# Patient Record
Sex: Female | Born: 1945 | Race: White | Hispanic: No | Smoking: Former smoker
Health system: Southern US, Community
[De-identification: ages and names within clinical notes are randomized; demographics above are authoritative.]

## PROBLEM LIST (undated history)

## (undated) DIAGNOSIS — Z9109 Other allergy status, other than to drugs and biological substances: Secondary | ICD-10-CM

## (undated) DIAGNOSIS — E785 Hyperlipidemia, unspecified: Secondary | ICD-10-CM

## (undated) DIAGNOSIS — F419 Anxiety disorder, unspecified: Secondary | ICD-10-CM

## (undated) DIAGNOSIS — M1990 Unspecified osteoarthritis, unspecified site: Secondary | ICD-10-CM

## (undated) DIAGNOSIS — I499 Cardiac arrhythmia, unspecified: Secondary | ICD-10-CM

## (undated) DIAGNOSIS — D649 Anemia, unspecified: Secondary | ICD-10-CM

## (undated) DIAGNOSIS — C801 Malignant (primary) neoplasm, unspecified: Secondary | ICD-10-CM

## (undated) DIAGNOSIS — Z87440 Personal history of urinary (tract) infections: Secondary | ICD-10-CM

## (undated) DIAGNOSIS — R011 Cardiac murmur, unspecified: Secondary | ICD-10-CM

## (undated) DIAGNOSIS — K219 Gastro-esophageal reflux disease without esophagitis: Secondary | ICD-10-CM

## (undated) DIAGNOSIS — R3989 Other symptoms and signs involving the genitourinary system: Secondary | ICD-10-CM

## (undated) HISTORY — DX: Anemia, unspecified: D64.9

## (undated) HISTORY — DX: Cardiac murmur, unspecified: R01.1

## (undated) HISTORY — PX: BREAST REDUCTION SURGERY: SHX8

## (undated) HISTORY — DX: Other symptoms and signs involving the genitourinary system: R39.89

## (undated) HISTORY — DX: Gastro-esophageal reflux disease without esophagitis: K21.9

## (undated) HISTORY — PX: FACIAL COSMETIC SURGERY: SHX629

## (undated) HISTORY — PX: SKIN SURGERY: SHX2413

## (undated) HISTORY — DX: Other allergy status, other than to drugs and biological substances: Z91.09

## (undated) HISTORY — DX: Hyperlipidemia, unspecified: E78.5

## (undated) HISTORY — DX: Unspecified osteoarthritis, unspecified site: M19.90

## (undated) HISTORY — DX: Anxiety disorder, unspecified: F41.9

## (undated) HISTORY — DX: Personal history of urinary (tract) infections: Z87.440

## (undated) HISTORY — DX: Malignant (primary) neoplasm, unspecified: C80.1

---

## 2008-01-20 LAB — HM PAP SMEAR: HM Pap smear: NEGATIVE

## 2009-08-23 ENCOUNTER — Ambulatory Visit: Payer: Self-pay | Admitting: Cardiovascular Disease

## 2009-08-23 DIAGNOSIS — R002 Palpitations: Secondary | ICD-10-CM | POA: Insufficient documentation

## 2009-08-23 DIAGNOSIS — E785 Hyperlipidemia, unspecified: Secondary | ICD-10-CM | POA: Insufficient documentation

## 2009-08-24 ENCOUNTER — Ambulatory Visit: Payer: Self-pay | Admitting: Cardiovascular Disease

## 2009-09-02 ENCOUNTER — Telehealth: Payer: Self-pay | Admitting: Cardiovascular Disease

## 2009-09-03 LAB — CONVERTED CEMR LAB
ALT: 15 units/L (ref 0–35)
Alkaline Phosphatase: 79 units/L (ref 39–117)
LDL Cholesterol: 140 mg/dL — ABNORMAL HIGH (ref 0–99)
MCHC: 31.3 g/dL (ref 30.0–36.0)
MCV: 96.4 fL (ref 78.0–100.0)
Platelets: 299 10*3/uL (ref 150–400)
Sodium: 138 meq/L (ref 135–145)
Total Bilirubin: 0.8 mg/dL (ref 0.3–1.2)
Total Protein: 6.7 g/dL (ref 6.0–8.3)
Triglycerides: 117 mg/dL (ref <150)
VLDL: 23 mg/dL (ref 0–40)

## 2009-10-07 ENCOUNTER — Encounter: Payer: Self-pay | Admitting: Cardiovascular Disease

## 2010-07-12 NOTE — Progress Notes (Signed)
Summary: RELEASE  RELEASE   Imported By: Frazier Butt Chriscoe 10/07/2009 11:43:23  _____________________________________________________________________  External Attachment:    Type:   Image     Comment:   External Document

## 2010-07-12 NOTE — Assessment & Plan Note (Signed)
Summary: NP6/AMD   Visit Type:  New Patient Referring Provider:  Mariah Milling Primary Provider:  Blythe Stanford  CC:  no complaints.  History of Present Illness: 65 year old woman with past medical history of hyperlipidemia, remote smoking who stopped 25 years ago, tachypalpitations who presents to reestablish care.  Ms. Saldierna states that she has been doing well. She has lost significant weight over the past month following any diet by Dr.Phil. She decreases her carbohydrates and increases her exercise. She rarely has tachypalpitations and has not had to take diltiazem since last year.  She is exercising frequently with no symptoms of shortness of breath, chest pain or neck tightness. Overall she feels well with no complaints.  Preventive Screening-Counseling & Management  Alcohol-Tobacco     Alcohol drinks/day: <1     Smoking Status: quit     Year Quit: 1990  Caffeine-Diet-Exercise     Caffeine use/day: 2-3 (green tea; coffee)     Does Patient Exercise: yes      Drug Use:  no.    Current Medications (verified): 1)  Zyrtec Hives Relief 10 Mg Tabs (Cetirizine Hcl) .Marland Kitchen.. 1 By Mouth Once Daily 2)  Alprazolam 0.5 Mg Tabs (Alprazolam) .... 1/2 By Mouth Once Daily 3)  Cinnamon 500 Mg Caps (Cinnamon) .Marland Kitchen.. 1 By Mouth Once Daily 4)  Cranberry Concentrate 100-3 Mg-Unit Caps (Vitamins C E) .Marland Kitchen.. 1 By Mouth Once Daily 5)  Acidophilus  Caps (Lactobacillus) .Marland Kitchen.. 1 By Mouth Once Daily 6)  Aspirin 81 Mg Tbec (Aspirin) .... Take 2  Tablet By Mouth Daily  Allergies (verified): No Known Drug Allergies  Past History:  Past Medical History: Murmur Hyperlipidemia Allergies Anxiety Arthritis GERD Anemia Constipation Urinary problems  Past Surgical History: face life breast reductione  Family History: Mother: deceased 40; stroke Father: deceased 15: heart problems Brother: living  Social History: Retired  Widowed  Tobacco Use - Former.  Alcohol Use - yes Regular Exercise -  yes Drug Use - no Alcohol drinks/day:  <1 Smoking Status:  quit Caffeine use/day:  2-3 (green tea; coffee) Does Patient Exercise:  yes Drug Use:  no  Review of Systems  The patient denies fever, vision loss, decreased hearing, hoarseness, chest pain, syncope, dyspnea on exertion, peripheral edema, prolonged cough, abdominal pain, incontinence, muscle weakness, depression, and enlarged lymph nodes.    Vital Signs:  Patient profile:   65 year old female Height:      63 inches Weight:      127.25 pounds BMI:     22.62 Pulse rate:   68 / minute Pulse rhythm:   regular BP sitting:   127 / 70  (right arm) Cuff size:   regular  Vitals Entered By: Mercer Pod (August 23, 2009 11:30 AM)  Physical Exam  General:  well-appearing middle-aged woman in no apparent distress, HENT exam is benign, oropharynx is clear, neck is supple with no JVP or carotid bruits,and no murmurs appreciated, lungs are clear to auscultation with no wheezes Rales, abdominal exam is benign, no significant marked edema, pulses are equal and symmetrical in her upper and lower extremities, neurologic exam is nonfocal, skin is warm and dry    EKG  Procedure date:  08/23/2009  Findings:      normal sinus rhythm with rate of 68 beats per minute, sinus arrhythmia, no significant ST or T wave changes.  Impression & Recommendations:  Problem # 1:  HYPERLIPIDEMIA (ICD-272.4) she does have a history of coronary artery disease. Her father had his first MI  at age 62, and died at age 28 from cardiac related complications. Mother had a stroke at age 61.  She currently takes aspirin 162 mg daily. We have asked her to have some lab work done this week and we will forward this to Dr. Elease Hashimoto and it becomes available  Problem # 2:  PALPITATIONS (ICD-785.1) no significant palpitations. She does take diltiazem on an as-needed basis though it does not take it regularly. This does not seem to be significant concern and she  does not have significant symptoms at this time. We will continue her diltiazem as her last prescription is 65 years old.  Her updated medication list for this problem includes:    Aspirin 81 Mg Tbec (Aspirin) .Marland Kitchen... Take 2  tablet by mouth daily    Diltiazem Hcl 30 Mg Tabs (Diltiazem hcl) .Marland Kitchen... 1 tab by mouth as needed for palpitations  Patient Instructions: 1)  Your physician recommends that you schedule a follow-up appointment in: 1 year 2)  Your physician recommends that you return for a FASTING lipid profile: lipids, CMET, CBC, A1C at your earliest convenience Prescriptions: DILTIAZEM HCL 30 MG TABS (DILTIAZEM HCL) 1 tab by mouth as needed for palpitations  #30 x 1   Entered by:   Charlena Cross, RN, BSN   Authorized by:   Dossie Arbour MD   Signed by:   Charlena Cross, RN, BSN on 08/23/2009   Method used:   Electronically to        [facility]* (retail)       [address]       [place], [place]  [address]       Ph: [phone]       Fax: [phone]   RxID:   [id number]

## 2010-07-12 NOTE — Progress Notes (Signed)
Summary: PHI  PHI   Imported By: Harlon Flor 08/23/2009 15:55:07  _____________________________________________________________________  External Attachment:    Type:   Image     Comment:   External Document

## 2010-07-12 NOTE — Progress Notes (Signed)
Summary: LAB WORK  Phone Note Call from Patient Call back at [phone]   Caller: SELF Call For: [facility] Summary of Call: CALLING FOR BLOOD RESULTS FROM LAST WEEK Initial call taken by: Harlon Flor,  September 02, 2009 9:20 AM  Follow-up for Phone Call        pt given results. Charlena Cross RN BSN  Follow-up by: Charlena Cross, RN, BSN,  September 03, 2009 9:33 AM

## 2010-12-27 ENCOUNTER — Encounter: Payer: Self-pay | Admitting: Cardiovascular Disease

## 2012-01-23 ENCOUNTER — Encounter: Payer: Self-pay | Admitting: Cardiovascular Disease

## 2012-01-23 ENCOUNTER — Ambulatory Visit (INDEPENDENT_AMBULATORY_CARE_PROVIDER_SITE_OTHER): Payer: Medicare Other | Admitting: Cardiovascular Disease

## 2012-01-23 VITALS — BP 120/74 | HR 59 | Ht 63.0 in | Wt 130.5 lb

## 2012-01-23 DIAGNOSIS — Z79899 Other long term (current) drug therapy: Secondary | ICD-10-CM

## 2012-01-23 DIAGNOSIS — R5381 Other malaise: Secondary | ICD-10-CM

## 2012-01-23 DIAGNOSIS — R002 Palpitations: Secondary | ICD-10-CM

## 2012-01-23 DIAGNOSIS — R5383 Other fatigue: Secondary | ICD-10-CM

## 2012-01-23 DIAGNOSIS — R079 Chest pain, unspecified: Secondary | ICD-10-CM | POA: Insufficient documentation

## 2012-01-23 DIAGNOSIS — E785 Hyperlipidemia, unspecified: Secondary | ICD-10-CM

## 2012-01-23 NOTE — Assessment & Plan Note (Signed)
We will recheck her cholesterol today. We have suggested red yeast rice. She does not want a statin.

## 2012-01-23 NOTE — Progress Notes (Signed)
Patient ID: Isabel Gonzalez, female    DOB: [birth year], 66 y.o.   MRN: [id number]  HPI Comments: 66 year old woman with past medical history of hyperlipidemia, remote smoking who stopped 25 years ago, tachypalpitations who presents for routine followup.   Isabel Gonzalez states that she has been doing well.. She rarely has tachypalpitations and takes diltiazem rarely. She has been having some chest pain episodes on the left that start when she begins exercise. They go away after several minutes and she is able to exercise for prolonged period of time with no symptoms. She has started a proton pump inhibitor in the past several days. She has been walking for the past several weeks and prior to that was sedentary with mild weight gain. She does have significant arthritis  EKG shows normal sinus rhythm with rate 59 beats per minute with no significant ST or T wave changes    Outpatient Encounter Prescriptions as of 01/23/2012  Medication Sig Dispense Refill  . ALPRAZolam (XANAX) 0.5 MG tablet Take 0.25 mg by mouth daily as needed.       Marland Kitchen aspirin 81 MG EC tablet Take 162 mg by mouth daily.        . cetirizine (ZYRTEC) 10 MG tablet Take 10 mg by mouth daily.        . Cinnamon 500 MG capsule Take 500 mg by mouth daily.        Marland Kitchen CRANBERRY PO Take by mouth daily.      Marland Kitchen diltiazem (CARDIZEM) 30 MG tablet Take 30 mg by mouth as needed.       . fluticasone (FLONASE) 50 MCG/ACT nasal spray Place 2 sprays into the nose as needed.      . Lactobacillus (ACIDOPHILUS) CAPS Take 1 capsule by mouth daily.        Marland Kitchen MAGNESIUM CARBONATE PO Take by mouth as needed.      . Melatonin 5 MG TABS Take 5 mg by mouth daily.      . Psyllium (METAMUCIL PO) Take by mouth daily.      . Vitamins C E (CRANBERRY CONCENTRATE) 100-3 MG-UNIT CAPS Take 1 capsule by mouth daily.          Review of Systems  Constitutional: Negative.   HENT: Negative.   Eyes: Negative.   Respiratory: Negative.   Cardiovascular: Positive for  chest pain and palpitations.  Gastrointestinal: Negative.   Musculoskeletal: Negative.   Skin: Negative.   Neurological: Negative.   Hematological: Negative.   Psychiatric/Behavioral: Negative.   All other systems reviewed and are negative.    BP 120/74  Pulse 59  Ht 5\' 3"  (1.6 m)  Wt 130 lb 8 oz (59.194 kg)  BMI 23.12 kg/m2  Physical Exam  Nursing note and vitals reviewed. Constitutional: She is oriented to person, place, and time. She appears well-developed and well-nourished.  HENT:  Head: Normocephalic.  Nose: Nose normal.  Mouth/Throat: Oropharynx is clear and moist.  Eyes: Conjunctivae are normal. Pupils are equal, round, and reactive to light.  Neck: Normal range of motion. Neck supple. No JVD present.  Cardiovascular: Normal rate, regular rhythm, S1 normal, S2 normal, normal heart sounds and intact distal pulses.  Exam reveals no gallop and no friction rub.   No murmur heard. Pulmonary/Chest: Effort normal and breath sounds normal. No respiratory distress. She has no wheezes. She has no rales. She exhibits no tenderness.  Abdominal: Soft. Bowel sounds are normal. She exhibits no distension. There is no tenderness.  Musculoskeletal: Normal  range of motion. She exhibits no edema and no tenderness.  Lymphadenopathy:    She has no cervical adenopathy.  Neurological: She is alert and oriented to person, place, and time. Coordination normal.  Skin: Skin is warm and dry. No rash noted. No erythema.  Psychiatric: She has a normal mood and affect. Her behavior is normal. Judgment and thought content normal.         Assessment and Plan

## 2012-01-23 NOTE — Assessment & Plan Note (Signed)
Rare palpitations.

## 2012-01-23 NOTE — Patient Instructions (Addendum)
You are doing well. No medication changes were made.  Try Red Yeast Rice, plant sterols Please call the office if you have more chest pain  Please call us if you have new issues that need to be addressed before your next appt.  Your physician wants you to follow-up in: 12 months.  You will receive a reminder letter in the mail two months in advance. If you don't receive a letter, please call our office to schedule the follow-up appointment.

## 2012-01-23 NOTE — Assessment & Plan Note (Addendum)
We have suggested that she call our office if she has worsening left-sided chest pain. We will do a routine treadmill study. Symptoms are atypical.

## 2012-01-24 LAB — BASIC METABOLIC PANEL
BUN/Creatinine Ratio: 17 (ref 11–26)
BUN: 11 mg/dL (ref 8–27)
CO2: 25 mmol/L (ref 19–28)
Calcium: 9.8 mg/dL (ref 8.6–10.2)
Chloride: 106 mmol/L (ref 97–108)
Creatinine, Ser: 0.66 mg/dL (ref 0.57–1.00)
Glucose: 101 mg/dL — ABNORMAL HIGH (ref 65–99)

## 2012-01-24 LAB — CBC
MCH: 31.8 pg (ref 26.6–33.0)
Platelets: 298 10*3/uL (ref 140–415)
RBC: 4.53 x10E6/uL (ref 3.77–5.28)

## 2012-01-24 LAB — HEPATIC FUNCTION PANEL
ALT: 25 IU/L (ref 0–32)
AST: 24 IU/L (ref 0–40)
Albumin: 4.6 g/dL (ref 3.6–4.8)
Alkaline Phosphatase: 70 IU/L (ref 47–112)
Total Bilirubin: 0.9 mg/dL (ref 0.0–1.2)

## 2012-01-24 LAB — LIPID PANEL
Cholesterol, Total: 257 mg/dL — ABNORMAL HIGH (ref 100–199)
Triglycerides: 159 mg/dL — ABNORMAL HIGH (ref 0–149)
VLDL Cholesterol Cal: 32 mg/dL (ref 5–40)

## 2012-01-26 ENCOUNTER — Telehealth: Payer: Self-pay | Admitting: *Deleted

## 2012-01-26 ENCOUNTER — Ambulatory Visit: Payer: Self-pay | Admitting: Family Medicine

## 2012-01-26 MED ORDER — ATORVASTATIN CALCIUM 10 MG PO TABS: 10.0000 mg | ORAL_TABLET | Freq: Every day | ORAL | Status: DC

## 2012-01-26 NOTE — Telephone Encounter (Signed)
Pt aware of lab results.  She does not want to take the red yeast rice " because my friends say it has given them hot flashes and I am over that!" She states she would be willing to take a statin. Dr. Mariah Milling reviewed and recommended Atorvastatin 10mg   With fasting labs in 3 months. Pt agrees with this and will take medication at bedtime. She will call back if any symptoms occur. Mylo Red RN

## 2012-01-26 NOTE — Telephone Encounter (Signed)
Message copied by Barrie Folk on Fri Jan 26, 2012  4:49 PM ------      Message from: Antonieta Iba      Created: Fri Jan 26, 2012  1:28 PM       All lab work looks good except for very high cholesterol      Needs a statin but has refused in the past      At least high dose red yeast rice,      May have to add other non-statin medications (zetia, fenofibrate, welchol, etc)

## 2012-04-29 ENCOUNTER — Ambulatory Visit: Payer: Self-pay | Admitting: Gastroenterology

## 2012-04-29 LAB — HM COLONOSCOPY

## 2013-01-06 ENCOUNTER — Ambulatory Visit: Payer: Self-pay | Admitting: Gastroenterology

## 2013-01-17 ENCOUNTER — Ambulatory Visit: Payer: Self-pay | Admitting: Gastroenterology

## 2013-01-17 ENCOUNTER — Encounter: Payer: Self-pay | Admitting: Cardiovascular Disease

## 2013-01-17 ENCOUNTER — Ambulatory Visit (INDEPENDENT_AMBULATORY_CARE_PROVIDER_SITE_OTHER): Payer: Medicare Other | Admitting: Cardiovascular Disease

## 2013-01-17 VITALS — BP 142/84 | HR 55 | Ht 63.0 in | Wt 129.2 lb

## 2013-01-17 DIAGNOSIS — R109 Unspecified abdominal pain: Secondary | ICD-10-CM | POA: Insufficient documentation

## 2013-01-17 DIAGNOSIS — E785 Hyperlipidemia, unspecified: Secondary | ICD-10-CM

## 2013-01-17 DIAGNOSIS — R079 Chest pain, unspecified: Secondary | ICD-10-CM

## 2013-01-17 DIAGNOSIS — R002 Palpitations: Secondary | ICD-10-CM

## 2013-01-17 MED ORDER — DILTIAZEM HCL 30 MG PO TABS: 30.0000 mg | ORAL_TABLET | Freq: Three times a day (TID) | ORAL | Status: DC | PRN

## 2013-01-17 NOTE — Assessment & Plan Note (Signed)
She takes diltiazem very sparingly for episodes of palpitations

## 2013-01-17 NOTE — Assessment & Plan Note (Signed)
She reports chronic abdominal discomfort. 2 CTs of her abdomen recently both showing her mesenteric vessels are open. Mild to moderate aortic plaquing. Atherosclerosis not likely contributing to her abdominal discomfort. This is a incidental finding that will help guide her cholesterol management.

## 2013-01-17 NOTE — Assessment & Plan Note (Signed)
CT scan from [facility] was evaluated. Images were visualized and shown to the patient. She was able to see her aortic plaque. She has not been able to tolerate Lipitor or Crestor. We will try vytorin 10/20 mg one half pill for several weeks before titrating up to a full pill.

## 2013-01-17 NOTE — Patient Instructions (Addendum)
You are doing well.  Please start vytorin 10/20 daily (take 1/2 pill daily for a few weeks) If no side effects, increase to a full pill  We will recheck cholesterol in three months  Please call us if you have new issues that need to be addressed before your next appt.  Your physician wants you to follow-up in: 12 months.  You will receive a reminder letter in the mail two months in advance. If you don't receive a letter, please call our office to schedule the follow-up appointment.

## 2013-01-17 NOTE — Progress Notes (Signed)
Patient ID: Isabel Gonzalez, female    DOB: 1945-12-18, 67 y.o.   MRN: [id number]  HPI Comments: 67 year old woman with past medical history of hyperlipidemia, remote smoking who stopped 25 years ago, tachypalpitations who presents for routine followup.   Isabel Gonzalez states that she has been doing well. She rarely has tachypalpitations and takes diltiazem rarely. She does report having 3 episodes of severe nausea vomiting and diarrhea, each episode occurred after eating out   at a restaurant, and eating fast . Symptoms resolved after one to 3 hours . She reports having EGD and colonoscopy November 2013 which by her report was benign. Since that time, she has had an aching in her abdomen that seems to wax and wane .  She was sent for CT scan of her chest abdomen pelvis.   CT scan 01/17/2013 showed mild to moderate descending aortic atherosclerotic plaque otherwise mesenteric vessels for open  CT scan of abdomen pelvis with contrast at [facility] also documented the same thing. This was done in on 01/13/2013 (this was done for right upper part her pain) She states that she has had ultrasound of her gallbladder. Initially symptoms were felt to be from gallstones. She has seen Dr. Mechele Collin and reports that they do not think she has gallstones  EKG shows normal sinus rhythm with rate 55 beats per minute with no significant ST or T wave changes, PVC noted     Outpatient Encounter Prescriptions as of 01/17/2013  Medication Sig Dispense Refill  . ALPRAZolam (XANAX) 0.5 MG tablet Take 0.25 mg by mouth daily as needed.       Marland Kitchen aspirin 81 MG EC tablet Take 162 mg by mouth daily.        . cetirizine (ZYRTEC) 10 MG tablet Take 10 mg by mouth daily.        . Cinnamon 500 MG capsule Take 1,000 mg by mouth daily.       Marland Kitchen CRANBERRY PO Take by mouth daily.      Marland Kitchen diltiazem (CARDIZEM) 30 MG tablet Take 30 mg by mouth as needed.       . fluticasone (FLONASE) 50 MCG/ACT nasal spray Place 2 sprays into the nose as  needed.      . Melatonin 5 MG TABS Take 5 mg by mouth daily.      . Vitamins C E (CRANBERRY CONCENTRATE) 100-3 MG-UNIT CAPS Take 1 capsule by mouth daily.        . [DISCONTINUED] atorvastatin (LIPITOR) 10 MG tablet Take 1 tablet (10 mg total) by mouth daily.  30 tablet  6  . [DISCONTINUED] Lactobacillus (ACIDOPHILUS) CAPS Take 1 capsule by mouth daily.        . [DISCONTINUED] MAGNESIUM CARBONATE PO Take by mouth as needed.      . [DISCONTINUED] Psyllium (METAMUCIL PO) Take by mouth daily.       No facility-administered encounter medications on file as of 01/17/2013.    Review of Systems  Constitutional: Negative.   HENT: Negative.   Eyes: Negative.   Respiratory: Negative.   Cardiovascular: Negative.   Gastrointestinal: Positive for abdominal pain.  Musculoskeletal: Negative.   Skin: Negative.   Neurological: Negative.   Psychiatric/Behavioral: Negative.   All other systems reviewed and are negative.    BP 142/84  Pulse 55  Ht 5\' 3"  (1.6 m)  Wt 129 lb 4 oz (58.627 kg)  BMI 22.9 kg/m2  Physical Exam  Nursing note and vitals reviewed. Constitutional: She is oriented to  person, place, and time. She appears well-developed and well-nourished.  HENT:  Head: Normocephalic.  Nose: Nose normal.  Mouth/Throat: Oropharynx is clear and moist.  Eyes: Conjunctivae are normal. Pupils are equal, round, and reactive to light.  Neck: Normal range of motion. Neck supple. No JVD present.  Cardiovascular: Normal rate, regular rhythm, S1 normal, S2 normal, normal heart sounds and intact distal pulses.  Exam reveals no gallop and no friction rub.   No murmur heard. Pulmonary/Chest: Effort normal and breath sounds normal. No respiratory distress. She has no wheezes. She has no rales. She exhibits no tenderness.  Abdominal: Soft. Bowel sounds are normal. She exhibits no distension. There is no tenderness.  Musculoskeletal: Normal range of motion. She exhibits no edema and no tenderness.   Lymphadenopathy:    She has no cervical adenopathy.  Neurological: She is alert and oriented to person, place, and time. Coordination normal.  Skin: Skin is warm and dry. No rash noted. No erythema.  Psychiatric: She has a normal mood and affect. Her behavior is normal. Judgment and thought content normal.    Assessment and Plan

## 2013-04-14 ENCOUNTER — Telehealth: Payer: Self-pay | Admitting: *Deleted

## 2013-04-14 DIAGNOSIS — E785 Hyperlipidemia, unspecified: Secondary | ICD-10-CM

## 2013-04-14 NOTE — Telephone Encounter (Signed)
She has tried Crestor, Lipitor, Vytorin with side effects  Could try pravastatin 20 mg daily Cut the pill in half to start

## 2013-04-14 NOTE — Telephone Encounter (Signed)
Tried the statins that Dr. Mariah Milling gave her and they made her feel bad, achy and tired. Please call and advise.

## 2013-04-15 MED ORDER — PRAVASTATIN SODIUM 20 MG PO TABS: 20.0000 mg | ORAL_TABLET | Freq: Every evening | ORAL | Status: DC

## 2013-04-15 NOTE — Telephone Encounter (Signed)
Spoke w/ pt.  She is very interested in trying pravastatin, as a friend of her tried it with success. Informed her that i was sending rx for pravastatin 20 mg to [facility] and for her to cut in 1/2, see how she does and move up to a whole pill. Pt to call back and let me know how this is working for her.

## 2013-04-17 ENCOUNTER — Other Ambulatory Visit: Payer: Self-pay

## 2013-04-24 ENCOUNTER — Ambulatory Visit: Payer: Self-pay | Admitting: Gastroenterology

## 2013-12-10 LAB — BASIC METABOLIC PANEL
BUN: 14 mg/dL (ref 4–21)
Creatinine: 0.6 mg/dL (ref 0.5–1.1)
GLUCOSE: 95 mg/dL
POTASSIUM: 4.8 mmol/L (ref 3.4–5.3)
SODIUM: 139 mmol/L (ref 137–147)

## 2013-12-10 LAB — HEPATIC FUNCTION PANEL
ALT: 21 U/L (ref 7–35)
AST: 21 U/L (ref 13–35)

## 2013-12-10 LAB — HEMOGLOBIN A1C: Hgb A1c MFr Bld: 5.8 % (ref 4.0–6.0)

## 2014-11-12 ENCOUNTER — Telehealth: Payer: Self-pay | Admitting: Family Medicine

## 2014-11-12 NOTE — Telephone Encounter (Signed)
[place] 11/02/2014 and broke your humerus/right arm.  Pt is a doctors name to see a orthopedic doctor here.  Pt states she just needs a doctor name and # that she can give [place] doctor [place] the beach if possible.  CB#[phone]

## 2014-11-12 NOTE — Telephone Encounter (Signed)
Left message to call back. Do not understand message.

## 2014-12-07 ENCOUNTER — Ambulatory Visit: Payer: Medicare PPO

## 2014-12-08 ENCOUNTER — Ambulatory Visit: Payer: Medicare PPO | Attending: Orthopedic Surgery | Admitting: Occupational Therapy

## 2014-12-08 ENCOUNTER — Encounter: Payer: Self-pay | Admitting: Occupational Therapy

## 2014-12-08 DIAGNOSIS — M2560 Stiffness of unspecified joint, not elsewhere classified: Secondary | ICD-10-CM | POA: Diagnosis present

## 2014-12-08 DIAGNOSIS — IMO0002 Reserved for concepts with insufficient information to code with codable children: Secondary | ICD-10-CM

## 2014-12-08 DIAGNOSIS — R609 Edema, unspecified: Secondary | ICD-10-CM | POA: Insufficient documentation

## 2014-12-08 NOTE — Therapy (Signed)
[facility] [address], [place], [address] Phone: [phone]   Fax:  [phone]  Occupational Therapy Treatment  Patient Details  Name: Isabel Gonzalez MRN: [id number] Date of Birth: [birth year] Referring Provider:  Justice Britain, MD  Encounter Date: 12/08/2014      OT End of Session - 12/08/14 2046    Visit Number 1   Number of Visits 1   Date for OT Re-Evaluation 12/08/14   OT Start Time 1300   OT Stop Time 1358   OT Time Calculation (min) 58 min   Activity Tolerance Patient tolerated treatment well   Behavior During Therapy [facility] for tasks assessed/performed      Past Medical History  Diagnosis Date  . Murmur   . Hyperlipidemia   . Environmental allergies   . Anxiety   . Arthritis   . GERD (gastroesophageal reflux disease)   . Anemia   . Constipation   . Urinary problem   . Cancer     squamous cell  . History of bladder infections     Past Surgical History  Procedure Laterality Date  . Facial cosmetic surgery    . Breast reduction surgery    . Skin surgery      squamous cell    There were no vitals filed for this visit.  Visit Diagnosis:  Edema - Plan: Ot plan of care cert/re-cert  Stiffness of extremity - Plan: Ot plan of care cert/re-cert      Subjective Assessment - 12/08/14 2033    Subjective  I fractured my humerus 11/02/14 and in sling - hard time getting my bra on , slling myself- open jar, writing, cannot drive- swelling in hand to elbow was worse when I seen him    Patient Stated Goals Want to do things for myself- use my arm - get the swelling in my hand to elbow better that I can use it more             [facility] OT Assessment - 12/08/14 0001    Assessment   Diagnosis Closed nondisplaced spriral humerus sharft fx   Onset Date 11/02/14   Assessment Pt refer to ADL's retrng and edema management for R dominant hand to elbow - pt report needing assistance for  putting on bra, shirt, sling,, open jars, writing , shower and putting on toothbrush  - she do have min to mod edema in R hand and wrist - moderate at elbow with some fibrosis present because of long standing edema -   Precautions   Precaution Comments sling can dangle arm out of brace to use in ADL's    Required Braces or Orthoses Sling;Other Brace/Splint   Restrictions   Other Position/Activity Restrictions NWB on R UE    Home  Environment   Lives With Alone   Prior Function   Vocation Retired   Leisure Very active - walked daily 3 miles, play bridge, golf, reading , sudoku , - had normal AROM prior to fall    ADL   Upper Body Dressing Patient Percentage 30%   Lower Body Dressing Independent   ADL comments Shower standing in walk in shower - recommend shower chair - she has hand held shower    Edema   Edema moderate at elbow , and min to mod at hand and wrist      Pt was educated in donning sling, brace and shirt one hand technique - moved velcro to  anterior for her to reach better  And HEP for AROM to wrist, tendon gliding and opposition ed on   Edema control - she can do manual lymph drainage tech from wrist to elbow, hand to elbow and then finger to elbow  Isotoner glove fitted and bandage one 6 cm short stretch bandage to do from hand to elbow about 2-3 x day for 2 hrs                      OT Education - 12/08/14 2045    Education provided Yes   Education Details HEP and ADL's    Person(s) Educated Patient   Methods Explanation;Demonstration;Verbal cues;Handout   Comprehension Verbalized understanding;Returned demonstration;Verbal cues required          OT Short Term Goals - 12/08/14 2054    OT SHORT TERM GOAL #1   Title Pt to demo understanding of HEP for decreasing edema and increase ROM at R wrist and digits    Status Achieved           OT Long Term Goals - 12/08/14 2056    OT LONG TERM GOAL #1   Title Pt to be modified ind in donning  shirt  and brace/sling    Status Achieved               Plan - 12/08/14 2047    Clinical Impression Statement Pt was  evaluated and educated on edema control techniques for R  elbow to hand edema , educated on AROM to hand and wrist  as well as ADL's retrng and pt was modified ind in UB dressing at end of session and  edema control and AROM to wrist/fingers    Pt will benefit from skilled therapeutic intervention in order to improve on the following deficits (Retired) Impaired flexibility;Decreased mobility;Impaired UE functional use   Rehab Potential Good   OT Frequency One time visit   OT Treatment/Interventions Self-care/ADL training;Manual Therapy;Compression bandaging;Patient/family education;Therapeutic exercise   OT Home Exercise Plan see pt instruction   Consulted and Agree with Plan of Care Patient        Problem List Patient Active Problem List   Diagnosis Date Noted  . Abdominal discomfort 01/17/2013  . Chest pain 01/23/2012  . HYPERLIPIDEMIA 08/23/2009  . PALPITATIONS 08/23/2009    Rosalyn Gess OTR/L, CLT 12/08/2014, 9:01 PM  [facility] [address] [place] [address] [place], [address] Phone: [phone]   Fax:  [phone]

## 2014-12-08 NOTE — Patient Instructions (Addendum)
Pt educated on modify independent donning sling and brace - moved velcro Also donning and doffing shirt   AROM wrist in all planes with forearm supported  Tendon gliding and opposition   Elbow  extention AROM against wall with shoulder in brace and against wall to prevent shoulder ROM  -after warm shower - gentle   Ed on edema massage of forearm distal to proximal  Then hand to elbow , then fingers to elbow   Fitted with isotoner glove to wear at night time and some during day   And gentle compression wrap with  6 cm short stretch bandage from hand to elbow - 2-3 x day for about 2hrs

## 2014-12-17 ENCOUNTER — Ambulatory Visit: Payer: Medicare PPO | Admitting: Occupational Therapy

## 2015-02-16 ENCOUNTER — Other Ambulatory Visit: Payer: Self-pay | Admitting: Family Medicine

## 2015-02-16 MED ORDER — CIPROFLOXACIN HCL 500 MG PO TABS: 500.0000 mg | ORAL_TABLET | Freq: Two times a day (BID) | ORAL | Status: DC

## 2015-03-18 DIAGNOSIS — F411 Generalized anxiety disorder: Secondary | ICD-10-CM | POA: Insufficient documentation

## 2015-03-18 DIAGNOSIS — R109 Unspecified abdominal pain: Secondary | ICD-10-CM | POA: Insufficient documentation

## 2015-03-18 DIAGNOSIS — Z6823 Body mass index (BMI) 23.0-23.9, adult: Secondary | ICD-10-CM | POA: Insufficient documentation

## 2015-03-18 DIAGNOSIS — R739 Hyperglycemia, unspecified: Secondary | ICD-10-CM | POA: Insufficient documentation

## 2015-03-18 DIAGNOSIS — R002 Palpitations: Secondary | ICD-10-CM | POA: Insufficient documentation

## 2015-03-18 DIAGNOSIS — H409 Unspecified glaucoma: Secondary | ICD-10-CM | POA: Insufficient documentation

## 2015-03-18 DIAGNOSIS — K219 Gastro-esophageal reflux disease without esophagitis: Secondary | ICD-10-CM | POA: Insufficient documentation

## 2015-03-18 DIAGNOSIS — M719 Bursopathy, unspecified: Secondary | ICD-10-CM | POA: Insufficient documentation

## 2015-03-18 DIAGNOSIS — E7800 Pure hypercholesterolemia, unspecified: Secondary | ICD-10-CM | POA: Insufficient documentation

## 2015-03-18 DIAGNOSIS — M1990 Unspecified osteoarthritis, unspecified site: Secondary | ICD-10-CM | POA: Insufficient documentation

## 2015-03-18 DIAGNOSIS — Z833 Family history of diabetes mellitus: Secondary | ICD-10-CM | POA: Insufficient documentation

## 2015-03-18 DIAGNOSIS — J309 Allergic rhinitis, unspecified: Secondary | ICD-10-CM | POA: Insufficient documentation

## 2015-03-18 DIAGNOSIS — F419 Anxiety disorder, unspecified: Secondary | ICD-10-CM | POA: Insufficient documentation

## 2015-03-18 DIAGNOSIS — G43909 Migraine, unspecified, not intractable, without status migrainosus: Secondary | ICD-10-CM | POA: Insufficient documentation

## 2015-03-19 ENCOUNTER — Encounter: Payer: Self-pay | Admitting: Family Medicine

## 2015-03-19 ENCOUNTER — Ambulatory Visit (INDEPENDENT_AMBULATORY_CARE_PROVIDER_SITE_OTHER): Payer: Medicare PPO | Admitting: Family Medicine

## 2015-03-19 VITALS — BP 100/60 | HR 74 | Temp 97.5°F | Resp 16 | Ht 62.0 in | Wt 128.0 lb

## 2015-03-19 DIAGNOSIS — S42301G Unspecified fracture of shaft of humerus, right arm, subsequent encounter for fracture with delayed healing: Secondary | ICD-10-CM | POA: Diagnosis not present

## 2015-03-19 DIAGNOSIS — R202 Paresthesia of skin: Secondary | ICD-10-CM | POA: Diagnosis not present

## 2015-03-19 DIAGNOSIS — R739 Hyperglycemia, unspecified: Secondary | ICD-10-CM

## 2015-03-19 DIAGNOSIS — Z13820 Encounter for screening for osteoporosis: Secondary | ICD-10-CM

## 2015-03-19 DIAGNOSIS — S42309A Unspecified fracture of shaft of humerus, unspecified arm, initial encounter for closed fracture: Secondary | ICD-10-CM | POA: Insufficient documentation

## 2015-03-19 DIAGNOSIS — Z0000 Encounter for general adult medical examination without abnormal findings: Secondary | ICD-10-CM | POA: Diagnosis not present

## 2015-03-19 DIAGNOSIS — E7800 Pure hypercholesterolemia, unspecified: Secondary | ICD-10-CM

## 2015-03-19 NOTE — Progress Notes (Signed)
Patient ID: Isabel Gonzalez, female   DOB: [birth year], 69 y.o.   MRN: [id number]        Patient: Isabel Gonzalez, Female    DOB: 08-31-1945, 69 y.o.   MRN: [id number] Visit Date: 03/19/2015  Today's Provider: Margarita Rana, MD   Chief Complaint  Patient presents with  . Medicare Wellness   Subjective:    Annual wellness visit Isabel Gonzalez is a 69 y.o. female. She feels well. She reports exercising none. She reports she is sleeping fairly well.  Patient reports right arm pain due to fracture.  12/10/13 CPE 01/20/08 Pap-neg 04/29/12 Colonoscopy-diverticulosis 12/24/03 EKG  Lab Results  Component Value Date   WBC 3.8* 01/23/2012   HGB 14.4 01/23/2012   HCT 43.3 01/23/2012   PLT 298 01/23/2012   GLUCOSE 101* 01/23/2012   CHOL 257* 01/23/2012   TRIG 159* 01/23/2012   HDL 61 01/23/2012   LDLCALC 164* 01/23/2012   ALT 21 12/10/2013   AST 21 12/10/2013   NA 139 12/10/2013   K 4.8 12/10/2013   CL 106 01/23/2012   CREATININE 0.6 12/10/2013   BUN 14 12/10/2013   CO2 25 01/23/2012   HGBA1C 5.8 12/10/2013    -----------------------------------------------------------   Review of Systems  Constitutional: Negative.   HENT: Negative.   Eyes: Negative.   Respiratory: Negative.   Cardiovascular: Negative.   Gastrointestinal: Negative.   Endocrine: Negative.   Genitourinary: Negative.   Musculoskeletal: Positive for joint swelling.  Allergic/Immunologic: Positive for environmental allergies.  Neurological: Positive for numbness.  Hematological: Negative.   Psychiatric/Behavioral: Negative.     Social History   Social History  . Marital Status: Widowed    Spouse Name: N/A  . Number of Children: N/A  . Years of Education: N/A   Occupational History  . retired    Social History Main Topics  . Smoking status: Former Smoker -- 0.25 packs/day for 20 years    Quit date: 01/17/2013  . Smokeless tobacco: Never Used  . Alcohol Use: 1.2 oz/week      2 Glasses of wine per week     Comment: ocassionally  . Drug Use: No  . Sexual Activity: Not on file   Other Topics Concern  . Not on file   Social History Narrative   Regular exercise          Patient Active Problem List   Diagnosis Date Noted  . Abdominal pain 03/18/2015  . Allergic rhinitis 03/18/2015  . Anxiety 03/18/2015  . Arthritis 03/18/2015  . Body mass index (BMI) of 23.0-23.9 in adult 03/18/2015  . Bursitis 03/18/2015  . Family history of diabetes mellitus 03/18/2015  . Acid reflux 03/18/2015  . Glaucoma 03/18/2015  . Hypercholesteremia 03/18/2015  . Blood glucose elevated 03/18/2015  . Headache, migraine 03/18/2015  . Awareness of heartbeats 03/18/2015  . Abdominal discomfort 01/17/2013  . Chest pain 01/23/2012  . HYPERLIPIDEMIA 08/23/2009  . PALPITATIONS 08/23/2009    Past Surgical History  Procedure Laterality Date  . Facial cosmetic surgery    . Breast reduction surgery    . Skin surgery      squamous cell    Her family history includes Diabetes in her brother; Stroke (age of onset: 44) in her mother.    Previous Medications   ALPRAZOLAM (XANAX) 0.5 MG TABLET    Take 0.25 mg by mouth daily as needed.    ASPIRIN 81 MG EC TABLET    Take 162 mg by mouth daily.  CALCIUM-VITAMIN D (CALTRATE 600 PLUS-VIT D PO)    Take 1 tablet by mouth 2 (two) times daily.   CETIRIZINE (ZYRTEC) 10 MG TABLET    Take 10 mg by mouth daily.     DILTIAZEM (CARDIZEM) 30 MG TABLET    Take 1 tablet (30 mg total) by mouth 3 (three) times daily as needed.   FLUTICASONE (FLONASE) 50 MCG/ACT NASAL SPRAY    Place 2 sprays into the nose as needed.   IBUPROFEN (ADVIL,MOTRIN) 200 MG TABLET    Take 200 mg by mouth every 8 (eight) hours as needed.   VITAMIN D, CHOLECALCIFEROL, 1000 UNITS TABS    Take 1 tablet by mouth 2 (two) times daily.   VITAMIN D, ERGOCALCIFEROL, (DRISDOL) 50000 UNITS CAPS CAPSULE    Take 50,000 Units by mouth every 7 (seven) days.   VITAMINS C E (CRANBERRY  CONCENTRATE) 100-3 MG-UNIT CAPS    Take 1 capsule by mouth daily.      Patient Care Team: Margarita Rana, MD as PCP - General (Family Medicine)     Objective:   Vitals: BP 100/60 mmHg  Pulse 74  Temp(Src) 97.5 F (36.4 C) (Oral)  Resp 16  Ht 5\' 2"  (1.575 m)  Wt 128 lb (58.06 kg)  BMI 23.41 kg/m2  Physical Exam  Constitutional: She is oriented to person, place, and time. She appears well-developed and well-nourished.  HENT:  Head: Normocephalic and atraumatic.  Right Ear: Tympanic membrane, external ear and ear canal normal.  Left Ear: Tympanic membrane, external ear and ear canal normal.  Nose: Nose normal.  Mouth/Throat: Uvula is midline, oropharynx is clear and moist and mucous membranes are normal.  Eyes: Conjunctivae, EOM and lids are normal. Pupils are equal, round, and reactive to light.  Neck: Trachea normal and normal range of motion. Neck supple. Carotid bruit is not present. No thyroid mass and no thyromegaly present.  Cardiovascular: Normal rate, regular rhythm and normal heart sounds.   Pulmonary/Chest: Effort normal and breath sounds normal.  Abdominal: Soft. Normal appearance and bowel sounds are normal. There is no hepatosplenomegaly. There is no tenderness.  Genitourinary: No breast swelling, tenderness or discharge.  Musculoskeletal: Normal range of motion.  Lymphadenopathy:    She has no cervical adenopathy.    She has no axillary adenopathy.  Neurological: She is alert and oriented to person, place, and time. She has normal strength. No cranial nerve deficit.  Skin: Skin is warm, dry and intact.  Psychiatric: She has a normal mood and affect. Her speech is normal and behavior is normal. Judgment and thought content normal. Cognition and memory are normal.    Activities of Daily Living In your present state of health, do you have any difficulty performing the following activities: 03/19/2015  Hearing? N  Vision? N  Difficulty concentrating or making  decisions? N  Walking or climbing stairs? N  Dressing or bathing? N  Doing errands, shopping? N    Fall Risk Assessment Fall Risk  03/19/2015  Falls in the past year? Yes  Number falls in past yr: 1  Injury with Fall? Yes     Depression Screen PHQ 2/9 Scores 03/19/2015  PHQ - 2 Score 0    Cognitive Testing - 6-CIT  Correct? Score   What year is it? yes 0 0 or 4  What month is it? yes 0 0 or 3  Memorize:    Pia Mau,  [address],  [address]  [place],      What time  is it? (within 1 hour) yes 0 0 or 3  Count backwards from 20 yes 0 0, 2, or 4  Name the months of the year yes 0 0, 2, or 4  Repeat name & address above yes 0 0, 2, 4, 6, 8, or 10       TOTAL SCORE  0/28   Interpretation:  Normal  Normal (0-7) Abnormal (8-28)       Assessment & Plan:     Annual Wellness Visit  Reviewed patient's Family Medical History Reviewed and updated list of patient's medical providers Assessment of cognitive impairment was done Assessed patient's functional ability Established a written schedule for health screening [place] Completed and Reviewed  Exercise Activities and Dietary recommendations Goals    . Exercise 150 minutes per week (moderate activity)       Immunization History  Administered Date(s) Administered  . Pneumococcal Conjugate-13 12/10/2013  . Pneumococcal Polysaccharide-23 06/02/2010  . Td 06/29/2003  . Zoster 04/18/2006    Health Maintenance  Topic Date Due  . Hepatitis C Screening  06[birth year]02/45  . TETANUS/TDAP  08/04/1963  . MAMMOGRAM  08/03/1994  . ZOSTAVAX  08/03/2004  . DEXA SCAN  08/03/2009  . PNA vac Low Risk Adult (1 of 2 - PCV13) 08/03/2009  . INFLUENZA VACCINE  01/11/2015  . COLONOSCOPY  04/29/2022         1. Medicare annual wellness visit, subsequent Stable. Patient advised to continue eating healthy and exercise daily.  2. Osteoporosis screening - DG Bone Density; Future  3. Blood glucose elevated -  Comprehensive metabolic panel - Hemoglobin A1c  4. Hypercholesteremia - Lipid Panel With LDL/HDL Ratio - TSH  5. Paresthesia of right foot - Vitamin B12  6. Humerus fracture, right, with delayed healing, subsequent encounter S/P fall , May 23,2016. Patient advised to F/U with ortho in [place].     Patient seen and examined by Dr. Jerrell Belfast, and note scribed by Philbert Riser. Dimas, CMA.  I have reviewed the document for accuracy and completeness and I agree with above. Jerrell Belfast, MD   Margarita Rana, MD      ------------------------------------------------------------------------------------------------------------

## 2015-03-27 LAB — HEMOGLOBIN A1C
Est. average glucose Bld gHb Est-mCnc: 120 mg/dL
Hgb A1c MFr Bld: 5.8 % — ABNORMAL HIGH (ref 4.8–5.6)

## 2015-03-27 LAB — COMPREHENSIVE METABOLIC PANEL
A/G RATIO: 1.8 (ref 1.1–2.5)
ALK PHOS: 68 IU/L (ref 39–117)
ALT: 18 IU/L (ref 0–32)
AST: 18 IU/L (ref 0–40)
Albumin: 4.4 g/dL (ref 3.5–4.8)
BUN/Creatinine Ratio: 21 (ref 11–26)
BUN: 15 mg/dL (ref 8–27)
Bilirubin Total: 0.5 mg/dL (ref 0.0–1.2)
CO2: 26 mmol/L (ref 18–29)
Calcium: 10 mg/dL (ref 8.7–10.3)
Chloride: 101 mmol/L (ref 97–108)
Creatinine, Ser: 0.72 mg/dL (ref 0.57–1.00)
GFR calc Af Amer: 98 mL/min/{1.73_m2} (ref >59)
GFR calc non Af Amer: 85 mL/min/{1.73_m2} (ref >59)
GLOBULIN, TOTAL: 2.4 g/dL (ref 1.5–4.5)
Glucose: 94 mg/dL (ref 65–99)
POTASSIUM: 4.8 mmol/L (ref 3.5–5.2)
SODIUM: 142 mmol/L (ref 134–144)
Total Protein: 6.8 g/dL (ref 6.0–8.5)

## 2015-03-27 LAB — TSH: TSH: 2.89 u[IU]/mL (ref 0.450–4.500)

## 2015-03-27 LAB — LIPID PANEL WITH LDL/HDL RATIO
CHOLESTEROL TOTAL: 256 mg/dL — AB (ref 100–199)
HDL: 59 mg/dL (ref >39)
LDL CALC: 166 mg/dL — AB (ref 0–99)
LDl/HDL Ratio: 2.8 ratio units (ref 0.0–3.2)
TRIGLYCERIDES: 154 mg/dL — AB (ref 0–149)
VLDL CHOLESTEROL CAL: 31 mg/dL (ref 5–40)

## 2015-03-27 LAB — VITAMIN B12: Vitamin B-12: 367 pg/mL (ref 211–946)

## 2015-03-29 ENCOUNTER — Telehealth: Payer: Self-pay

## 2015-03-29 NOTE — Telephone Encounter (Signed)
Pt advised; she says she did not tolerate statins in the past.  She would like to continue with lifestyle changes.   Thanks,   -Mickel Baas

## 2015-03-29 NOTE — Telephone Encounter (Signed)
-----   Message from Margarita Rana, MD sent at 03/27/2015 11:48 AM EDT ----- Blood sugar stable. B12 ok. Cholesterol is elevated at 256, but does have high good cholesterol. Without other risk factors, 10 year risk of heart disease is 7 percent and medication is controversial. Please see if patent would like to start a statin or continue lifestyle and recheck annually. Thanks.

## 2015-03-31 ENCOUNTER — Ambulatory Visit: Payer: Medicare PPO

## 2015-04-01 ENCOUNTER — Ambulatory Visit
Admission: RE | Admit: 2015-04-01 | Discharge: 2015-04-01 | Disposition: A | Discharge status: Home / Self Care | Payer: Medicare PPO | Source: Ambulatory Visit | Attending: Family Medicine | Admitting: Family Medicine

## 2015-04-01 DIAGNOSIS — Z13820 Encounter for screening for osteoporosis: Secondary | ICD-10-CM | POA: Insufficient documentation

## 2015-04-01 DIAGNOSIS — Z780 Asymptomatic menopausal state: Secondary | ICD-10-CM | POA: Insufficient documentation

## 2015-04-02 ENCOUNTER — Telehealth: Payer: Self-pay

## 2015-04-02 NOTE — Telephone Encounter (Signed)
Pt advised-aa 

## 2015-04-02 NOTE — Telephone Encounter (Signed)
LMTCB 04/02/2015  Thanks,   -Laura  

## 2015-04-02 NOTE — Telephone Encounter (Signed)
-----   Message from Margarita Rana, MD sent at 04/01/2015  4:57 PM EDT ----- Does have osteoporosis on Bone Density.  Please have patient confirm with her orthopedic doctor that he wants her starting treatment at this time. Thanks.  If he does, then have her come in to discuss. Thanks.

## 2015-04-06 NOTE — Telephone Encounter (Signed)
error 

## 2015-04-14 ENCOUNTER — Other Ambulatory Visit: Payer: Self-pay | Admitting: Family Medicine

## 2015-04-14 DIAGNOSIS — F419 Anxiety disorder, unspecified: Secondary | ICD-10-CM

## 2015-04-14 MED ORDER — ALPRAZOLAM 0.5 MG PO TABS: 0.2500 mg | ORAL_TABLET | Freq: Every day | ORAL | Status: DC | PRN

## 2015-04-14 NOTE — Telephone Encounter (Signed)
Pt contacted office for refill request on the following medications: ALPRAZolam (XANAX) 0.5 MG tablet to [facility]. Thanks TNP

## 2015-04-14 NOTE — Telephone Encounter (Signed)
Prescription called to [facility]. sd

## 2015-04-14 NOTE — Telephone Encounter (Signed)
Printed, please fax or call in to pharmacy. Thank you.   

## 2015-04-14 NOTE — Telephone Encounter (Signed)
Last filled on 12/27/14 with 4 refills. Last OV was 03/19/15.

## 2015-05-07 ENCOUNTER — Telehealth: Payer: Self-pay

## 2015-05-07 NOTE — Telephone Encounter (Signed)
Pt called concerned because she accidentally swallowed an earring about 15-20 minutes ago. She is experiencing no pain, but is concerned due to swallowing the back of the earring. Please advise. CB# [phone]. Renaldo Fiddler, CMA

## 2015-05-07 NOTE — Telephone Encounter (Signed)
Talked with patient. Will monitor symptoms and check stools. No current symptoms. Thanks.

## 2015-06-16 DIAGNOSIS — S42341K Displaced spiral fracture of shaft of humerus, right arm, subsequent encounter for fracture with nonunion: Secondary | ICD-10-CM | POA: Diagnosis not present

## 2015-06-29 ENCOUNTER — Telehealth: Payer: Self-pay

## 2015-06-29 MED ORDER — OSELTAMIVIR PHOSPHATE 75 MG PO CAPS: 75.0000 mg | ORAL_CAPSULE | Freq: Two times a day (BID) | ORAL | Status: DC

## 2015-06-29 NOTE — Telephone Encounter (Signed)
Sent rx. Please notify patient. Thanks.   

## 2015-06-29 NOTE — Telephone Encounter (Signed)
Please clarify what flu like symptoms means?  Thanks.

## 2015-06-29 NOTE — Telephone Encounter (Signed)
Advised patient as below.  

## 2015-06-29 NOTE — Telephone Encounter (Signed)
Patient states she is having flu like symptoms. Patient is scheduled to have surgery next week for a fractured arm. Patient states she was told she could not take any medication prior to surgery. Patient is asking for recommendations. Offered patient a appointment to be seen and she said she doesn't think she can drive due to broken arm and flu like symptoms. CB#[phone].

## 2015-06-29 NOTE — Telephone Encounter (Signed)
Patient reports that she is having body aches, chills, fever up to 102 last night. She reports that she was around her granddaughter this past weekend that ended up testing positive for flu and she has the same symptoms. She reports that yesterday morning she was fine, but symptoms came all of a sudden last night. She reports that her fever is down to 99.8, but she is still feeling bad. She denies cough, but does have a little nasal congestion. She was told that she can not take any NSAIDS due to an upcoming shoulder surgery next week. Patient is requesting [place]. She uses [facility]. Thanks!

## 2015-06-30 ENCOUNTER — Inpatient Hospital Stay (HOSPITAL_COMMUNITY): Admission: RE | Admit: 2015-06-30 | Payer: Self-pay | Source: Ambulatory Visit

## 2015-07-01 NOTE — Pre-Procedure Instructions (Signed)
Sandar Rodenbeck  07/01/2015     Your procedure is scheduled on : Thursday July 08, 2015 at 1:15 PM.  Report to [facility] Admitting at 11:10 AM.  Call this number if you have problems the morning of surgery: [phone]    Remember:  Do not eat food or drink liquids after midnight.  Take these medicines the morning of surgery with A SIP OF WATER : Cetirizine (Zyrtec), Diltiazem (Cardizem) if needed, Flonase nasal spray   Stop taking any vitamins, herbal mediations, aspirin, NSAIDs, Ibuprofen, Advil, Motrin, Aleve, etc    Do not wear jewelry, make-up or nail polish.  Do not wear lotions, powders, or perfumes.   Do not shave 48 hours prior to surgery.    Do not bring valuables to the hospital.  [facility] is not responsible for any belongings or valuables.  Contacts, dentures or bridgework may not be worn into surgery.  Leave your suitcase in the car.  After surgery it may be brought to your room.  For patients admitted to the hospital, discharge time will be determined by your treatment team.  Patients discharged the day of surgery will not be allowed to drive home.   Name and phone number of your driver:    Special instructions:  Shower using CHG soap the night before and the morning of your surgery  Please read over the following fact sheets that you were given. Pain Booklet, Coughing and Deep Breathing and Surgical Site Infection Prevention

## 2015-07-02 ENCOUNTER — Encounter (HOSPITAL_COMMUNITY): Payer: Self-pay

## 2015-07-02 ENCOUNTER — Encounter (HOSPITAL_COMMUNITY)
Admission: RE | Admit: 2015-07-02 | Discharge: 2015-07-02 | Disposition: A | Discharge status: Home / Self Care | Payer: PPO | Source: Ambulatory Visit | Attending: Orthopedic Surgery | Admitting: Orthopedic Surgery

## 2015-07-02 DIAGNOSIS — X58XXXD Exposure to other specified factors, subsequent encounter: Secondary | ICD-10-CM | POA: Insufficient documentation

## 2015-07-02 DIAGNOSIS — K219 Gastro-esophageal reflux disease without esophagitis: Secondary | ICD-10-CM | POA: Insufficient documentation

## 2015-07-02 DIAGNOSIS — Z7982 Long term (current) use of aspirin: Secondary | ICD-10-CM | POA: Diagnosis not present

## 2015-07-02 DIAGNOSIS — Z01818 Encounter for other preprocedural examination: Secondary | ICD-10-CM | POA: Diagnosis not present

## 2015-07-02 DIAGNOSIS — E785 Hyperlipidemia, unspecified: Secondary | ICD-10-CM | POA: Diagnosis not present

## 2015-07-02 DIAGNOSIS — Z79899 Other long term (current) drug therapy: Secondary | ICD-10-CM | POA: Insufficient documentation

## 2015-07-02 DIAGNOSIS — Z01812 Encounter for preprocedural laboratory examination: Secondary | ICD-10-CM | POA: Insufficient documentation

## 2015-07-02 DIAGNOSIS — S42301K Unspecified fracture of shaft of humerus, right arm, subsequent encounter for fracture with nonunion: Secondary | ICD-10-CM | POA: Insufficient documentation

## 2015-07-02 DIAGNOSIS — R001 Bradycardia, unspecified: Secondary | ICD-10-CM | POA: Insufficient documentation

## 2015-07-02 DIAGNOSIS — Z87891 Personal history of nicotine dependence: Secondary | ICD-10-CM | POA: Insufficient documentation

## 2015-07-02 HISTORY — DX: Cardiac arrhythmia, unspecified: I49.9

## 2015-07-02 LAB — CBC WITH DIFFERENTIAL/PLATELET
BASOS ABS: 0 10*3/uL (ref 0.0–0.1)
BASOS PCT: 0 %
EOS ABS: 0.2 10*3/uL (ref 0.0–0.7)
EOS PCT: 3 %
HCT: 42 % (ref 36.0–46.0)
Hemoglobin: 13.8 g/dL (ref 12.0–15.0)
Lymphocytes Relative: 32 %
Lymphs Abs: 1.9 10*3/uL (ref 0.7–4.0)
MCH: 31.1 pg (ref 26.0–34.0)
MCHC: 32.9 g/dL (ref 30.0–36.0)
MCV: 94.6 fL (ref 78.0–100.0)
MONO ABS: 0.4 10*3/uL (ref 0.1–1.0)
Monocytes Relative: 8 %
Neutro Abs: 3.3 10*3/uL (ref 1.7–7.7)
Neutrophils Relative %: 57 %
PLATELETS: 282 10*3/uL (ref 150–400)
RBC: 4.44 MIL/uL (ref 3.87–5.11)
RDW: 12.5 % (ref 11.5–15.5)
WBC: 5.8 10*3/uL (ref 4.0–10.5)

## 2015-07-02 LAB — APTT: APTT: 32 s (ref 24–37)

## 2015-07-02 LAB — COMPREHENSIVE METABOLIC PANEL
ALBUMIN: 4.2 g/dL (ref 3.5–5.0)
ALT: 76 U/L — AB (ref 14–54)
AST: 34 U/L (ref 15–41)
Alkaline Phosphatase: 71 U/L (ref 38–126)
Anion gap: 12 (ref 5–15)
BUN: 12 mg/dL (ref 6–20)
CHLORIDE: 104 mmol/L (ref 101–111)
CO2: 25 mmol/L (ref 22–32)
Calcium: 10.4 mg/dL — ABNORMAL HIGH (ref 8.9–10.3)
Creatinine, Ser: 0.64 mg/dL (ref 0.44–1.00)
GFR calc Af Amer: >60 mL/min (ref >60)
Glucose, Bld: 98 mg/dL (ref 65–99)
POTASSIUM: 3.7 mmol/L (ref 3.5–5.1)
SODIUM: 141 mmol/L (ref 135–145)
Total Bilirubin: 0.7 mg/dL (ref 0.3–1.2)
Total Protein: 7.4 g/dL (ref 6.5–8.1)

## 2015-07-02 LAB — PROTIME-INR
INR: 1.05 (ref 0.00–1.49)
PROTHROMBIN TIME: 13.9 s (ref 11.6–15.2)

## 2015-07-05 NOTE — Progress Notes (Signed)
Anesthesia Chart Review:  Pt is 70 year old female scheduled for ORIF R humeral shaft fracture on 07/08/2015 with Dr. Onnie Graham.   PCP is Dr. Margarita Rana.   PMH includes:  Palpitations, hyperlipidemia, anemia, GERD. Former smoker. BMI 23  Medications include: ASA, diltiazem (TID prn palpitations).   VS were not obtained at PAT.   Preoperative labs reviewed.    EKG 07/02/15: Sinus bradycardia (54 bpm). Possible Left atrial enlargement. Septal infarct, age undetermined  Spoke with pt by telephone. She reports she was sick a week ago with fever, aches, chills, but feels very well at this time. Denies any sick symptoms. Did not take tamiflu as prescribed by Dr. Venia Minks because she felt better.   If no changes, I anticipate pt can proceed with surgery as scheduled.   Willeen Cass, FNP-BC [facility]/Anesthesiology Phone: [phone] 07/05/2015 2:33 PM

## 2015-07-07 MED ORDER — LACTATED RINGERS IV SOLN
INTRAVENOUS | Status: DC
  Administered 2015-07-08 (×2): via INTRAVENOUS

## 2015-07-07 MED ORDER — CEFAZOLIN SODIUM-DEXTROSE 2-3 GM-% IV SOLR
2.0000 g | INTRAVENOUS | Status: AC
  Administered 2015-07-08: 2 g via INTRAVENOUS
  Filled 2015-07-07: qty 50

## 2015-07-07 MED ORDER — CHLORHEXIDINE GLUCONATE 4 % EX LIQD: 60.0000 mL | Freq: Once | CUTANEOUS | Status: DC

## 2015-07-08 ENCOUNTER — Ambulatory Visit (HOSPITAL_COMMUNITY): Payer: PPO | Admitting: Emergency Medicine

## 2015-07-08 ENCOUNTER — Observation Stay (HOSPITAL_COMMUNITY)
Admission: RE | Admit: 2015-07-08 | Discharge: 2015-07-09 | Disposition: A | Discharge status: Home / Self Care | Payer: PPO | Source: Ambulatory Visit | Attending: Orthopedic Surgery | Admitting: Orthopedic Surgery

## 2015-07-08 ENCOUNTER — Encounter (HOSPITAL_COMMUNITY)
Admission: RE | Disposition: A | Discharge status: Home / Self Care | Payer: Self-pay | Source: Ambulatory Visit | Attending: Orthopedic Surgery

## 2015-07-08 ENCOUNTER — Ambulatory Visit (HOSPITAL_COMMUNITY): Payer: PPO | Admitting: Anesthesiology

## 2015-07-08 ENCOUNTER — Ambulatory Visit (HOSPITAL_COMMUNITY): Payer: PPO

## 2015-07-08 ENCOUNTER — Encounter (HOSPITAL_COMMUNITY): Payer: Self-pay | Admitting: Anesthesiology

## 2015-07-08 DIAGNOSIS — S42344K Nondisplaced spiral fracture of shaft of humerus, right arm, subsequent encounter for fracture with nonunion: Secondary | ICD-10-CM | POA: Diagnosis not present

## 2015-07-08 DIAGNOSIS — Z87891 Personal history of nicotine dependence: Secondary | ICD-10-CM | POA: Diagnosis not present

## 2015-07-08 DIAGNOSIS — K219 Gastro-esophageal reflux disease without esophagitis: Secondary | ICD-10-CM | POA: Insufficient documentation

## 2015-07-08 DIAGNOSIS — S42391A Other fracture of shaft of right humerus, initial encounter for closed fracture: Secondary | ICD-10-CM | POA: Diagnosis not present

## 2015-07-08 DIAGNOSIS — E785 Hyperlipidemia, unspecified: Secondary | ICD-10-CM | POA: Diagnosis not present

## 2015-07-08 DIAGNOSIS — Z85828 Personal history of other malignant neoplasm of skin: Secondary | ICD-10-CM | POA: Diagnosis not present

## 2015-07-08 DIAGNOSIS — S42309A Unspecified fracture of shaft of humerus, unspecified arm, initial encounter for closed fracture: Secondary | ICD-10-CM | POA: Diagnosis present

## 2015-07-08 DIAGNOSIS — S42391K Other fracture of shaft of right humerus, subsequent encounter for fracture with nonunion: Principal | ICD-10-CM | POA: Insufficient documentation

## 2015-07-08 DIAGNOSIS — F419 Anxiety disorder, unspecified: Secondary | ICD-10-CM | POA: Diagnosis not present

## 2015-07-08 DIAGNOSIS — D649 Anemia, unspecified: Secondary | ICD-10-CM | POA: Insufficient documentation

## 2015-07-08 DIAGNOSIS — Z885 Allergy status to narcotic agent status: Secondary | ICD-10-CM | POA: Insufficient documentation

## 2015-07-08 DIAGNOSIS — X58XXXD Exposure to other specified factors, subsequent encounter: Secondary | ICD-10-CM | POA: Diagnosis not present

## 2015-07-08 DIAGNOSIS — M1990 Unspecified osteoarthritis, unspecified site: Secondary | ICD-10-CM | POA: Insufficient documentation

## 2015-07-08 DIAGNOSIS — R011 Cardiac murmur, unspecified: Secondary | ICD-10-CM | POA: Insufficient documentation

## 2015-07-08 DIAGNOSIS — G8918 Other acute postprocedural pain: Secondary | ICD-10-CM | POA: Diagnosis not present

## 2015-07-08 DIAGNOSIS — Z7982 Long term (current) use of aspirin: Secondary | ICD-10-CM | POA: Insufficient documentation

## 2015-07-08 DIAGNOSIS — Z419 Encounter for procedure for purposes other than remedying health state, unspecified: Secondary | ICD-10-CM

## 2015-07-08 DIAGNOSIS — S42301K Unspecified fracture of shaft of humerus, right arm, subsequent encounter for fracture with nonunion: Secondary | ICD-10-CM | POA: Diagnosis not present

## 2015-07-08 HISTORY — PX: ORIF HUMERUS FRACTURE: SHX2126

## 2015-07-08 SURGERY — OPEN REDUCTION INTERNAL FIXATION (ORIF) HUMERAL SHAFT FRACTURE
Anesthesia: Regional | Laterality: Right

## 2015-07-08 MED ORDER — PHENYLEPHRINE HCL 10 MG/ML IJ SOLN
INTRAMUSCULAR | Status: DC | PRN
  Administered 2015-07-08 (×2): 80 ug via INTRAVENOUS

## 2015-07-08 MED ORDER — TRAMADOL HCL 50 MG PO TABS
50.0000 mg | ORAL_TABLET | Freq: Four times a day (QID) | ORAL | Status: DC
  Administered 2015-07-08 (×2): 50 mg via ORAL
  Administered 2015-07-09: 100 mg via ORAL
  Filled 2015-07-08: qty 2
  Filled 2015-07-08: qty 1
  Filled 2015-07-08: qty 2

## 2015-07-08 MED ORDER — LIDOCAINE HCL (CARDIAC) 20 MG/ML IV SOLN
INTRAVENOUS | Status: AC
  Filled 2015-07-08: qty 5

## 2015-07-08 MED ORDER — ONDANSETRON HCL 4 MG/2ML IJ SOLN
INTRAMUSCULAR | Status: AC
  Filled 2015-07-08: qty 2

## 2015-07-08 MED ORDER — PROMETHAZINE HCL 25 MG/ML IJ SOLN: 6.2500 mg | INTRAMUSCULAR | Status: DC | PRN

## 2015-07-08 MED ORDER — PROPOFOL 10 MG/ML IV BOLUS
INTRAVENOUS | Status: AC
Start: 2015-07-08 — End: 2015-07-08
  Filled 2015-07-08: qty 20

## 2015-07-08 MED ORDER — BISACODYL 5 MG PO TBEC: 5.0000 mg | DELAYED_RELEASE_TABLET | Freq: Every day | ORAL | Status: DC | PRN

## 2015-07-08 MED ORDER — PROPOFOL 10 MG/ML IV BOLUS
INTRAVENOUS | Status: DC | PRN
  Administered 2015-07-08 (×2): 50 mg via INTRAVENOUS
  Administered 2015-07-08: 150 mg via INTRAVENOUS

## 2015-07-08 MED ORDER — FENTANYL CITRATE (PF) 100 MCG/2ML IJ SOLN
INTRAMUSCULAR | Status: DC | PRN
  Administered 2015-07-08: 50 ug via INTRAVENOUS

## 2015-07-08 MED ORDER — MIDAZOLAM HCL 2 MG/2ML IJ SOLN
INTRAMUSCULAR | Status: AC
  Administered 2015-07-08: 2 mg
  Filled 2015-07-08: qty 2

## 2015-07-08 MED ORDER — LACTATED RINGERS IV SOLN
INTRAVENOUS | Status: DC
  Administered 2015-07-08: 18:00:00 via INTRAVENOUS
  Administered 2015-07-09: 75 mL/h via INTRAVENOUS

## 2015-07-08 MED ORDER — ONDANSETRON HCL 4 MG/2ML IJ SOLN: 4.0000 mg | Freq: Four times a day (QID) | INTRAMUSCULAR | Status: DC | PRN

## 2015-07-08 MED ORDER — MAGNESIUM CITRATE PO SOLN: 1.0000 | Freq: Once | ORAL | Status: DC | PRN

## 2015-07-08 MED ORDER — LACTATED RINGERS IV SOLN
INTRAVENOUS | Status: DC
  Administered 2015-07-08: 12:00:00 via INTRAVENOUS

## 2015-07-08 MED ORDER — ONDANSETRON HCL 4 MG PO TABS: 4.0000 mg | ORAL_TABLET | Freq: Four times a day (QID) | ORAL | Status: DC | PRN

## 2015-07-08 MED ORDER — MENTHOL 3 MG MT LOZG: 1.0000 | LOZENGE | OROMUCOSAL | Status: DC | PRN

## 2015-07-08 MED ORDER — BUPIVACAINE-EPINEPHRINE (PF) 0.5% -1:200000 IJ SOLN
INTRAMUSCULAR | Status: DC | PRN
  Administered 2015-07-08: 30 mL via PERINEURAL

## 2015-07-08 MED ORDER — LIDOCAINE HCL (CARDIAC) 20 MG/ML IV SOLN
INTRAVENOUS | Status: DC | PRN
  Administered 2015-07-08: 100 mg via INTRAVENOUS

## 2015-07-08 MED ORDER — DIPHENHYDRAMINE HCL 12.5 MG/5ML PO ELIX
12.5000 mg | ORAL_SOLUTION | ORAL | Status: DC | PRN
  Filled 2015-07-08: qty 10

## 2015-07-08 MED ORDER — SODIUM CHLORIDE 0.9 % IV SOLN
10.0000 mg | INTRAVENOUS | Status: DC | PRN
  Administered 2015-07-08: 25 ug/min via INTRAVENOUS

## 2015-07-08 MED ORDER — ASPIRIN 81 MG PO TBEC: 81.0000 mg | DELAYED_RELEASE_TABLET | Freq: Every day | ORAL | Status: DC

## 2015-07-08 MED ORDER — METOCLOPRAMIDE HCL 5 MG PO TABS: 5.0000 mg | ORAL_TABLET | Freq: Three times a day (TID) | ORAL | Status: DC | PRN

## 2015-07-08 MED ORDER — POLYETHYLENE GLYCOL 3350 17 G PO PACK: 17.0000 g | PACK | Freq: Every day | ORAL | Status: DC | PRN

## 2015-07-08 MED ORDER — HYDROMORPHONE HCL 2 MG PO TABS
2.0000 mg | ORAL_TABLET | ORAL | Status: DC | PRN
  Administered 2015-07-08: 2 mg via ORAL
  Administered 2015-07-09 (×3): 4 mg via ORAL
  Filled 2015-07-08: qty 2
  Filled 2015-07-08: qty 1
  Filled 2015-07-08 (×2): qty 2

## 2015-07-08 MED ORDER — PROPOFOL 10 MG/ML IV BOLUS
INTRAVENOUS | Status: AC
  Filled 2015-07-08: qty 20

## 2015-07-08 MED ORDER — PHENYLEPHRINE HCL 10 MG/ML IJ SOLN
INTRAMUSCULAR | Status: AC
Start: 2015-07-08 — End: 2015-07-08
  Filled 2015-07-08: qty 1

## 2015-07-08 MED ORDER — ASPIRIN EC 81 MG PO TBEC
81.0000 mg | DELAYED_RELEASE_TABLET | Freq: Every day | ORAL | Status: DC
  Administered 2015-07-09: 81 mg via ORAL
  Filled 2015-07-08: qty 1

## 2015-07-08 MED ORDER — FENTANYL CITRATE (PF) 100 MCG/2ML IJ SOLN
INTRAMUSCULAR | Status: AC
  Administered 2015-07-08: 50 ug
  Filled 2015-07-08: qty 2

## 2015-07-08 MED ORDER — ONDANSETRON HCL 4 MG/2ML IJ SOLN
INTRAMUSCULAR | Status: DC | PRN
  Administered 2015-07-08: 4 mg via INTRAVENOUS

## 2015-07-08 MED ORDER — BUPIVACAINE HCL (PF) 0.25 % IJ SOLN
INTRAMUSCULAR | Status: AC
Start: 2015-07-08 — End: 2015-07-08
  Filled 2015-07-08: qty 30

## 2015-07-08 MED ORDER — CEFAZOLIN SODIUM-DEXTROSE 2-3 GM-% IV SOLR
2.0000 g | Freq: Four times a day (QID) | INTRAVENOUS | Status: AC
  Administered 2015-07-08 – 2015-07-09 (×3): 2 g via INTRAVENOUS
  Filled 2015-07-08 (×3): qty 50

## 2015-07-08 MED ORDER — HYDROMORPHONE HCL 1 MG/ML IJ SOLN
0.2500 mg | INTRAMUSCULAR | Status: DC | PRN
Start: 2015-07-08 — End: 2015-07-08

## 2015-07-08 MED ORDER — DILTIAZEM HCL 30 MG PO TABS
30.0000 mg | ORAL_TABLET | Freq: Three times a day (TID) | ORAL | Status: DC | PRN
  Filled 2015-07-08: qty 1

## 2015-07-08 MED ORDER — HYDROMORPHONE HCL 1 MG/ML IJ SOLN
1.0000 mg | INTRAMUSCULAR | Status: DC | PRN
  Administered 2015-07-08: 1 mg via INTRAVENOUS
  Filled 2015-07-08: qty 1

## 2015-07-08 MED ORDER — DOCUSATE SODIUM 100 MG PO CAPS
100.0000 mg | ORAL_CAPSULE | Freq: Two times a day (BID) | ORAL | Status: DC
  Administered 2015-07-08 – 2015-07-09 (×2): 100 mg via ORAL
  Filled 2015-07-08 (×2): qty 1

## 2015-07-08 MED ORDER — ROCURONIUM BROMIDE 50 MG/5ML IV SOLN
INTRAVENOUS | Status: AC
  Filled 2015-07-08: qty 1

## 2015-07-08 MED ORDER — TRAMADOL HCL 50 MG PO TABS
ORAL_TABLET | ORAL | Status: AC
  Administered 2015-07-08: 50 mg via ORAL
  Filled 2015-07-08: qty 1

## 2015-07-08 MED ORDER — MEPERIDINE HCL 25 MG/ML IJ SOLN: 6.2500 mg | INTRAMUSCULAR | Status: DC | PRN

## 2015-07-08 MED ORDER — FENTANYL CITRATE (PF) 250 MCG/5ML IJ SOLN
INTRAMUSCULAR | Status: AC
  Filled 2015-07-08: qty 5

## 2015-07-08 MED ORDER — 0.9 % SODIUM CHLORIDE (POUR BTL) OPTIME
TOPICAL | Status: DC | PRN
  Administered 2015-07-08: 1000 mL

## 2015-07-08 MED ORDER — PHENOL 1.4 % MT LIQD: 1.0000 | OROMUCOSAL | Status: DC | PRN

## 2015-07-08 MED ORDER — FLUTICASONE PROPIONATE 50 MCG/ACT NA SUSP
2.0000 | NASAL | Status: DC | PRN
  Filled 2015-07-08: qty 16

## 2015-07-08 MED ORDER — METOCLOPRAMIDE HCL 5 MG/ML IJ SOLN: 5.0000 mg | Freq: Three times a day (TID) | INTRAMUSCULAR | Status: DC | PRN

## 2015-07-08 SURGICAL SUPPLY — 65 items
BANDAGE ELASTIC 4 VELCRO ST LF (GAUZE/BANDAGES/DRESSINGS) IMPLANT
BIT DRILL 110X2.5XQCK CNCT (BIT) ×1 IMPLANT
BIT DRILL 2.5 (BIT) ×1
BIT DRILL 2.7 (BIT) ×1
BIT DRILL 2.7MM (BIT) ×1 IMPLANT
BIT DRILL QC 110 3.5 (BIT) ×1
BIT DRILL QC 110 3.5MM (BIT) ×1 IMPLANT
BIT DRL 110X2.5XQCK CNCT (BIT) ×1
BNDG COHESIVE 4X5 TAN STRL (GAUZE/BANDAGES/DRESSINGS) ×2 IMPLANT
BNDG GAUZE ELAST 4 BULKY (GAUZE/BANDAGES/DRESSINGS) IMPLANT
COVER MAYO STAND STRL (DRAPES) IMPLANT
COVER SURGICAL LIGHT HANDLE (MISCELLANEOUS) ×2 IMPLANT
DRAPE C-ARM 42X72 X-RAY (DRAPES) IMPLANT
DRAPE IMP U-DRAPE 54X76 (DRAPES) ×2 IMPLANT
DRAPE INCISE IOBAN 66X45 STRL (DRAPES) ×2 IMPLANT
DRAPE SURG 17X23 STRL (DRAPES) ×2 IMPLANT
DRAPE U-SHAPE 47X51 STRL (DRAPES) ×2 IMPLANT
DRILL BIT 2.7MM (BIT) ×1
DRILL BIT QC 110 3.5MM (BIT) ×1
DRSG AQUACEL AG ADV 3.5X10 (GAUZE/BANDAGES/DRESSINGS) ×2 IMPLANT
DURAPREP 26ML APPLICATOR (WOUND CARE) ×2 IMPLANT
ELECT CAUTERY BLADE 6.4 (BLADE) ×2 IMPLANT
ELECT REM PT RETURN 9FT ADLT (ELECTROSURGICAL) ×2
ELECTRODE REM PT RTRN 9FT ADLT (ELECTROSURGICAL) ×1 IMPLANT
GAUZE SPONGE 4X4 12PLY STRL (GAUZE/BANDAGES/DRESSINGS) IMPLANT
GAUZE XEROFORM 1X8 LF (GAUZE/BANDAGES/DRESSINGS) IMPLANT
GLOVE BIO SURGEON STRL SZ7.5 (GLOVE) ×2 IMPLANT
GLOVE BIO SURGEON STRL SZ8 (GLOVE) ×2 IMPLANT
GLOVE BIOGEL PI IND STRL 6 (GLOVE) ×1 IMPLANT
GLOVE BIOGEL PI INDICATOR 6 (GLOVE) ×1
GLOVE EUDERMIC 7 POWDERFREE (GLOVE) ×4 IMPLANT
GLOVE SS BIOGEL STRL SZ 7.5 (GLOVE) ×1 IMPLANT
GLOVE SUPERSENSE BIOGEL SZ 7.5 (GLOVE) ×1
GLOVE SURG SS PI 6.0 STRL IVOR (GLOVE) ×2 IMPLANT
GOWN STRL REUS W/ TWL LRG LVL3 (GOWN DISPOSABLE) ×1 IMPLANT
GOWN STRL REUS W/ TWL XL LVL3 (GOWN DISPOSABLE) ×2 IMPLANT
GOWN STRL REUS W/TWL LRG LVL3 (GOWN DISPOSABLE) ×1
GOWN STRL REUS W/TWL XL LVL3 (GOWN DISPOSABLE) ×2
KIT BASIN OR (CUSTOM PROCEDURE TRAY) ×2 IMPLANT
KIT ROOM TURNOVER OR (KITS) ×2 IMPLANT
MANIFOLD NEPTUNE II (INSTRUMENTS) ×2 IMPLANT
MIX DBX 10CC 35% BONE (Bone Implant) ×2 IMPLANT
NEEDLE HYPO 25GX1X1/2 BEV (NEEDLE) IMPLANT
NS IRRIG 1000ML POUR BTL (IV SOLUTION) ×2 IMPLANT
PACK SHOULDER (CUSTOM PROCEDURE TRAY) ×2 IMPLANT
PACK UNIVERSAL I (CUSTOM PROCEDURE TRAY) ×2 IMPLANT
PAD ARMBOARD 7.5X6 YLW CONV (MISCELLANEOUS) ×4 IMPLANT
PAD CAST 4YDX4 CTTN HI CHSV (CAST SUPPLIES) IMPLANT
PADDING CAST COTTON 4X4 STRL (CAST SUPPLIES)
PLATE COMPRESSION 12HOLE (Plate) ×2 IMPLANT
SCREW CORT 2.5X24X3.5XST SM (Screw) ×4 IMPLANT
SCREW CORT S/T 3.5X22 (Screw) ×10 IMPLANT
SCREW CORTICAL 3.5X24MM (Screw) ×4 IMPLANT
SCREW LOCKING 3.5 24MM (Screw) ×2 IMPLANT
SPONGE LAP 18X18 X RAY DECT (DISPOSABLE) ×2 IMPLANT
SUCTION FRAZIER HANDLE 10FR (MISCELLANEOUS)
SUCTION TUBE FRAZIER 10FR DISP (MISCELLANEOUS) IMPLANT
SUT MNCRL AB 3-0 PS2 18 (SUTURE) ×2 IMPLANT
SUT VIC AB 2-0 CT1 27 (SUTURE)
SUT VIC AB 2-0 CT1 TAPERPNT 27 (SUTURE) IMPLANT
SYR CONTROL 10ML LL (SYRINGE) IMPLANT
TOWEL OR 17X24 6PK STRL BLUE (TOWEL DISPOSABLE) ×2 IMPLANT
TOWEL OR 17X26 10 PK STRL BLUE (TOWEL DISPOSABLE) ×4 IMPLANT
TUBE CONNECTING 12X1/4 (SUCTIONS) IMPLANT
WATER STERILE IRR 1000ML POUR (IV SOLUTION) ×2 IMPLANT

## 2015-07-08 NOTE — H&P (Signed)
Isabel Gonzalez    Chief Complaint: right humeral shaft fx non-union HPI: The patient is a 70 y.o. female with painful right humeral shaft non-union  Past Medical History  Diagnosis Date  . Murmur   . Hyperlipidemia   . Environmental allergies   . Anxiety   . Arthritis   . GERD (gastroesophageal reflux disease)   . Anemia   . Constipation   . Urinary problem   . Cancer (HCC)     squamous cell  . History of bladder infections   . Dysrhythmia     palpitations     Past Surgical History  Procedure Laterality Date  . Facial cosmetic surgery    . Breast reduction surgery    . Skin surgery      squamous cell    Family History  Problem Relation Age of Onset  . Stroke Mother 48  . Diabetes Brother     Social History:  reports that she quit smoking about 2 years ago. She has never used smokeless tobacco. She reports that she drinks about 1.2 oz of alcohol per week. She reports that she does not use illicit drugs.  Allergies:  Allergies  Allergen Reactions  . Percocet [Oxycodone-Acetaminophen] Nausea And Vomiting  . Vicodin [Hydrocodone-Acetaminophen] Nausea And Vomiting    Medications Prior to Admission  Medication Sig Dispense Refill  . ALPRAZolam (XANAX) 0.5 MG tablet Take 0.5 tablets (0.25 mg total) by mouth daily as needed. (Patient taking differently: Take 0.25 mg by mouth daily as needed for sleep. ) 30 tablet 5  . aspirin 81 MG EC tablet Take 81 mg by mouth daily.     . Calcium-Vitamin D (CALTRATE 600 PLUS-VIT D PO) Take 1 tablet by mouth daily.     . cetirizine (ZYRTEC) 10 MG tablet Take 10 mg by mouth daily.      . fluticasone (FLONASE) 50 MCG/ACT nasal spray Place 2 sprays into the nose as needed for allergies.     Marland Kitchen ibuprofen (ADVIL,MOTRIN) 200 MG tablet Take 200 mg by mouth every 8 (eight) hours as needed for moderate pain.     . Lactobacillus-Inulin (CULTURELLE DIGESTIVE HEALTH PO) Take 1 tablet by mouth daily.    . Vitamin D, Cholecalciferol, 1000  UNITS TABS Take 1 tablet by mouth 2 (two) times daily.    Marland Kitchen diltiazem (CARDIZEM) 30 MG tablet Take 1 tablet (30 mg total) by mouth 3 (three) times daily as needed. (Patient taking differently: Take 30 mg by mouth 3 (three) times daily as needed (for heart palpitations). ) 60 tablet 3  . oseltamivir (TAMIFLU) 75 MG capsule Take 1 capsule (75 mg total) by mouth 2 (two) times daily. 10 capsule 0     Physical Exam: right upper arm with gross instability of humeral shaft fracture, grossly n/v intact  Vitals  Temp:  [97.8 F (36.6 C)] 97.8 F (36.6 C) (01/26 1140) Pulse Rate:  [51] 51 (01/26 1140) Resp:  [20] 20 (01/26 1140) BP: (148)/(65) 148/65 mmHg (01/26 1140) SpO2:  [100 %] 100 % (01/26 1140) Weight:  [56.7 kg (125 lb)] 56.7 kg (125 lb) (01/26 1140)  Assessment/Plan  Impression: right humeral shaft fx non-union  Plan of Action: Procedure(s): OPEN REDUCTION INTERNAL FIXATION (ORIF) HUMERAL SHAFT FRACTURE with bone graft and allograft  Liyah Higham M Elyon Zoll 07/08/2015, 12:23 PM Contact # [phone]

## 2015-07-08 NOTE — Transfer of Care (Signed)
Immediate Anesthesia Transfer of Care Note  Patient: Isabel Gonzalez  Procedure(s) Performed: Procedure(s): OPEN REDUCTION INTERNAL FIXATION (ORIF) HUMERAL SHAFT FRACTURE with bone graft and allograft (Right)  Patient Location: PACU  Anesthesia Type:General  Level of Consciousness: awake, alert  and oriented  Airway & Oxygen Therapy: Patient Spontanous Breathing and Patient connected to nasal cannula oxygen  Post-op Assessment: Report given to RN and Post -op Vital signs reviewed and stable  Post vital signs: Reviewed and stable  Last Vitals:  Filed Vitals:   07/08/15 1239 07/08/15 1244  BP: 120/39 129/52  Pulse: 72 75  Temp:    Resp: 17 11    Complications: No apparent anesthesia complications

## 2015-07-08 NOTE — Anesthesia Procedure Notes (Addendum)
Anesthesia Regional Block:  Supraclavicular block  Pre-Anesthetic Checklist: ,, timeout performed, Correct Patient, Correct Site, Correct Laterality, Correct Procedure, Correct Position, site marked, Risks and benefits discussed, Surgical consent,  Pre-op evaluation,  Post-op pain management  Laterality: Right  Prep: chloraprep       Needles:  Injection technique: Single-shot  Needle Type: Stimiplex          Additional Needles:  Procedures: ultrasound guided (picture in chart) Supraclavicular block Narrative:  Injection made incrementally with aspirations every 5 mL.  Performed by: Personally  Anesthesiologist: Nolon Nations  Additional Notes: Risks, benefits and alternative to block explained extensively. Good perineural spread. Patient tolerated procedure well, without complications.   Procedure Name: LMA Insertion Date/Time: 07/08/2015 1:13 PM Performed by: Rush Farmer E Pre-anesthesia Checklist: Patient identified, Emergency Drugs available, Suction available, Patient being monitored and Timeout performed Patient Re-evaluated:Patient Re-evaluated prior to inductionOxygen Delivery Method: Circle system utilized Preoxygenation: Pre-oxygenation with 100% oxygen Intubation Type: IV induction Ventilation: Mask ventilation without difficulty LMA: LMA inserted LMA Size: 4.0 Number of attempts: 1 Placement Confirmation: positive ETCO2 and breath sounds checked- equal and bilateral Tube secured with: Tape Dental Injury: Teeth and Oropharynx as per pre-operative assessment

## 2015-07-08 NOTE — Op Note (Signed)
NAME:  Isabel Gonzalez, Isabel Gonzalez         ACCOUNT NO.:  [id number]  MEDICAL RECORD NO.:  [id number]  LOCATION:  5N26C                        FACILITY:  [facility]  PHYSICIAN:  Metta Clines. Jacier Gladu, M.D.  DATE OF BIRTH:  November 15, 1945  DATE OF PROCEDURE:  07/08/2015 DATE OF DISCHARGE:                              OPERATIVE REPORT   POSTOPERATIVE DIAGNOSIS:  Right humeral shaft fracture, nonunion.  POSTOPERATIVE DIAGNOSIS:  Right humeral shaft fracture, nonunion.  PROCEDURE:  Takedown of nonunion with open reduction and internal fixation of the right humeral shaft fracture as well as application of allograft bone graft material.  SURGEON:  Metta Clines. Manual Navarra, M.D.  Terrence DupontOlivia Mackie A. Shuford, P.A.-C.  ANESTHESIA:  LMA general.  ESTIMATED BLOOD LOSS:  Less than 50 mL.  DRAINS:  None.  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  This is very painful for her with significant ongoing functional limitations.  Due to her ongoing pain and functional limitations, failure to respond to prolonged attempts at conservative management.  She was brought to the operating room at this time for planned open reduction and internal fixation.  Preoperatively, I had counseled Isabel Gonzalez regarding treatment options and potential risks versus benefits thereof.  Possible complications were reviewed, including bleeding, infection, neurovascular injury, persistent pain, loss of motion, failure of fixation, anesthetic complication, and possible need for additional surgery.  She understands and accepts and agrees with our planned procedure.  PROCEDURE IN DETAIL:  After undergoing routine preop evaluation, the patient received prophylactic antibiotics.  An interscalene block was established in the right upper extremity in the holding area by the Anesthesia Department.  Placed supine on the  operating table.  Underwent smooth induction of LMA general anesthesia.  After achievement of adequate anesthesia, the right upper extremity was then sterilely prepped and draped in standard fashion.  Time-out was called.  We made a 15 cm anterior incision along the anterior aspect of the right upper arm centered at the fracture site.  Skin flaps were elevated. Electrocautery was used for hemostasis.  The dissection was carried deeply and proximally identified the deltopectoral interval with the cephalic vein taken laterally with the deltoid.  Dissected down towards the distal insertion of the deltoid and then created our anterior exposure of the biceps, which were carefully retracted medially and this dissection was then carried distally.  We exposed the anterior margin of the humeral shaft, which was closed by the brachialis.  The brachialis was then split in the midline and then we used periosteal subperiosteal dissection to expose the middle and distal thirds of the humeral shaft. This is a very long fracture pattern.  We ultimately gain visualization of the distal extent of the fracture in several centimeters beyond this. We then very carefully, in a subperiosteal dissection technique, exposed the humeral shaft about the fracture site and then carefully divided the interposed soft tissue at the fracture site and then ultimately gained circumferential exposure of the mid humerus.  Care taken to maintain a subperiosteal dissection plane and then mobilized the fracture site and then ultimately excised abundant amount of soft tissue,  which developed along this very long oblique fracture and then ultimately gained mobilization of the fracture, thought it could be brought back out to length with good bony reapposition and removal of all interposed soft tissue.  All the fracture surfaces were curetted to a bleeding surface. We then confirmed appropriate reduction could be achieved and  then selected a 14 hole DC plate from the Zimmer set.  This was then positioned such that we had proper coverage across the fracture site and appropriately span the fracture.  Once we were satisfied with the reduction and position of the plate, we went ahead and performed some initial transfixion proximally and we were able to place 2 lag screws through the plate into the posterior cortex, obtaining excellent compression across the fracture site through the plate and then went ahead and placed an additional series of 35 cortical screws proximally and distally.  All obtaining excellent bony purchase.  The most proximal screw was a locking screw up into the humeral metaphyseal region.  I also placed a lag compression screw from anterior to posterior and over the midportion of the fracture site to allow to have additional compression and again overall construct and repair as much to our satisfaction.  Once we were pleased with the final fluoroscopic images, we performed copious irrigation.  Hemostasis was obtained.  I utilized 10 mL of DBX allograft bone putty and placed this liberally along the fracture line both medially and laterally in the gutters and then carefully closed the brachialis about the humeral shaft and plate and closure of deep soft tissue envelope.  The subcu layer was then closed with 2-0 Vicryl, and intracuticular 3-0 Monocryl used for the skin, followed by Steri-Strips and an Aquacel dressing.  At this point, the patient's right arm placed in a sling.  The patient awakened, extubated, and taken recovery room in stable condition.  Reather Laurence Shuford, [name], was used as an Environmental consultant throughout this case, essential for help with positioning of the patient, positioning of the extremity, management of the retractors, maintenance of the reduction, wound closer, and intraoperative decision making.     Metta Clines. Stclair Szymborski, M.D.     KMS/MEDQ  D:  07/08/2015  T:  07/08/2015   Job:  [id number]

## 2015-07-08 NOTE — Op Note (Signed)
07/08/2015  3:30 PM  PATIENT:   Isabel Gonzalez  70 y.o. female  PRE-OPERATIVE DIAGNOSIS:  right humeral shaft fx non-union  POST-OPERATIVE DIAGNOSIS:  same  PROCEDURE:  ORIF with allograft bone grafting  SURGEON:  Maryl Blalock, Metta Clines. M.D.  ASSISTANTS: Shuford pac   ANESTHESIA:   LMA + ISB  EBL: 50cc  SPECIMEN:  none  Drains: none   PATIENT DISPOSITION:  PACU - hemodynamically stable.    PLAN OF CARE: Admit for overnight observation  Dictation# [id number]   Contact # [phone]

## 2015-07-08 NOTE — Anesthesia Preprocedure Evaluation (Addendum)
Anesthesia Evaluation  Patient identified by MRN, date of birth, ID band Patient awake    Reviewed: Allergy & Precautions, NPO status , Patient's Chart, lab work & pertinent test results  Airway Mallampati: I  TM Distance: >3 FB Neck ROM: Full    Dental no notable dental hx. (+) Teeth Intact   Pulmonary neg pulmonary ROS, former smoker,    Pulmonary exam normal breath sounds clear to auscultation       Cardiovascular negative cardio ROS Normal cardiovascular exam+ dysrhythmias + Valvular Problems/Murmurs  Rhythm:Regular Rate:Normal     Neuro/Psych  Headaches, Anxiety negative neurological ROS     GI/Hepatic negative GI ROS, Neg liver ROS, GERD  ,  Endo/Other  negative endocrine ROS  Renal/GU negative Renal ROS     Musculoskeletal negative musculoskeletal ROS (+) Arthritis ,   Abdominal   Peds  Hematology negative hematology ROS (+) anemia ,   Anesthesia Other Findings   Reproductive/Obstetrics negative OB ROS                           Anesthesia Physical Anesthesia Plan  ASA: III  Anesthesia Plan: Regional and General   Post-op Pain Management: GA combined w/ Regional for post-op pain   Induction: Intravenous  Airway Management Planned: LMA  Additional Equipment:   Intra-op Plan:   Post-operative Plan:   Informed Consent: I have reviewed the patients History and Physical, chart, labs and discussed the procedure including the risks, benefits and alternatives for the proposed anesthesia with the patient or authorized representative who has indicated his/her understanding and acceptance.   Dental advisory given  Plan Discussed with: CRNA  Anesthesia Plan Comments:        Anesthesia Quick Evaluation

## 2015-07-09 ENCOUNTER — Encounter (HOSPITAL_COMMUNITY): Payer: Self-pay | Admitting: Orthopedic Surgery

## 2015-07-09 DIAGNOSIS — S42391K Other fracture of shaft of right humerus, subsequent encounter for fracture with nonunion: Secondary | ICD-10-CM | POA: Diagnosis not present

## 2015-07-09 MED ORDER — TRAMADOL HCL 50 MG PO TABS: 50.0000 mg | ORAL_TABLET | Freq: Four times a day (QID) | ORAL | Status: DC | PRN

## 2015-07-09 MED ORDER — HYDROMORPHONE HCL 2 MG PO TABS: 2.0000 mg | ORAL_TABLET | ORAL | Status: DC | PRN

## 2015-07-09 NOTE — Care Management Note (Signed)
Case Management Note  Patient Details  Name: Isabel Gonzalez MRN: [id number] Date of Birth: [birth year]  Subjective/Objective:     S/p ORIF right humerus               Action/Plan: Spoke with patient and her daughter about shower stool. Checked with DME company, not covered by insurance. Patient's daughter will get one elsewhere. No other discharge needs identified.   Expected Discharge Date:  07/09/15               Expected Discharge Plan:  Home/Self Care  In-House Referral:  NA  Discharge planning Services  CM Consult  Post Acute Care Choice:  NA Choice offered to:  NA  DME Arranged:  N/A DME Agency:  NA  HH Arranged:  NA HH Agency:  NA  Status of Service:  Completed, signed off  Medicare Important Message Given:    Date Medicare IM Given:    Medicare IM give by:    Date Additional Medicare IM Given:    Additional Medicare Important Message give by:     If discussed at [place] of Stay Meetings, dates discussed:    Additional Comments:  Nila Nephew, RN 07/09/2015, 11:19 AM

## 2015-07-09 NOTE — Discharge Summary (Signed)
PATIENT ID:      Isabel Gonzalez  MRN:     [id number] DOB/AGE:    71-Nov-1947 / 71 y.o.     DISCHARGE SUMMARY  ADMISSION DATE:    07/08/2015 DISCHARGE DATE:    ADMISSION DIAGNOSIS: right humeral shaft fx non-union Past Medical History  Diagnosis Date  . Murmur   . Hyperlipidemia   . Environmental allergies   . Anxiety   . Arthritis   . GERD (gastroesophageal reflux disease)   . Anemia   . Constipation   . Urinary problem   . Cancer (HCC)     squamous cell  . History of bladder infections   . Dysrhythmia     palpitations     DISCHARGE DIAGNOSIS:   Active Problems:   Fracture, humerus with nonunion  PROCEDURE: Procedure(s): OPEN REDUCTION INTERNAL FIXATION (ORIF) HUMERAL SHAFT FRACTURE with bone graft and allograft on 07/08/2015  CONSULTS:     HISTORY:  See H&P in chart.  HOSPITAL COURSE:  Ivane Heary is a 70 y.o. admitted on 07/08/2015 with a chief complaint of No chief complaint on file. , and found to have a diagnosis of right humeral shaft fx non-union.  They were brought to the operating room on 07/08/2015 and underwent Procedure(s): OPEN REDUCTION INTERNAL FIXATION (ORIF) HUMERAL SHAFT FRACTURE with bone graft and allograft.    They were given perioperative antibiotics: Anti-infectives    Start     Dose/Rate Route Frequency Ordered Stop   07/08/15 1900  ceFAZolin (ANCEF) IVPB 2 g/50 mL premix     2 g 100 mL/hr over 30 Minutes Intravenous Every 6 hours 07/08/15 1810 07/09/15 0613   07/08/15 1330  ceFAZolin (ANCEF) IVPB 2 g/50 mL premix     2 g 100 mL/hr over 30 Minutes Intravenous To [facility] 07/07/15 1356 07/08/15 1325    .  Patient underwent the above named procedure and tolerated it well. The following day they were hemodynamically stable and pain was controlled on oral analgesics. They were neurovascularly intact to the operative extremity. OT was ordered and worked with patient per protocol. They were medically and orthopaedically  stable for discharge on day1.    DIAGNOSTIC STUDIES:  RECENT RADIOGRAPHIC STUDIES :  Dg Humerus Right  07/08/2015  CLINICAL DATA:  ORIF RT Humerus LMP verified prior to exam, TMJ Radiation Safety Timeout performed by TMJ EXAM: DG C-ARM 61-120 MIN; RIGHT HUMERUS - 2+ VIEW COMPARISON:  None. FINDINGS: Patient has undergone ORIF of the mid right humerus. A sideplate and screws traverse but appears to be an oblique fracture. IMPRESSION: ORIF of the right humerus. Electronically Signed   By: Nolon Nations M.D.   On: 07/08/2015 15:26   Dg C-arm 1-60 Min  07/08/2015  CLINICAL DATA:  ORIF RT Humerus LMP verified prior to exam, TMJ Radiation Safety Timeout performed by TMJ EXAM: DG C-ARM 61-120 MIN; RIGHT HUMERUS - 2+ VIEW COMPARISON:  None. FINDINGS: Patient has undergone ORIF of the mid right humerus. A sideplate and screws traverse but appears to be an oblique fracture. IMPRESSION: ORIF of the right humerus. Electronically Signed   By: Nolon Nations M.D.   On: 07/08/2015 15:26    RECENT VITAL SIGNS:  Patient Vitals for the past 24 hrs:  BP Temp Temp src Pulse Resp SpO2 Height Weight  07/09/15 0414 (!) 114/44 mmHg 98.5 F (36.9 C) Oral 71 16 97 % - -  07/09/15 0008 (!) 106/42 mmHg 98.3 F (36.8 C) Axillary 67 16  96 % - -  07/08/15 2104 (!) 115/47 mmHg 98.4 F (36.9 C) Oral 78 16 98 % - -  07/08/15 2033 (!) 113/46 mmHg 98.1 F (36.7 C) Oral 77 - 100 % - -  07/08/15 1900 (!) 153/66 mmHg - - - - 100 % - -  07/08/15 1759 (!) 158/66 mmHg 97.7 F (36.5 C) - 71 14 100 % - -  07/08/15 1730 (!) 171/77 mmHg 97 F (36.1 C) - 69 14 100 % - -  07/08/15 1715 (!) 155/77 mmHg - - 64 13 99 % - -  07/08/15 1700 (!) 153/66 mmHg - - (!) 55 11 100 % - -  07/08/15 1645 (!) 162/68 mmHg - - (!) 59 (!) 4 100 % - -  07/08/15 1630 (!) 159/63 mmHg - - (!) 58 (!) 8 100 % - -  07/08/15 1615 (!) 160/77 mmHg - - 63 10 100 % - -  07/08/15 1600 (!) 145/58 mmHg - - 77 18 99 % - -  07/08/15 1545 132/76 mmHg 97.6 F  (36.4 C) - 79 10 96 % - -  07/08/15 1244 (!) 129/52 mmHg - - 75 11 100 % - -  07/08/15 1239 (!) 120/39 mmHg - - 72 17 100 % - -  07/08/15 1234 (!) 102/37 mmHg - - 70 14 100 % - -  07/08/15 1229 (!) 137/92 mmHg - - (!) 55 13 100 % - -  07/08/15 1224 (!) 151/75 mmHg - - 60 - 100 % - -  07/08/15 1140 (!) 148/65 mmHg 97.8 F (36.6 C) Oral (!) 51 20 100 % 5\' 3"  (1.6 m) 56.7 kg (125 lb)  .  RECENT EKG RESULTS:    Orders placed or performed during the hospital encounter of 07/02/15  . EKG 12-Lead  . EKG 12-Lead    DISCHARGE INSTRUCTIONS:  Discharge Instructions    Discontinue IV    Complete by:  As directed            DISCHARGE MEDICATIONS:     Medication List    STOP taking these medications        ibuprofen 200 MG tablet  Commonly known as:  ADVIL,MOTRIN      TAKE these medications        ALPRAZolam 0.5 MG tablet  Commonly known as:  XANAX  Take 0.5 tablets (0.25 mg total) by mouth daily as needed.     aspirin 81 MG EC tablet  Take 81 mg by mouth daily.     CALTRATE 600 PLUS-VIT D PO  Take 1 tablet by mouth daily.     cetirizine 10 MG tablet  Commonly known as:  ZYRTEC  Take 10 mg by mouth daily.     CULTURELLE DIGESTIVE HEALTH PO  Take 1 tablet by mouth daily.     diltiazem 30 MG tablet  Commonly known as:  CARDIZEM  Take 1 tablet (30 mg total) by mouth 3 (three) times daily as needed.     fluticasone 50 MCG/ACT nasal spray  Commonly known as:  FLONASE  Place 2 sprays into the nose as needed for allergies.     HYDROmorphone 2 MG tablet  Commonly known as:  DILAUDID  Take 1-2 tablets (2-4 mg total) by mouth every 4 (four) hours as needed for severe pain.     oseltamivir 75 MG capsule  Commonly known as:  TAMIFLU  Take 1 capsule (75 mg total) by mouth 2 (two) times daily.  traMADol 50 MG tablet  Commonly known as:  ULTRAM  Take 1-2 tablets (50-100 mg total) by mouth every 6 (six) hours as needed for moderate pain.     Vitamin D (Cholecalciferol)  1000 units Tabs  Take 1 tablet by mouth 2 (two) times daily.        FOLLOW UP VISIT:       Follow-up Information    Follow up with Metta Clines SUPPLE, MD.   Specialty:  Orthopedic Surgery   Why:  call to be seen in 10-14 days   Contact information:   [address] [place] [address] [phone]       DISCHARGE TO: home  DISPOSITION: good  DISCHARGE CONDITION:  Festus Barren for Dr. Justice Britain 07/09/2015, 8:12 AM

## 2015-07-09 NOTE — Anesthesia Postprocedure Evaluation (Signed)
Anesthesia Post Note  Patient: Isabel Gonzalez  Procedure(s) Performed: Procedure(s) (LRB): OPEN REDUCTION INTERNAL FIXATION (ORIF) HUMERAL SHAFT FRACTURE with bone graft and allograft (Right)  Patient location during evaluation: PACU Anesthesia Type: General and Regional Level of consciousness: sedated and patient cooperative Pain management: pain level controlled Vital Signs Assessment: post-procedure vital signs reviewed and stable Respiratory status: spontaneous breathing Cardiovascular status: stable Anesthetic complications: no    Last Vitals:  Filed Vitals:   07/09/15 0414 07/09/15 1201  BP: 114/44 114/50  Pulse: 71 64  Temp: 36.9 C   Resp: 16 20    Last Pain:  Filed Vitals:   07/09/15 1247  PainSc: [name]

## 2015-07-09 NOTE — Care Management Obs Status (Signed)
[facility] NOTIFICATION   Patient Details  Name: Madge Souffront MRN: [id number] Date of Birth: [birth year]   Medicare Observation Status Notification Given:  Yes    Nila Nephew, RN 07/09/2015, 11:31 AM

## 2015-07-09 NOTE — Progress Notes (Signed)
07/09/15  1230  Reviewed discharge instructions with patient. Patient verbalized understanding of discharge instructions.  Copy of discharge instructions and prescriptions given to patient.

## 2015-07-09 NOTE — Progress Notes (Signed)
Occupational Therapy Evaluation/Discharge Patient Details Name: Isabel Gonzalez MRN: [id number] DOB: May 23, 1946 Today's Date: 07/09/2015    History of Present Illness 70 y.o. female s/p ORIF with allograft bone grafting of R humeral shaft non-union fx. PMH significant for HLD, dysrhythmia, heart murmur, anxiety, arthritis, GERD, anemia, constipation, and squamous cell cancer.   Clinical Impression   PTA, pt required some assistance with ADLs and IADLs since humeral fx last May, but had learned to do most things 1-handed. Pt currently requires min assist for ADLs and is mod I for mobility. Reviewed sling wear protocol, positioning for sleep and sitting up, compensatory strategies for dressing and bathing, fall prevention, and pain/edema management. Educated and practiced HEP for AROM at elbow, wrist, hand with handout provided. Pt plans to d/c home with 24/7 assistance from her daughter until Sunday and then intermittent assistance whenever needed from friends and daughter. All education has been completed and pt has no further questions. Pt with no further acute OT needs. OT signing off. Thank you for this referral.    Follow Up Recommendations  Other (comment) (Outpatient OT at MD discretion)    Equipment Recommendations  Tub/shower seat    Recommendations for Other Services       Precautions / Restrictions Precautions Precautions: Shoulder Type of Shoulder Precautions: Conservative Protocol Shoulder Interventions: Shoulder sling/immobilizer;Off for dressing/bathing/exercises Precaution Booklet Issued: Yes (comment) Precaution Comments: No A/PROM at shoulder; AROM at elbow, wrist, hand Required Braces or Orthoses: Sling Restrictions Weight Bearing Restrictions: Yes RUE Weight Bearing: Non weight bearing      Mobility Bed Mobility Overal bed mobility: Modified Independent Bed Mobility: Supine to Sit     Supine to sit: Modified independent (Device/Increase time)      General bed mobility comments: HOB elevated 30 degress to simulate pt's use of several pillows at home, no use of bedrails. Increased time and effort, no physical assist required  Transfers Overall transfer level: Modified independent Equipment used: None                  Balance Overall balance assessment: No apparent balance deficits (not formally assessed)                                          ADL Overall ADL's : Needs assistance/impaired     Grooming: Wash/dry hands;Supervision/safety;Standing   Upper Body Bathing: Minimal assitance;With caregiver independent assisting;Standing;Cueing for UE precautions Upper Body Bathing Details (indicate cue type and reason): Cues to use thin wash cloth and avoid any movement at shoulder Lower Body Bathing: Minimal assistance;With caregiver independent assisting;Cueing for compensatory techniques;Sit to/from stand Lower Body Bathing Details (indicate cue type and reason): Cues to keep RUE supported  Upper Body Dressing : Minimal assistance;With caregiver independent assisting;Cueing for compensatory techniques;Standing Upper Body Dressing Details (indicate cue type and reason): Cues to dress RUE first to don clothing and to undress it last Lower Body Dressing: Minimal assistance;With caregiver independent assisting;Sit to/from stand   Toilet Transfer: Supervision/safety;Ambulation;Comfort height toilet   Toileting- Clothing Manipulation and Hygiene: Supervision/safety;Sit to/from stand       Functional mobility during ADLs: Supervision/safety General ADL Comments: Reviewed sling wear protocol, positioning for sleep and sitting, compensatory strategies for dressing and bathing, and fall prevention strategies. Practiced HEP for AROM at elbow, wrist, and hand with handout provided. Pt's daughter present for OT eval.     Vision Vision Assessment?:  No apparent visual deficits   Perception     Praxis       Pertinent Vitals/Pain Pain Assessment: 0-10 Pain Score: 4  Pain Location: R arm Pain Descriptors / Indicators: Aching;Sore Pain Intervention(s): Limited activity within patient's tolerance;Monitored during session;Premedicated before session;Repositioned;Ice applied     Hand Dominance Right   Extremity/Trunk Assessment Upper Extremity Assessment Upper Extremity Assessment: RUE deficits/detail RUE Deficits / Details: decreased ROM and strength as expected post op RUE: Unable to fully assess due to pain;Unable to fully assess due to immobilization   Lower Extremity Assessment Lower Extremity Assessment: Overall WFL for tasks assessed   Cervical / Trunk Assessment Cervical / Trunk Assessment: Normal   Communication Communication Communication: No difficulties   Cognition Arousal/Alertness: Awake/alert Behavior During Therapy: WFL for tasks assessed/performed Overall Cognitive Status: Within Functional Limits for tasks assessed                     General Comments       Exercises       Shoulder Instructions      Home Living Family/patient expects to be discharged to:: Private residence Living Arrangements: Alone Available Help at Discharge: Family;Available 24 hours/day;Friend(s) Type of Home: Apartment (condo) Home Access: Stairs to enter [employer] of Steps: 3 Entrance Stairs-Rails: Right;Left;Can reach both Home Layout: One level     Bathroom Shower/Tub: Walk-in shower;Door   [employer] Toilet: Handicapped height Bathroom Accessibility: Yes How Accessible: Accessible via walker Home Equipment: Hand held shower head;Adaptive equipment;Cane - single point Adaptive Equipment: Reacher Additional Comments: Pt's daughter will be staying with her thru Sunday and will be available to stay nights as needed. Pt has several friends that will assist as needed during the day.      Prior Functioning/Environment Level of Independence: Needs assistance   Gait / Transfers Assistance Needed: None ADL's / Homemaking Assistance Needed: Assist with bathing/dressing and IADLs since humeral fx last May        OT Diagnosis: Acute pain   OT Problem List: Decreased strength;Decreased range of motion;Decreased activity tolerance;Impaired balance (sitting and/or standing);Decreased coordination;Decreased safety awareness;Decreased knowledge of use of DME or AE;Decreased knowledge of precautions;Pain;Impaired UE functional use   OT Treatment/Interventions:      OT Goals(Current goals can be found in the care plan section) Acute Rehab OT Goals Patient Stated Goal: to go home OT Goal Formulation: With patient Time For Goal Achievement: 07/23/15 Potential to Achieve Goals: Good  OT Frequency:     Barriers to D/C:            Co-evaluation              End of Session Equipment Utilized During Treatment: Gait belt;Other (comment) (sling) Nurse Communication: Mobility status;Weight bearing status;Precautions  Activity Tolerance: Patient tolerated treatment well Patient left: in chair;with call bell/phone within reach;with family/visitor present   Time: [id number] OT Time Calculation (min): 37 min Charges:  OT General Charges $OT Visit: 1 Procedure OT Evaluation $OT Eval Moderate Complexity: 1 Procedure OT Treatments $Self Care/Home Management : 8-22 mins G-Codes: OT G-codes **NOT FOR INPATIENT CLASS** Functional Assessment Tool Used: clinical judgement Functional Limitation: Self care Self Care Current Status [id number]): At least 1 percent but less than 20 percent impaired, limited or restricted Self Care Goal Status [id number]): At least 1 percent but less than 20 percent impaired, limited or restricted Self Care Discharge Status [phone]): At least 1 percent but less than 20 percent impaired, limited or restricted  Redmond Baseman, OTR/L  Pager: [phone] 07/09/2015, 10:55 AM

## 2015-07-09 NOTE — Discharge Instructions (Signed)
° °  Metta Clines. Supple, M.D., F.A.A.O.S. Orthopaedic Surgery Specializing in Arthroscopic and Reconstructive Surgery of the Shoulder and Knee [phone] [address] [place], [place] [phone] - Fax [phone]   POST-OP  INSTRUCTIONS  1. Call the office at [phone] to schedule your first post-op appointment 10-14 days from the date of your surgery.  2. The bandage over your incision is waterproof. You may begin showering with this dressing on. You may leave this dressing on until first follow up appointment within 2 weeks. We prefer you leave this dressing in place until follow up however after 5-7 days if you are having itching or skin irritation and would like to remove it you may do so. Go slow and tug at the borders gently to break the bond the dressing has with the skin. At this point if there is no drainage it is okay to go without a bandage or you may cover it with a light guaze and tape. You can also expect significant bruising around your shoulder that will drift down your arm and into your chest wall. This is very normal and should resolve over several days.   3. Wear your sling/immobilizer at all times except to perform the exercises below or to occasionally let your arm dangle by your side to stretch your elbow. You also need to sleep in your sling immobilizer until instructed otherwise.  4. Range of motion to your elbow, wrist, and hand are encouraged 3-5 times daily. Exercise to your hand and fingers helps to reduce swelling you may experience.  5. Utilize ice to the shoulder 3-5 times minimum a day and additionally if you are experiencing pain.  6. Prescriptions for a pain medication and a muscle relaxant are provided for you. It is recommended that if you are experiencing pain that you pain medication alone is not controlling, add the muscle relaxant along with the pain medication which can give additional pain relief. The first 1-2 days is generally the most  severe of your pain and then should gradually decrease. As your pain lessens it is recommended that you decrease your use of the pain medications to an "as needed basis'" only and to always comply with the recommended dosages of the pain medications.  7. Pain medications can produce constipation along with their use. If you experience this, the use of an over the counter stool softener or laxative daily is recommended.   8. For most patients, if insurance allows, home health services to include therapy has been arranged.  9. For additional questions or concerns, please do not hesitate to call the office. If after hours there is an answering service to forward your concerns to the physician on call.

## 2015-07-12 ENCOUNTER — Encounter (HOSPITAL_COMMUNITY): Payer: Self-pay | Admitting: Orthopedic Surgery

## 2015-07-19 DIAGNOSIS — Z4789 Encounter for other orthopedic aftercare: Secondary | ICD-10-CM | POA: Diagnosis not present

## 2015-07-19 DIAGNOSIS — S42341K Displaced spiral fracture of shaft of humerus, right arm, subsequent encounter for fracture with nonunion: Secondary | ICD-10-CM | POA: Diagnosis not present

## 2015-08-09 ENCOUNTER — Other Ambulatory Visit: Payer: Self-pay | Admitting: Family Medicine

## 2015-08-09 DIAGNOSIS — J309 Allergic rhinitis, unspecified: Secondary | ICD-10-CM

## 2015-08-25 DIAGNOSIS — Z4789 Encounter for other orthopedic aftercare: Secondary | ICD-10-CM | POA: Diagnosis not present

## 2015-08-25 DIAGNOSIS — S42341K Displaced spiral fracture of shaft of humerus, right arm, subsequent encounter for fracture with nonunion: Secondary | ICD-10-CM | POA: Diagnosis not present

## 2015-09-15 ENCOUNTER — Telehealth: Payer: Self-pay | Admitting: Family Medicine

## 2015-09-15 NOTE — Telephone Encounter (Signed)
Talked with patient. Doing much better.  Will call if has any problem. Thanks.

## 2015-09-15 NOTE — Telephone Encounter (Signed)
Pt stated she was returning Dr. Sharyon Medicus call. Thanks TNP

## 2015-09-27 DIAGNOSIS — Z4789 Encounter for other orthopedic aftercare: Secondary | ICD-10-CM | POA: Diagnosis not present

## 2015-10-11 ENCOUNTER — Other Ambulatory Visit: Payer: Self-pay | Admitting: Family Medicine

## 2015-10-11 DIAGNOSIS — F419 Anxiety disorder, unspecified: Secondary | ICD-10-CM

## 2015-10-11 NOTE — Telephone Encounter (Signed)
Printed, please fax or call in to pharmacy. Thank you.   

## 2015-10-13 ENCOUNTER — Ambulatory Visit: Payer: Medicare PPO | Admitting: Nurse Practitioner

## 2015-12-06 DIAGNOSIS — M5136 Other intervertebral disc degeneration, lumbar region: Secondary | ICD-10-CM | POA: Diagnosis not present

## 2015-12-06 DIAGNOSIS — M542 Cervicalgia: Secondary | ICD-10-CM | POA: Diagnosis not present

## 2015-12-06 DIAGNOSIS — M5134 Other intervertebral disc degeneration, thoracic region: Secondary | ICD-10-CM | POA: Diagnosis not present

## 2015-12-06 DIAGNOSIS — M546 Pain in thoracic spine: Secondary | ICD-10-CM | POA: Diagnosis not present

## 2015-12-06 DIAGNOSIS — M25619 Stiffness of unspecified shoulder, not elsewhere classified: Secondary | ICD-10-CM | POA: Diagnosis not present

## 2015-12-06 DIAGNOSIS — M5030 Other cervical disc degeneration, unspecified cervical region: Secondary | ICD-10-CM | POA: Diagnosis not present

## 2015-12-06 DIAGNOSIS — M545 Low back pain: Secondary | ICD-10-CM | POA: Diagnosis not present

## 2015-12-06 DIAGNOSIS — M9902 Segmental and somatic dysfunction of thoracic region: Secondary | ICD-10-CM | POA: Diagnosis not present

## 2015-12-06 DIAGNOSIS — M9903 Segmental and somatic dysfunction of lumbar region: Secondary | ICD-10-CM | POA: Diagnosis not present

## 2015-12-06 DIAGNOSIS — M9901 Segmental and somatic dysfunction of cervical region: Secondary | ICD-10-CM | POA: Diagnosis not present

## 2015-12-07 DIAGNOSIS — M25619 Stiffness of unspecified shoulder, not elsewhere classified: Secondary | ICD-10-CM | POA: Diagnosis not present

## 2015-12-07 DIAGNOSIS — M545 Low back pain: Secondary | ICD-10-CM | POA: Diagnosis not present

## 2015-12-07 DIAGNOSIS — M9901 Segmental and somatic dysfunction of cervical region: Secondary | ICD-10-CM | POA: Diagnosis not present

## 2015-12-07 DIAGNOSIS — M9903 Segmental and somatic dysfunction of lumbar region: Secondary | ICD-10-CM | POA: Diagnosis not present

## 2015-12-07 DIAGNOSIS — M5134 Other intervertebral disc degeneration, thoracic region: Secondary | ICD-10-CM | POA: Diagnosis not present

## 2015-12-07 DIAGNOSIS — M5030 Other cervical disc degeneration, unspecified cervical region: Secondary | ICD-10-CM | POA: Diagnosis not present

## 2015-12-07 DIAGNOSIS — M542 Cervicalgia: Secondary | ICD-10-CM | POA: Diagnosis not present

## 2015-12-07 DIAGNOSIS — M546 Pain in thoracic spine: Secondary | ICD-10-CM | POA: Diagnosis not present

## 2015-12-07 DIAGNOSIS — M9902 Segmental and somatic dysfunction of thoracic region: Secondary | ICD-10-CM | POA: Diagnosis not present

## 2015-12-07 DIAGNOSIS — M5136 Other intervertebral disc degeneration, lumbar region: Secondary | ICD-10-CM | POA: Diagnosis not present

## 2015-12-08 DIAGNOSIS — M25619 Stiffness of unspecified shoulder, not elsewhere classified: Secondary | ICD-10-CM | POA: Diagnosis not present

## 2015-12-08 DIAGNOSIS — M5136 Other intervertebral disc degeneration, lumbar region: Secondary | ICD-10-CM | POA: Diagnosis not present

## 2015-12-08 DIAGNOSIS — M9901 Segmental and somatic dysfunction of cervical region: Secondary | ICD-10-CM | POA: Diagnosis not present

## 2015-12-08 DIAGNOSIS — M542 Cervicalgia: Secondary | ICD-10-CM | POA: Diagnosis not present

## 2015-12-08 DIAGNOSIS — M546 Pain in thoracic spine: Secondary | ICD-10-CM | POA: Diagnosis not present

## 2015-12-08 DIAGNOSIS — M9903 Segmental and somatic dysfunction of lumbar region: Secondary | ICD-10-CM | POA: Diagnosis not present

## 2015-12-08 DIAGNOSIS — M545 Low back pain: Secondary | ICD-10-CM | POA: Diagnosis not present

## 2015-12-08 DIAGNOSIS — M9902 Segmental and somatic dysfunction of thoracic region: Secondary | ICD-10-CM | POA: Diagnosis not present

## 2015-12-08 DIAGNOSIS — M5134 Other intervertebral disc degeneration, thoracic region: Secondary | ICD-10-CM | POA: Diagnosis not present

## 2015-12-08 DIAGNOSIS — M5030 Other cervical disc degeneration, unspecified cervical region: Secondary | ICD-10-CM | POA: Diagnosis not present

## 2015-12-09 DIAGNOSIS — M5030 Other cervical disc degeneration, unspecified cervical region: Secondary | ICD-10-CM | POA: Diagnosis not present

## 2015-12-09 DIAGNOSIS — M9902 Segmental and somatic dysfunction of thoracic region: Secondary | ICD-10-CM | POA: Diagnosis not present

## 2015-12-09 DIAGNOSIS — M9903 Segmental and somatic dysfunction of lumbar region: Secondary | ICD-10-CM | POA: Diagnosis not present

## 2015-12-09 DIAGNOSIS — M546 Pain in thoracic spine: Secondary | ICD-10-CM | POA: Diagnosis not present

## 2015-12-09 DIAGNOSIS — M9901 Segmental and somatic dysfunction of cervical region: Secondary | ICD-10-CM | POA: Diagnosis not present

## 2015-12-09 DIAGNOSIS — M5134 Other intervertebral disc degeneration, thoracic region: Secondary | ICD-10-CM | POA: Diagnosis not present

## 2015-12-09 DIAGNOSIS — M545 Low back pain: Secondary | ICD-10-CM | POA: Diagnosis not present

## 2015-12-09 DIAGNOSIS — M5136 Other intervertebral disc degeneration, lumbar region: Secondary | ICD-10-CM | POA: Diagnosis not present

## 2015-12-09 DIAGNOSIS — M25619 Stiffness of unspecified shoulder, not elsewhere classified: Secondary | ICD-10-CM | POA: Diagnosis not present

## 2015-12-09 DIAGNOSIS — M542 Cervicalgia: Secondary | ICD-10-CM | POA: Diagnosis not present

## 2015-12-10 DIAGNOSIS — M546 Pain in thoracic spine: Secondary | ICD-10-CM | POA: Diagnosis not present

## 2015-12-10 DIAGNOSIS — M5136 Other intervertebral disc degeneration, lumbar region: Secondary | ICD-10-CM | POA: Diagnosis not present

## 2015-12-10 DIAGNOSIS — M5134 Other intervertebral disc degeneration, thoracic region: Secondary | ICD-10-CM | POA: Diagnosis not present

## 2015-12-10 DIAGNOSIS — M25619 Stiffness of unspecified shoulder, not elsewhere classified: Secondary | ICD-10-CM | POA: Diagnosis not present

## 2015-12-10 DIAGNOSIS — M5030 Other cervical disc degeneration, unspecified cervical region: Secondary | ICD-10-CM | POA: Diagnosis not present

## 2015-12-10 DIAGNOSIS — M9901 Segmental and somatic dysfunction of cervical region: Secondary | ICD-10-CM | POA: Diagnosis not present

## 2015-12-10 DIAGNOSIS — M9903 Segmental and somatic dysfunction of lumbar region: Secondary | ICD-10-CM | POA: Diagnosis not present

## 2015-12-10 DIAGNOSIS — M542 Cervicalgia: Secondary | ICD-10-CM | POA: Diagnosis not present

## 2015-12-10 DIAGNOSIS — M9902 Segmental and somatic dysfunction of thoracic region: Secondary | ICD-10-CM | POA: Diagnosis not present

## 2015-12-10 DIAGNOSIS — M545 Low back pain: Secondary | ICD-10-CM | POA: Diagnosis not present

## 2015-12-20 DIAGNOSIS — M542 Cervicalgia: Secondary | ICD-10-CM | POA: Diagnosis not present

## 2015-12-20 DIAGNOSIS — M5136 Other intervertebral disc degeneration, lumbar region: Secondary | ICD-10-CM | POA: Diagnosis not present

## 2015-12-20 DIAGNOSIS — M5030 Other cervical disc degeneration, unspecified cervical region: Secondary | ICD-10-CM | POA: Diagnosis not present

## 2015-12-20 DIAGNOSIS — M25619 Stiffness of unspecified shoulder, not elsewhere classified: Secondary | ICD-10-CM | POA: Diagnosis not present

## 2015-12-20 DIAGNOSIS — M5134 Other intervertebral disc degeneration, thoracic region: Secondary | ICD-10-CM | POA: Diagnosis not present

## 2015-12-20 DIAGNOSIS — M9901 Segmental and somatic dysfunction of cervical region: Secondary | ICD-10-CM | POA: Diagnosis not present

## 2015-12-20 DIAGNOSIS — M545 Low back pain: Secondary | ICD-10-CM | POA: Diagnosis not present

## 2015-12-20 DIAGNOSIS — M546 Pain in thoracic spine: Secondary | ICD-10-CM | POA: Diagnosis not present

## 2015-12-20 DIAGNOSIS — M9902 Segmental and somatic dysfunction of thoracic region: Secondary | ICD-10-CM | POA: Diagnosis not present

## 2015-12-20 DIAGNOSIS — M9903 Segmental and somatic dysfunction of lumbar region: Secondary | ICD-10-CM | POA: Diagnosis not present

## 2015-12-21 DIAGNOSIS — M5134 Other intervertebral disc degeneration, thoracic region: Secondary | ICD-10-CM | POA: Diagnosis not present

## 2015-12-21 DIAGNOSIS — M9903 Segmental and somatic dysfunction of lumbar region: Secondary | ICD-10-CM | POA: Diagnosis not present

## 2015-12-21 DIAGNOSIS — M25619 Stiffness of unspecified shoulder, not elsewhere classified: Secondary | ICD-10-CM | POA: Diagnosis not present

## 2015-12-21 DIAGNOSIS — M9901 Segmental and somatic dysfunction of cervical region: Secondary | ICD-10-CM | POA: Diagnosis not present

## 2015-12-21 DIAGNOSIS — M5030 Other cervical disc degeneration, unspecified cervical region: Secondary | ICD-10-CM | POA: Diagnosis not present

## 2015-12-21 DIAGNOSIS — M542 Cervicalgia: Secondary | ICD-10-CM | POA: Diagnosis not present

## 2015-12-21 DIAGNOSIS — M546 Pain in thoracic spine: Secondary | ICD-10-CM | POA: Diagnosis not present

## 2015-12-21 DIAGNOSIS — M9902 Segmental and somatic dysfunction of thoracic region: Secondary | ICD-10-CM | POA: Diagnosis not present

## 2015-12-21 DIAGNOSIS — M5136 Other intervertebral disc degeneration, lumbar region: Secondary | ICD-10-CM | POA: Diagnosis not present

## 2015-12-21 DIAGNOSIS — M545 Low back pain: Secondary | ICD-10-CM | POA: Diagnosis not present

## 2015-12-22 DIAGNOSIS — M9902 Segmental and somatic dysfunction of thoracic region: Secondary | ICD-10-CM | POA: Diagnosis not present

## 2015-12-22 DIAGNOSIS — M5134 Other intervertebral disc degeneration, thoracic region: Secondary | ICD-10-CM | POA: Diagnosis not present

## 2015-12-22 DIAGNOSIS — M9903 Segmental and somatic dysfunction of lumbar region: Secondary | ICD-10-CM | POA: Diagnosis not present

## 2015-12-22 DIAGNOSIS — M9901 Segmental and somatic dysfunction of cervical region: Secondary | ICD-10-CM | POA: Diagnosis not present

## 2015-12-22 DIAGNOSIS — M5030 Other cervical disc degeneration, unspecified cervical region: Secondary | ICD-10-CM | POA: Diagnosis not present

## 2015-12-22 DIAGNOSIS — M546 Pain in thoracic spine: Secondary | ICD-10-CM | POA: Diagnosis not present

## 2015-12-22 DIAGNOSIS — M545 Low back pain: Secondary | ICD-10-CM | POA: Diagnosis not present

## 2015-12-22 DIAGNOSIS — M542 Cervicalgia: Secondary | ICD-10-CM | POA: Diagnosis not present

## 2015-12-22 DIAGNOSIS — M25619 Stiffness of unspecified shoulder, not elsewhere classified: Secondary | ICD-10-CM | POA: Diagnosis not present

## 2015-12-22 DIAGNOSIS — M5136 Other intervertebral disc degeneration, lumbar region: Secondary | ICD-10-CM | POA: Diagnosis not present

## 2015-12-23 DIAGNOSIS — M5134 Other intervertebral disc degeneration, thoracic region: Secondary | ICD-10-CM | POA: Diagnosis not present

## 2015-12-23 DIAGNOSIS — M546 Pain in thoracic spine: Secondary | ICD-10-CM | POA: Diagnosis not present

## 2015-12-23 DIAGNOSIS — M9901 Segmental and somatic dysfunction of cervical region: Secondary | ICD-10-CM | POA: Diagnosis not present

## 2015-12-23 DIAGNOSIS — M545 Low back pain: Secondary | ICD-10-CM | POA: Diagnosis not present

## 2015-12-23 DIAGNOSIS — M5136 Other intervertebral disc degeneration, lumbar region: Secondary | ICD-10-CM | POA: Diagnosis not present

## 2015-12-23 DIAGNOSIS — M9903 Segmental and somatic dysfunction of lumbar region: Secondary | ICD-10-CM | POA: Diagnosis not present

## 2015-12-23 DIAGNOSIS — M25619 Stiffness of unspecified shoulder, not elsewhere classified: Secondary | ICD-10-CM | POA: Diagnosis not present

## 2015-12-23 DIAGNOSIS — M5030 Other cervical disc degeneration, unspecified cervical region: Secondary | ICD-10-CM | POA: Diagnosis not present

## 2015-12-23 DIAGNOSIS — M542 Cervicalgia: Secondary | ICD-10-CM | POA: Diagnosis not present

## 2015-12-23 DIAGNOSIS — M9902 Segmental and somatic dysfunction of thoracic region: Secondary | ICD-10-CM | POA: Diagnosis not present

## 2015-12-24 DIAGNOSIS — M546 Pain in thoracic spine: Secondary | ICD-10-CM | POA: Diagnosis not present

## 2015-12-24 DIAGNOSIS — M5136 Other intervertebral disc degeneration, lumbar region: Secondary | ICD-10-CM | POA: Diagnosis not present

## 2015-12-24 DIAGNOSIS — M5134 Other intervertebral disc degeneration, thoracic region: Secondary | ICD-10-CM | POA: Diagnosis not present

## 2015-12-24 DIAGNOSIS — M9902 Segmental and somatic dysfunction of thoracic region: Secondary | ICD-10-CM | POA: Diagnosis not present

## 2015-12-24 DIAGNOSIS — M5030 Other cervical disc degeneration, unspecified cervical region: Secondary | ICD-10-CM | POA: Diagnosis not present

## 2015-12-24 DIAGNOSIS — M542 Cervicalgia: Secondary | ICD-10-CM | POA: Diagnosis not present

## 2015-12-24 DIAGNOSIS — M25619 Stiffness of unspecified shoulder, not elsewhere classified: Secondary | ICD-10-CM | POA: Diagnosis not present

## 2015-12-24 DIAGNOSIS — M9903 Segmental and somatic dysfunction of lumbar region: Secondary | ICD-10-CM | POA: Diagnosis not present

## 2015-12-24 DIAGNOSIS — M545 Low back pain: Secondary | ICD-10-CM | POA: Diagnosis not present

## 2015-12-24 DIAGNOSIS — M9901 Segmental and somatic dysfunction of cervical region: Secondary | ICD-10-CM | POA: Diagnosis not present

## 2016-01-12 DIAGNOSIS — L905 Scar conditions and fibrosis of skin: Secondary | ICD-10-CM | POA: Diagnosis not present

## 2016-01-12 DIAGNOSIS — Z85828 Personal history of other malignant neoplasm of skin: Secondary | ICD-10-CM | POA: Diagnosis not present

## 2016-01-12 DIAGNOSIS — L821 Other seborrheic keratosis: Secondary | ICD-10-CM | POA: Diagnosis not present

## 2016-01-12 DIAGNOSIS — L700 Acne vulgaris: Secondary | ICD-10-CM | POA: Diagnosis not present

## 2016-01-12 DIAGNOSIS — L578 Other skin changes due to chronic exposure to nonionizing radiation: Secondary | ICD-10-CM | POA: Diagnosis not present

## 2016-01-12 DIAGNOSIS — Z1283 Encounter for screening for malignant neoplasm of skin: Secondary | ICD-10-CM | POA: Diagnosis not present

## 2016-01-12 DIAGNOSIS — D485 Neoplasm of uncertain behavior of skin: Secondary | ICD-10-CM | POA: Diagnosis not present

## 2016-01-12 DIAGNOSIS — L812 Freckles: Secondary | ICD-10-CM | POA: Diagnosis not present

## 2016-01-12 DIAGNOSIS — D1800 Hemangioma unspecified site: Secondary | ICD-10-CM | POA: Diagnosis not present

## 2016-01-12 DIAGNOSIS — L570 Actinic keratosis: Secondary | ICD-10-CM | POA: Diagnosis not present

## 2016-01-12 DIAGNOSIS — D229 Melanocytic nevi, unspecified: Secondary | ICD-10-CM | POA: Diagnosis not present

## 2016-03-13 DIAGNOSIS — L821 Other seborrheic keratosis: Secondary | ICD-10-CM | POA: Diagnosis not present

## 2016-03-13 DIAGNOSIS — L219 Seborrheic dermatitis, unspecified: Secondary | ICD-10-CM | POA: Diagnosis not present

## 2016-03-13 DIAGNOSIS — M9901 Segmental and somatic dysfunction of cervical region: Secondary | ICD-10-CM | POA: Diagnosis not present

## 2016-03-13 DIAGNOSIS — M5030 Other cervical disc degeneration, unspecified cervical region: Secondary | ICD-10-CM | POA: Diagnosis not present

## 2016-03-13 DIAGNOSIS — M5136 Other intervertebral disc degeneration, lumbar region: Secondary | ICD-10-CM | POA: Diagnosis not present

## 2016-03-13 DIAGNOSIS — L812 Freckles: Secondary | ICD-10-CM | POA: Diagnosis not present

## 2016-03-13 DIAGNOSIS — L570 Actinic keratosis: Secondary | ICD-10-CM | POA: Diagnosis not present

## 2016-03-13 DIAGNOSIS — M25619 Stiffness of unspecified shoulder, not elsewhere classified: Secondary | ICD-10-CM | POA: Diagnosis not present

## 2016-03-13 DIAGNOSIS — M9903 Segmental and somatic dysfunction of lumbar region: Secondary | ICD-10-CM | POA: Diagnosis not present

## 2016-03-13 DIAGNOSIS — M546 Pain in thoracic spine: Secondary | ICD-10-CM | POA: Diagnosis not present

## 2016-03-13 DIAGNOSIS — M5134 Other intervertebral disc degeneration, thoracic region: Secondary | ICD-10-CM | POA: Diagnosis not present

## 2016-03-13 DIAGNOSIS — M542 Cervicalgia: Secondary | ICD-10-CM | POA: Diagnosis not present

## 2016-03-13 DIAGNOSIS — M545 Low back pain: Secondary | ICD-10-CM | POA: Diagnosis not present

## 2016-03-13 DIAGNOSIS — L578 Other skin changes due to chronic exposure to nonionizing radiation: Secondary | ICD-10-CM | POA: Diagnosis not present

## 2016-03-13 DIAGNOSIS — M9902 Segmental and somatic dysfunction of thoracic region: Secondary | ICD-10-CM | POA: Diagnosis not present

## 2016-03-16 DIAGNOSIS — M9902 Segmental and somatic dysfunction of thoracic region: Secondary | ICD-10-CM | POA: Diagnosis not present

## 2016-03-16 DIAGNOSIS — M9903 Segmental and somatic dysfunction of lumbar region: Secondary | ICD-10-CM | POA: Diagnosis not present

## 2016-03-16 DIAGNOSIS — M546 Pain in thoracic spine: Secondary | ICD-10-CM | POA: Diagnosis not present

## 2016-03-16 DIAGNOSIS — M9901 Segmental and somatic dysfunction of cervical region: Secondary | ICD-10-CM | POA: Diagnosis not present

## 2016-03-16 DIAGNOSIS — M5136 Other intervertebral disc degeneration, lumbar region: Secondary | ICD-10-CM | POA: Diagnosis not present

## 2016-03-16 DIAGNOSIS — M545 Low back pain: Secondary | ICD-10-CM | POA: Diagnosis not present

## 2016-03-16 DIAGNOSIS — M542 Cervicalgia: Secondary | ICD-10-CM | POA: Diagnosis not present

## 2016-03-16 DIAGNOSIS — M5134 Other intervertebral disc degeneration, thoracic region: Secondary | ICD-10-CM | POA: Diagnosis not present

## 2016-03-16 DIAGNOSIS — M25619 Stiffness of unspecified shoulder, not elsewhere classified: Secondary | ICD-10-CM | POA: Diagnosis not present

## 2016-03-16 DIAGNOSIS — M5030 Other cervical disc degeneration, unspecified cervical region: Secondary | ICD-10-CM | POA: Diagnosis not present

## 2016-03-20 DIAGNOSIS — M9901 Segmental and somatic dysfunction of cervical region: Secondary | ICD-10-CM | POA: Diagnosis not present

## 2016-03-20 DIAGNOSIS — M5134 Other intervertebral disc degeneration, thoracic region: Secondary | ICD-10-CM | POA: Diagnosis not present

## 2016-03-20 DIAGNOSIS — M546 Pain in thoracic spine: Secondary | ICD-10-CM | POA: Diagnosis not present

## 2016-03-20 DIAGNOSIS — M9903 Segmental and somatic dysfunction of lumbar region: Secondary | ICD-10-CM | POA: Diagnosis not present

## 2016-03-20 DIAGNOSIS — M545 Low back pain: Secondary | ICD-10-CM | POA: Diagnosis not present

## 2016-03-20 DIAGNOSIS — M25619 Stiffness of unspecified shoulder, not elsewhere classified: Secondary | ICD-10-CM | POA: Diagnosis not present

## 2016-03-20 DIAGNOSIS — M542 Cervicalgia: Secondary | ICD-10-CM | POA: Diagnosis not present

## 2016-03-20 DIAGNOSIS — M5030 Other cervical disc degeneration, unspecified cervical region: Secondary | ICD-10-CM | POA: Diagnosis not present

## 2016-03-20 DIAGNOSIS — M5136 Other intervertebral disc degeneration, lumbar region: Secondary | ICD-10-CM | POA: Diagnosis not present

## 2016-03-20 DIAGNOSIS — M9902 Segmental and somatic dysfunction of thoracic region: Secondary | ICD-10-CM | POA: Diagnosis not present

## 2016-03-22 DIAGNOSIS — M546 Pain in thoracic spine: Secondary | ICD-10-CM | POA: Diagnosis not present

## 2016-03-22 DIAGNOSIS — M25619 Stiffness of unspecified shoulder, not elsewhere classified: Secondary | ICD-10-CM | POA: Diagnosis not present

## 2016-03-22 DIAGNOSIS — M9902 Segmental and somatic dysfunction of thoracic region: Secondary | ICD-10-CM | POA: Diagnosis not present

## 2016-03-22 DIAGNOSIS — M9901 Segmental and somatic dysfunction of cervical region: Secondary | ICD-10-CM | POA: Diagnosis not present

## 2016-03-22 DIAGNOSIS — M542 Cervicalgia: Secondary | ICD-10-CM | POA: Diagnosis not present

## 2016-03-22 DIAGNOSIS — M9903 Segmental and somatic dysfunction of lumbar region: Secondary | ICD-10-CM | POA: Diagnosis not present

## 2016-03-22 DIAGNOSIS — M5134 Other intervertebral disc degeneration, thoracic region: Secondary | ICD-10-CM | POA: Diagnosis not present

## 2016-03-22 DIAGNOSIS — M5030 Other cervical disc degeneration, unspecified cervical region: Secondary | ICD-10-CM | POA: Diagnosis not present

## 2016-03-22 DIAGNOSIS — M5136 Other intervertebral disc degeneration, lumbar region: Secondary | ICD-10-CM | POA: Diagnosis not present

## 2016-03-22 DIAGNOSIS — M545 Low back pain: Secondary | ICD-10-CM | POA: Diagnosis not present

## 2016-03-27 ENCOUNTER — Encounter: Payer: Self-pay | Admitting: Physician Assistant

## 2016-03-27 ENCOUNTER — Ambulatory Visit (INDEPENDENT_AMBULATORY_CARE_PROVIDER_SITE_OTHER): Payer: PPO | Admitting: Physician Assistant

## 2016-03-27 VITALS — BP 100/70 | HR 75 | Temp 97.9°F | Resp 16 | Ht 63.0 in | Wt 134.4 lb

## 2016-03-27 DIAGNOSIS — F419 Anxiety disorder, unspecified: Secondary | ICD-10-CM | POA: Diagnosis not present

## 2016-03-27 DIAGNOSIS — Z0000 Encounter for general adult medical examination without abnormal findings: Secondary | ICD-10-CM | POA: Diagnosis not present

## 2016-03-27 DIAGNOSIS — R739 Hyperglycemia, unspecified: Secondary | ICD-10-CM | POA: Diagnosis not present

## 2016-03-27 DIAGNOSIS — Z833 Family history of diabetes mellitus: Secondary | ICD-10-CM

## 2016-03-27 DIAGNOSIS — J301 Allergic rhinitis due to pollen: Secondary | ICD-10-CM

## 2016-03-27 DIAGNOSIS — E538 Deficiency of other specified B group vitamins: Secondary | ICD-10-CM | POA: Diagnosis not present

## 2016-03-27 DIAGNOSIS — E7800 Pure hypercholesterolemia, unspecified: Secondary | ICD-10-CM | POA: Diagnosis not present

## 2016-03-27 MED ORDER — ALPRAZOLAM 0.5 MG PO TABS: ORAL_TABLET | ORAL | 5 refills | Status: DC

## 2016-03-27 MED ORDER — FLUTICASONE PROPIONATE 50 MCG/ACT NA SUSP: NASAL | 5 refills | Status: DC

## 2016-03-27 MED ORDER — VITAMIN B-12 1000 MCG PO TABS: 1000.0000 ug | ORAL_TABLET | Freq: Every day | ORAL | 0 refills | Status: AC

## 2016-03-27 NOTE — Progress Notes (Signed)
Patient: Isabel Gonzalez, Female    DOB: 23-Oct-1945, 70 y.o.   MRN: [id number] Visit Date: 03/27/2016  Today's Provider: [name], [name]   Chief Complaint  Patient presents with  . Medicare Wellness   Subjective:    Annual wellness visit Isabel Gonzalez is a 70 y.o. female. She feels well. She reports exercising. She reports she is sleeping well. She has no new complaints today.  Colonoscopy:04/29/2012; Normal repeat in 10 years; Patient has family history of colon cancer. BMD:04/01/2015 Tetanus: Per patient she was given the Tetanus in the hospital 18 months ago. Influenza Vaccine: Given at [facility] 02/2016 Mammogram: patient refuses -----------------------------------------------------------  Review of Systems  Constitutional: Negative.   HENT: Positive for voice change.   Eyes: Negative.   Respiratory: Negative.   Cardiovascular: Negative.   Gastrointestinal: Positive for constipation.  Endocrine: Negative.   Genitourinary: Positive for urgency.  Musculoskeletal: Negative.   Skin: Negative.   Allergic/Immunologic: Negative.   Neurological: Positive for dizziness and numbness.  Hematological: Negative.   Psychiatric/Behavioral: Negative.     Social History   Social History  . Marital status: Widowed    Spouse name: N/A  . Number of children: N/A  . Years of education: N/A   Occupational History  . retired    Social History Main Topics  . Smoking status: Former Smoker    Packs/day: 0.25    Years: 20.00    Quit date: 01/17/2013  . Smokeless tobacco: Never Used  . Alcohol use 1.2 oz/week    2 Glasses of wine per week     Comment: ocassionally  . Drug use: No  . Sexual activity: Not on file   Other Topics Concern  . Not on file   Social History Narrative   Regular exercise          Past Medical History:  Diagnosis Date  . Anemia   . Anxiety   . Arthritis   . Cancer (HCC)    squamous cell  . Constipation    . Dysrhythmia    palpitations   . Environmental allergies   . GERD (gastroesophageal reflux disease)   . History of bladder infections   . Hyperlipidemia   . Murmur   . Urinary problem      Patient Active Problem List   Diagnosis Date Noted  . Fracture, humerus 07/08/2015  . Humerus fracture 03/19/2015    Class: Status post  . Abdominal pain 03/18/2015  . Allergic rhinitis 03/18/2015  . Anxiety 03/18/2015  . Arthritis 03/18/2015  . Body mass index (BMI) of 23.0-23.9 in adult 03/18/2015  . Bursitis 03/18/2015  . Family history of diabetes mellitus 03/18/2015  . Acid reflux 03/18/2015  . Glaucoma 03/18/2015  . Hypercholesteremia 03/18/2015  . Blood glucose elevated 03/18/2015  . Headache, migraine 03/18/2015  . Awareness of heartbeats 03/18/2015  . Abdominal discomfort 01/17/2013  . Chest pain 01/23/2012  . HYPERLIPIDEMIA 08/23/2009  . PALPITATIONS 08/23/2009    Past Surgical History:  Procedure Laterality Date  . BREAST REDUCTION SURGERY    . FACIAL COSMETIC SURGERY    . ORIF HUMERUS FRACTURE Right 07/08/2015   Procedure: OPEN REDUCTION INTERNAL FIXATION (ORIF) HUMERAL SHAFT FRACTURE with bone graft and allograft;  Surgeon: Justice Britain, MD;  Location: [facility];  Service: Orthopedics;  Laterality: Right;  . SKIN SURGERY     squamous cell    Her family history includes Diabetes in her brother; Stroke (age of onset:  38) in her mother.    Current Meds  Medication Sig  . ALPRAZolam (XANAX) 0.5 MG tablet TAKE ONE-HALF TO ONE TABLET BY MOUTH EVERY DAY AS NEEDED  . aspirin 81 MG EC tablet Take 81 mg by mouth daily.   . Calcium-Vitamin D (CALTRATE 600 PLUS-VIT D PO) Take 1 tablet by mouth daily.   . cetirizine (ZYRTEC) 10 MG tablet Take 10 mg by mouth daily.    . fluticasone (FLONASE) 50 MCG/ACT nasal spray USE TWO PUFFS INTO EACH NOSTRIL DAILY  . Lactobacillus-Inulin (CULTURELLE DIGESTIVE HEALTH PO) Take 1 tablet by mouth daily.    Patient Care Team: [name], [name] as PCP - General (Family Medicine)    Objective:   Vitals: BP 100/70 (BP Location: Right Arm, Patient Position: Sitting, Cuff Size: Normal)   Pulse 75   Temp 97.9 F (36.6 C) (Oral)   Resp 16   Ht 5\' 3"  (1.6 m)   Wt 134 lb 6.4 oz (61 kg)   BMI 23.81 kg/m   Physical Exam  Constitutional: She is oriented to person, place, and time. She appears well-developed and well-nourished. No distress.  HENT:  Head: Normocephalic and atraumatic.  Right Ear: Hearing, external ear and ear canal normal.  Left Ear: Hearing, tympanic membrane, external ear and ear canal normal.  Nose: Nose normal.  Mouth/Throat: Uvula is midline, oropharynx is clear and moist and mucous membranes are normal. No oropharyngeal exudate.  Eyes: Conjunctivae and EOM are normal. Pupils are equal, round, and reactive to light. Right eye exhibits no discharge. Left eye exhibits no discharge. No scleral icterus.  Neck: Normal range of motion. Neck supple. No JVD present. No tracheal deviation present. No thyromegaly present.  Cardiovascular: Normal rate, regular rhythm, normal heart sounds and intact distal pulses.  Exam reveals no gallop and no friction rub.   No murmur heard. Pulmonary/Chest: Effort normal and breath sounds normal. No respiratory distress. She has no wheezes. She has no rales. She exhibits no tenderness.  Abdominal: Soft. Bowel sounds are normal. She exhibits no distension and no mass. There is no tenderness. There is no rebound and no guarding.  Musculoskeletal: Normal range of motion. She exhibits no edema or tenderness.  Lymphadenopathy:    She has no cervical adenopathy.  Neurological: She is alert and oriented to person, place, and time.  Skin: Skin is warm and dry. No rash noted. She is not diaphoretic.  Psychiatric: She has a normal mood and affect. Her behavior is normal. Judgment and thought content normal.  Vitals reviewed.   Activities of Daily Living In your present state of  health, do you have any difficulty performing the following activities: 03/27/2016 07/08/2015  Hearing? N N  Vision? N N  Difficulty concentrating or making decisions? N N  Walking or climbing stairs? N N  Dressing or bathing? N N  Doing errands, shopping? N N  Some recent data might be hidden    Fall Risk Assessment Fall Risk  03/27/2016 03/19/2015  Falls in the past year? No Yes  Number falls in past yr: - 1  Injury with Fall? - Yes     Depression Screen PHQ 2/9 Scores 03/27/2016 03/19/2015  PHQ - 2 Score 0 0    Cognitive Testing - 6-CIT  Correct? Score   What year is it? yes 0 0 or 4  What month is it? yes 0 0 or 3  Memorize:    Pia Mau,  [address]  [place],  What time is it? (within 1 hour) yes 0 0 or 3  Count backwards from 20 yes 0 0, 2, or 4  Name the months of the year yes 0 0, 2, or 4  Repeat name & address above no 3 0, 2, 4, 6, 8, or 10       TOTAL SCORE  3/28   Interpretation:  Normal  Normal (0-7) Abnormal (8-28)   Audit-C Alcohol Use Screening  Question Answer Points  How often do you have alcoholic drink? 4 or more times weekly 4  On days you do drink alcohol, how many drinks do you typically consume? 1-2 0  How oftey will you drink 6 or more in a total? never 0  Total Score:  4   A score of 3 or more in women, and 4 or more in men indicates increased risk for alcohol abuse, EXCEPT if all of the points are from question 1.     Assessment & Plan:     Annual Wellness Visit  Reviewed patient's Family Medical History Reviewed and updated list of patient's medical providers Assessment of cognitive impairment was done Assessed patient's functional ability Established a written schedule for health screening [place] Completed and Reviewed  Exercise Activities and Dietary recommendations Goals    . Exercise 150 minutes per week (moderate activity)       Immunization History  Administered Date(s) Administered    . Pneumococcal Conjugate-13 12/10/2013  . Pneumococcal Polysaccharide-23 06/02/2010  . Td 06/29/2003  . Zoster 04/18/2006    Health Maintenance  Topic Date Due  . Hepatitis C Screening  04/08/1946  . MAMMOGRAM  08/03/1994  . TETANUS/TDAP  06/28/2013  . INFLUENZA VACCINE  01/11/2016  . COLONOSCOPY  04/29/2022  . DEXA SCAN  Completed  . ZOSTAVAX  Completed  . PNA vac Low Risk Adult  Completed      Discussed health benefits of physical activity, and encouraged her to engage in regular exercise appropriate for her age and condition.  1. Medicare annual wellness visit, subsequent Normal physical exam.  2. Hypercholesteremia Previously elevated cholesterol. Intolerant to statins. Type of hypercholesterolemia elevates with oils such as fish oil. Will check labs as below and f/u pending results. - Comprehensive Metabolic Panel (CMET) - Lipid Profile  3. Blood glucose elevated Previous A1c was 5.8. Will check labs as below and f/u pending results. - CBC with Differential - Comprehensive Metabolic Panel (CMET) - TSH - HgB A1c  4. Family history of diabetes mellitus Will check labs as below and f/u pending results. - HgB A1c  5. B12 deficiency H/O def and on oral B12 supplement. Will check labs as below and f/u pending results. - B12 - vitamin B-12 (CYANOCOBALAMIN) 1000 MCG tablet; Take 1 tablet (1,000 mcg total) by mouth daily.  Dispense: 30 tablet; Refill: 0  6. Acute seasonal allergic rhinitis due to pollen Stable. Diagnosis pulled for medication refill. Continue current medical treatment plan. - fluticasone (FLONASE) 50 MCG/ACT nasal spray; USE TWO PUFFS INTO EACH NOSTRIL DAILY  Dispense: 16 g; Refill: 5  7. Anxiety Stable. Diagnosis pulled for medication refill. Continue current medical treatment plan. - ALPRAZolam (XANAX) 0.5 MG tablet; TAKE ONE-HALF TO ONE TABLET BY MOUTH EVERY DAY AS NEEDED  Dispense: 30 tablet; Refill: 5    ------------------------------------------------------------------------------------------------------------    [name], [name]  [facility]

## 2016-03-27 NOTE — Patient Instructions (Signed)

## 2016-03-29 ENCOUNTER — Telehealth: Payer: Self-pay

## 2016-03-29 DIAGNOSIS — M9902 Segmental and somatic dysfunction of thoracic region: Secondary | ICD-10-CM | POA: Diagnosis not present

## 2016-03-29 DIAGNOSIS — M546 Pain in thoracic spine: Secondary | ICD-10-CM | POA: Diagnosis not present

## 2016-03-29 DIAGNOSIS — M5136 Other intervertebral disc degeneration, lumbar region: Secondary | ICD-10-CM | POA: Diagnosis not present

## 2016-03-29 DIAGNOSIS — M5030 Other cervical disc degeneration, unspecified cervical region: Secondary | ICD-10-CM | POA: Diagnosis not present

## 2016-03-29 DIAGNOSIS — M9903 Segmental and somatic dysfunction of lumbar region: Secondary | ICD-10-CM | POA: Diagnosis not present

## 2016-03-29 DIAGNOSIS — M25619 Stiffness of unspecified shoulder, not elsewhere classified: Secondary | ICD-10-CM | POA: Diagnosis not present

## 2016-03-29 DIAGNOSIS — M9901 Segmental and somatic dysfunction of cervical region: Secondary | ICD-10-CM | POA: Diagnosis not present

## 2016-03-29 DIAGNOSIS — M542 Cervicalgia: Secondary | ICD-10-CM | POA: Diagnosis not present

## 2016-03-29 DIAGNOSIS — M545 Low back pain: Secondary | ICD-10-CM | POA: Diagnosis not present

## 2016-03-29 DIAGNOSIS — M5134 Other intervertebral disc degeneration, thoracic region: Secondary | ICD-10-CM | POA: Diagnosis not present

## 2016-03-29 LAB — CBC WITH DIFFERENTIAL/PLATELET
BASOS: 1 %
Basophils Absolute: 0 10*3/uL (ref 0.0–0.2)
EOS (ABSOLUTE): 0.1 10*3/uL (ref 0.0–0.4)
Eos: 2 %
HEMATOCRIT: 40.9 % (ref 34.0–46.6)
Hemoglobin: 13.6 g/dL (ref 11.1–15.9)
IMMATURE GRANS (ABS): 0 10*3/uL (ref 0.0–0.1)
Immature Granulocytes: 0 %
LYMPHS: 28 %
Lymphocytes Absolute: 1.7 10*3/uL (ref 0.7–3.1)
MCH: 31.3 pg (ref 26.6–33.0)
MCHC: 33.3 g/dL (ref 31.5–35.7)
MCV: 94 fL (ref 79–97)
Monocytes Absolute: 0.5 10*3/uL (ref 0.1–0.9)
Monocytes: 8 %
NEUTROS ABS: 3.7 10*3/uL (ref 1.4–7.0)
Neutrophils: 61 %
PLATELETS: 297 10*3/uL (ref 150–379)
RBC: 4.35 x10E6/uL (ref 3.77–5.28)
RDW: 13 % (ref 12.3–15.4)
WBC: 6 10*3/uL (ref 3.4–10.8)

## 2016-03-29 LAB — TSH: TSH: 1.51 u[IU]/mL (ref 0.450–4.500)

## 2016-03-29 LAB — LIPID PANEL
CHOL/HDL RATIO: 3.7 ratio (ref 0.0–4.4)
Cholesterol, Total: 233 mg/dL — ABNORMAL HIGH (ref 100–199)
HDL: 63 mg/dL (ref >39)
LDL Calculated: 145 mg/dL — ABNORMAL HIGH (ref 0–99)
Triglycerides: 124 mg/dL (ref 0–149)
VLDL Cholesterol Cal: 25 mg/dL (ref 5–40)

## 2016-03-29 LAB — COMPREHENSIVE METABOLIC PANEL
A/G RATIO: 1.9 (ref 1.2–2.2)
ALBUMIN: 4.6 g/dL (ref 3.5–4.8)
ALT: 25 IU/L (ref 0–32)
AST: 25 IU/L (ref 0–40)
Alkaline Phosphatase: 82 IU/L (ref 39–117)
BILIRUBIN TOTAL: 0.9 mg/dL (ref 0.0–1.2)
BUN / CREAT RATIO: 21 (ref 12–28)
BUN: 12 mg/dL (ref 8–27)
CHLORIDE: 100 mmol/L (ref 96–106)
CO2: 24 mmol/L (ref 18–29)
Calcium: 10 mg/dL (ref 8.7–10.3)
Creatinine, Ser: 0.57 mg/dL (ref 0.57–1.00)
GFR calc non Af Amer: 94 mL/min/{1.73_m2} (ref >59)
GFR, EST AFRICAN AMERICAN: 108 mL/min/{1.73_m2} (ref >59)
GLOBULIN, TOTAL: 2.4 g/dL (ref 1.5–4.5)
Glucose: 100 mg/dL — ABNORMAL HIGH (ref 65–99)
POTASSIUM: 4.8 mmol/L (ref 3.5–5.2)
SODIUM: 140 mmol/L (ref 134–144)
TOTAL PROTEIN: 7 g/dL (ref 6.0–8.5)

## 2016-03-29 LAB — VITAMIN B12

## 2016-03-29 LAB — HEMOGLOBIN A1C
ESTIMATED AVERAGE GLUCOSE: 114 mg/dL
HEMOGLOBIN A1C: 5.6 % (ref 4.8–5.6)

## 2016-03-29 NOTE — Telephone Encounter (Signed)
Patient advised as directed below.  Thanks,  -Manning Luna 

## 2016-03-29 NOTE — Telephone Encounter (Signed)
-----   Message from [name], [name] sent at 03/29/2016  9:21 AM EDT ----- All labs are within normal limits and stable.  Cholesterol and HgBA1c have improved. Continue healthy lifestyle modifications. B12 is normal now. May discontinue B12 supplement if wanted. Thanks! -JB

## 2016-04-07 DIAGNOSIS — M5030 Other cervical disc degeneration, unspecified cervical region: Secondary | ICD-10-CM | POA: Diagnosis not present

## 2016-04-07 DIAGNOSIS — M5136 Other intervertebral disc degeneration, lumbar region: Secondary | ICD-10-CM | POA: Diagnosis not present

## 2016-04-07 DIAGNOSIS — M5134 Other intervertebral disc degeneration, thoracic region: Secondary | ICD-10-CM | POA: Diagnosis not present

## 2016-04-07 DIAGNOSIS — M545 Low back pain: Secondary | ICD-10-CM | POA: Diagnosis not present

## 2016-04-07 DIAGNOSIS — M9903 Segmental and somatic dysfunction of lumbar region: Secondary | ICD-10-CM | POA: Diagnosis not present

## 2016-04-07 DIAGNOSIS — M25619 Stiffness of unspecified shoulder, not elsewhere classified: Secondary | ICD-10-CM | POA: Diagnosis not present

## 2016-04-07 DIAGNOSIS — M9902 Segmental and somatic dysfunction of thoracic region: Secondary | ICD-10-CM | POA: Diagnosis not present

## 2016-04-07 DIAGNOSIS — M9901 Segmental and somatic dysfunction of cervical region: Secondary | ICD-10-CM | POA: Diagnosis not present

## 2016-04-07 DIAGNOSIS — M542 Cervicalgia: Secondary | ICD-10-CM | POA: Diagnosis not present

## 2016-04-07 DIAGNOSIS — M546 Pain in thoracic spine: Secondary | ICD-10-CM | POA: Diagnosis not present

## 2016-04-10 DIAGNOSIS — M542 Cervicalgia: Secondary | ICD-10-CM | POA: Diagnosis not present

## 2016-04-10 DIAGNOSIS — M9903 Segmental and somatic dysfunction of lumbar region: Secondary | ICD-10-CM | POA: Diagnosis not present

## 2016-04-10 DIAGNOSIS — M9901 Segmental and somatic dysfunction of cervical region: Secondary | ICD-10-CM | POA: Diagnosis not present

## 2016-04-10 DIAGNOSIS — M5136 Other intervertebral disc degeneration, lumbar region: Secondary | ICD-10-CM | POA: Diagnosis not present

## 2016-04-10 DIAGNOSIS — M546 Pain in thoracic spine: Secondary | ICD-10-CM | POA: Diagnosis not present

## 2016-04-10 DIAGNOSIS — M5030 Other cervical disc degeneration, unspecified cervical region: Secondary | ICD-10-CM | POA: Diagnosis not present

## 2016-04-10 DIAGNOSIS — M9902 Segmental and somatic dysfunction of thoracic region: Secondary | ICD-10-CM | POA: Diagnosis not present

## 2016-04-10 DIAGNOSIS — M545 Low back pain: Secondary | ICD-10-CM | POA: Diagnosis not present

## 2016-04-10 DIAGNOSIS — M25619 Stiffness of unspecified shoulder, not elsewhere classified: Secondary | ICD-10-CM | POA: Diagnosis not present

## 2016-04-10 DIAGNOSIS — M5134 Other intervertebral disc degeneration, thoracic region: Secondary | ICD-10-CM | POA: Diagnosis not present

## 2016-04-11 ENCOUNTER — Encounter: Payer: Self-pay | Admitting: Cardiovascular Disease

## 2016-04-11 ENCOUNTER — Ambulatory Visit (INDEPENDENT_AMBULATORY_CARE_PROVIDER_SITE_OTHER): Payer: PPO | Admitting: Cardiovascular Disease

## 2016-04-11 VITALS — BP 160/70 | HR 70 | Resp 16 | Ht 63.0 in | Wt 135.0 lb

## 2016-04-11 DIAGNOSIS — E7800 Pure hypercholesterolemia, unspecified: Secondary | ICD-10-CM

## 2016-04-11 DIAGNOSIS — R002 Palpitations: Secondary | ICD-10-CM

## 2016-04-11 DIAGNOSIS — Z7689 Persons encountering health services in other specified circumstances: Secondary | ICD-10-CM | POA: Diagnosis not present

## 2016-04-11 DIAGNOSIS — I700 Atherosclerosis of aorta: Secondary | ICD-10-CM

## 2016-04-11 MED ORDER — EZETIMIBE 10 MG PO TABS: 10.0000 mg | ORAL_TABLET | Freq: Every day | ORAL | 11 refills | Status: DC

## 2016-04-11 MED ORDER — LOVASTATIN 20 MG PO TABS: 20.0000 mg | ORAL_TABLET | Freq: Every day | ORAL | 6 refills | Status: DC

## 2016-04-11 NOTE — Progress Notes (Signed)
Cardiology Office Note  Date:  04/11/2016   ID:  Isabel Gonzalez, DOB [birth year], MRN [id number]  PCP:  [name], [name]   Chief Complaint  Patient presents with  . Establish Care    HPI:  70 year old woman with past medical history of hyperlipidemia, remote smoking who stopped 25 years ago, tachypalpitations, mild aortic atherosclerosis seen on CT scan who presents for routine followup of her PAD, tachycardia  She reports that she has been doing well, no significant events She did fall while she was running steps May 2016 Suffered a Left humerus fx 10/2014 She has since recovered well, no complaints Denies any significant shortness of breath or chest discomfort Denies having significant tachycardia  Previously had trouble on Lipitor, Crestor, vytorin. Reports having muscle ache  Previous  CT scan from 01/17/2013 reviewed with her in detail today, images pulled up in the office  showed mild to moderate descending aortic atherosclerotic plaque   CT scan of abdomen pelvis with contrast at [facility] also documented aortic atherosclerosis  EKG shows normal sinus rhythm with rate 74 beats per minute with no significant ST or T wave changes   PMH:   has a past medical history of Anemia; Anxiety; Arthritis; Cancer ([place]); Constipation; Dysrhythmia; Environmental allergies; GERD (gastroesophageal reflux disease); History of bladder infections; Hyperlipidemia; Murmur; and Urinary problem.  PSH:    Past Surgical History:  Procedure Laterality Date  . BREAST REDUCTION SURGERY    . FACIAL COSMETIC SURGERY    . ORIF HUMERUS FRACTURE Right 07/08/2015   Procedure: OPEN REDUCTION INTERNAL FIXATION (ORIF) HUMERAL SHAFT FRACTURE with bone graft and allograft;  Surgeon: Justice Britain, MD;  Location: [place];  Service: Orthopedics;  Laterality: Right;  . SKIN SURGERY     squamous cell    Current Outpatient Prescriptions  Medication Sig Dispense Refill  . ALPRAZolam (XANAX) 0.5 MG  tablet TAKE ONE-HALF TO ONE TABLET BY MOUTH EVERY DAY AS NEEDED 30 tablet 5  . aspirin 81 MG EC tablet Take 81 mg by mouth daily.     . Calcium-Vitamin D (CALTRATE 600 PLUS-VIT D PO) Take 1 tablet by mouth daily.     . cetirizine (ZYRTEC) 10 MG tablet Take 10 mg by mouth daily.      Marland Kitchen diltiazem (CARDIZEM) 30 MG tablet Take 1 tablet (30 mg total) by mouth 3 (three) times daily as needed. 60 tablet 3  . fluticasone (FLONASE) 50 MCG/ACT nasal spray USE TWO PUFFS INTO EACH NOSTRIL DAILY 16 g 5  . Lactobacillus-Inulin (CULTURELLE DIGESTIVE HEALTH PO) Take 1 tablet by mouth daily.    Marland Kitchen oseltamivir (TAMIFLU) 75 MG capsule Take 1 capsule (75 mg total) by mouth 2 (two) times daily. 10 capsule 0  . vitamin B-12 (CYANOCOBALAMIN) 1000 MCG tablet Take 1 tablet (1,000 mcg total) by mouth daily. 30 tablet 0  . Vitamin D, Cholecalciferol, 1000 UNITS TABS Take 1 tablet by mouth 2 (two) times daily.    Marland Kitchen ezetimibe (ZETIA) 10 MG tablet Take 1 tablet (10 mg total) by mouth daily. 30 tablet 11  . lovastatin (MEVACOR) 20 MG tablet Take 1 tablet (20 mg total) by mouth at bedtime. 30 tablet 6   No current facility-administered medications for this visit.      Allergies:   Percocet [oxycodone-acetaminophen] and Vicodin [hydrocodone-acetaminophen]   Social History:  The patient  reports that she quit smoking about 3 years ago. She has a 5.00 pack-year smoking history. She has never used smokeless tobacco. She reports  that she drinks about 1.2 oz of alcohol per week . She reports that she does not use drugs.   Family History:   family history includes Diabetes in her brother; Stroke (age of onset: 81) in her mother.    Review of Systems: Review of Systems  Constitutional: Negative.   Respiratory: Negative.   Cardiovascular: Negative.   Gastrointestinal: Negative.   Musculoskeletal: Negative.   Neurological: Negative.   Psychiatric/Behavioral: Negative.   All other systems reviewed and are  negative.    PHYSICAL EXAM: Vitals:   04/11/16 1736  Weight: 135 lb (61.2 kg)  Height: 5\' 3"  (1.6 m)  Blood pressure 160/70, heart rate 70, saturations 98%  GEN: Well nourished, well developed, in no acute distress  HEENT: normal  Neck: no JVD, carotid bruits, or masses Cardiac: RRR; no murmurs, rubs, or gallops,no edema  Respiratory:  clear to auscultation bilaterally, normal work of breathing GI: soft, nontender, nondistended, + BS MS: no deformity or atrophy  Skin: warm and dry, no rash Neuro:  Strength and sensation are intact Psych: euthymic mood, full affect    Recent Labs: 07/02/2015: Hemoglobin 13.8 03/27/2016: ALT 25; BUN 12; Creatinine, Ser 0.57; Platelets 297; Potassium 4.8; Sodium 140; TSH 1.510    Lipid Panel Lab Results  Component Value Date   CHOL 233 (H) 03/27/2016   HDL 63 03/27/2016   LDLCALC 145 (H) 03/27/2016   TRIG 124 03/27/2016      Wt Readings from Last 3 Encounters:  04/11/16 135 lb (61.2 kg)  03/27/16 134 lb 6.4 oz (61 kg)  07/08/15 125 lb (56.7 kg)       ASSESSMENT AND PLAN:  Encounter to establish care with new doctor - Plan: EKG 12-Lead Several years since we have seen her, last seen in 2014 Long time spent discussing events over the past several years  Hypercholesteremia - Plan: EKG 12-Lead Long discussion concerning her cholesterol Discussed various treatment options for her cholesterol. She would like to do something but has trouble with statins. She tried Lipitor, Crestor, Vytorin Recommended she try low-dose lovastatin 2 or 3 days per week Then would add Zetia daily  Aortic atherosclerosis ([place]) - Plan: EKG 12-Lead CT scan images reviewed again from several years ago showing aortic atherosclerosis  Palpitations - Plan: EKG 12-Lead Denies having significant palpitations. No changes to her medications   Total encounter time more than 25 minutes  Greater than 50% was spent in counseling and coordination of care with the  patient   Disposition:   F/U  6 months   Orders Placed This Encounter  Procedures  . EKG 12-Lead     Signed, Esmond Plants, M.D., Ph.D. 04/11/2016  [place], [place]

## 2016-04-11 NOTE — Patient Instructions (Addendum)
Medication Instructions:   Please start lovastatin very slowly When ready add zetia one a day  Labwork:  No new labs needed  Testing/Procedures:  No further testing at this time  Research CT coronary calcium score $150  Follow-Up: It was a pleasure seeing you in the office today. Please call us if you have new issues that need to be addressed before your next appt.  [phone]  Your physician wants you to follow-up in: 12 months.  You will receive a reminder letter in the mail two months in advance. If you don't receive a letter, please call our office to schedule the follow-up appointment.  If you need a refill on your cardiac medications before your next appointment, please call your pharmacy.

## 2016-04-13 DIAGNOSIS — M9903 Segmental and somatic dysfunction of lumbar region: Secondary | ICD-10-CM | POA: Diagnosis not present

## 2016-04-13 DIAGNOSIS — M9902 Segmental and somatic dysfunction of thoracic region: Secondary | ICD-10-CM | POA: Diagnosis not present

## 2016-04-13 DIAGNOSIS — M25619 Stiffness of unspecified shoulder, not elsewhere classified: Secondary | ICD-10-CM | POA: Diagnosis not present

## 2016-04-13 DIAGNOSIS — M5030 Other cervical disc degeneration, unspecified cervical region: Secondary | ICD-10-CM | POA: Diagnosis not present

## 2016-04-13 DIAGNOSIS — M546 Pain in thoracic spine: Secondary | ICD-10-CM | POA: Diagnosis not present

## 2016-04-13 DIAGNOSIS — M542 Cervicalgia: Secondary | ICD-10-CM | POA: Diagnosis not present

## 2016-04-13 DIAGNOSIS — M5134 Other intervertebral disc degeneration, thoracic region: Secondary | ICD-10-CM | POA: Diagnosis not present

## 2016-04-13 DIAGNOSIS — M5136 Other intervertebral disc degeneration, lumbar region: Secondary | ICD-10-CM | POA: Diagnosis not present

## 2016-04-13 DIAGNOSIS — M545 Low back pain: Secondary | ICD-10-CM | POA: Diagnosis not present

## 2016-04-13 DIAGNOSIS — M9901 Segmental and somatic dysfunction of cervical region: Secondary | ICD-10-CM | POA: Diagnosis not present

## 2016-04-18 ENCOUNTER — Telehealth: Payer: Self-pay | Admitting: *Deleted

## 2016-04-18 NOTE — Telephone Encounter (Signed)
Pt requiring PA for Zetia 10 mg tablet.  PA has been forwarded through covermymeds. Awaiting approval

## 2016-04-20 NOTE — Telephone Encounter (Signed)
Pt has been approved for Zetia 10 mg 04/19/16-06/11/17.

## 2016-04-24 DIAGNOSIS — L578 Other skin changes due to chronic exposure to nonionizing radiation: Secondary | ICD-10-CM | POA: Diagnosis not present

## 2016-04-24 DIAGNOSIS — L570 Actinic keratosis: Secondary | ICD-10-CM | POA: Diagnosis not present

## 2016-04-24 DIAGNOSIS — L821 Other seborrheic keratosis: Secondary | ICD-10-CM | POA: Diagnosis not present

## 2016-04-25 DIAGNOSIS — M5134 Other intervertebral disc degeneration, thoracic region: Secondary | ICD-10-CM | POA: Diagnosis not present

## 2016-04-25 DIAGNOSIS — M546 Pain in thoracic spine: Secondary | ICD-10-CM | POA: Diagnosis not present

## 2016-04-25 DIAGNOSIS — M5030 Other cervical disc degeneration, unspecified cervical region: Secondary | ICD-10-CM | POA: Diagnosis not present

## 2016-04-25 DIAGNOSIS — M25619 Stiffness of unspecified shoulder, not elsewhere classified: Secondary | ICD-10-CM | POA: Diagnosis not present

## 2016-04-25 DIAGNOSIS — M545 Low back pain: Secondary | ICD-10-CM | POA: Diagnosis not present

## 2016-04-25 DIAGNOSIS — M9901 Segmental and somatic dysfunction of cervical region: Secondary | ICD-10-CM | POA: Diagnosis not present

## 2016-04-25 DIAGNOSIS — M9902 Segmental and somatic dysfunction of thoracic region: Secondary | ICD-10-CM | POA: Diagnosis not present

## 2016-04-25 DIAGNOSIS — M9903 Segmental and somatic dysfunction of lumbar region: Secondary | ICD-10-CM | POA: Diagnosis not present

## 2016-04-25 DIAGNOSIS — M5136 Other intervertebral disc degeneration, lumbar region: Secondary | ICD-10-CM | POA: Diagnosis not present

## 2016-04-25 DIAGNOSIS — M542 Cervicalgia: Secondary | ICD-10-CM | POA: Diagnosis not present

## 2016-05-18 DIAGNOSIS — S61211A Laceration without foreign body of left index finger without damage to nail, initial encounter: Secondary | ICD-10-CM | POA: Diagnosis not present

## 2016-05-18 DIAGNOSIS — R030 Elevated blood-pressure reading, without diagnosis of hypertension: Secondary | ICD-10-CM | POA: Diagnosis not present

## 2016-05-23 ENCOUNTER — Encounter: Payer: Self-pay | Admitting: Physician Assistant

## 2016-05-23 ENCOUNTER — Ambulatory Visit (INDEPENDENT_AMBULATORY_CARE_PROVIDER_SITE_OTHER): Payer: PPO | Admitting: Physician Assistant

## 2016-05-23 VITALS — BP 150/70 | HR 78 | Temp 97.5°F | Resp 16 | Wt 134.8 lb

## 2016-05-23 DIAGNOSIS — R030 Elevated blood-pressure reading, without diagnosis of hypertension: Secondary | ICD-10-CM | POA: Diagnosis not present

## 2016-05-23 NOTE — Progress Notes (Signed)
Patient: Isabel Gonzalez Female    DOB: 1946/06/04   70 y.o.   MRN: [id number] Visit Date: 05/23/2016  Today's Provider: [name], [name]   Chief Complaint  Patient presents with  . Blood Pressure Check   Subjective:    HPI Patient came in to office wanting her blood pressure checked. Blood pressure reading was 150/70. She reports that she started to have some tingling in her jaw area, feeling dizzy and she has been having this tick on her left eye for the past month and feeling fatigue. She decided to check her blood pressure because she notice her blood pressure was running high on other doctors office. She reports that yesterday night her blood pressure was 188/80's and this morning was 163/78. She denies chest pain,leg swelling or lightheadedness. Previous blood pressure readings even back in October have been low at 100/70. She does have Cardizem on her medication list but she does not take this regularly. She only uses this as needed for palpitations and states that she is only taking this medication may be once per year.    Allergies  Allergen Reactions  . Percocet [Oxycodone-Acetaminophen] Nausea And Vomiting  . Vicodin [Hydrocodone-Acetaminophen] Nausea And Vomiting     Current Outpatient Prescriptions:  .  ALPRAZolam (XANAX) 0.5 MG tablet, TAKE ONE-HALF TO ONE TABLET BY MOUTH EVERY DAY AS NEEDED, Disp: 30 tablet, Rfl: 5 .  aspirin 81 MG EC tablet, Take 81 mg by mouth daily. , Disp: , Rfl:  .  Calcium-Vitamin D (CALTRATE 600 PLUS-VIT D PO), Take 1 tablet by mouth daily. , Disp: , Rfl:  .  cetirizine (ZYRTEC) 10 MG tablet, Take 10 mg by mouth daily.  , Disp: , Rfl:  .  diltiazem (CARDIZEM) 30 MG tablet, Take 1 tablet (30 mg total) by mouth 3 (three) times daily as needed., Disp: 60 tablet, Rfl: 3 .  ezetimibe (ZETIA) 10 MG tablet, Take 1 tablet (10 mg total) by mouth daily., Disp: 30 tablet, Rfl: 11 .  fluticasone (FLONASE) 50 MCG/ACT nasal spray, USE TWO  PUFFS INTO EACH NOSTRIL DAILY, Disp: 16 g, Rfl: 5 .  Lactobacillus-Inulin ([place] PO), Take 1 tablet by mouth daily., Disp: , Rfl:  .  lovastatin (MEVACOR) 20 MG tablet, Take 1 tablet (20 mg total) by mouth at bedtime., Disp: 30 tablet, Rfl: 6 .  vitamin B-12 (CYANOCOBALAMIN) 1000 MCG tablet, Take 1 tablet (1,000 mcg total) by mouth daily., Disp: 30 tablet, Rfl: 0 .  Vitamin D, Cholecalciferol, 1000 UNITS TABS, Take 1 tablet by mouth 2 (two) times daily., Disp: , Rfl:  .  oseltamivir (TAMIFLU) 75 MG capsule, Take 1 capsule (75 mg total) by mouth 2 (two) times daily. (Patient not taking: Reported on 05/23/2016), Disp: 10 capsule, Rfl: 0  Review of Systems  Constitutional: Positive for fatigue.  HENT: Negative.   Respiratory: Negative for cough, chest tightness, shortness of breath and wheezing.   Cardiovascular: Negative for chest pain, palpitations and leg swelling.  Neurological: Positive for dizziness. Negative for weakness, light-headedness and headaches.  Psychiatric/Behavioral: Negative for decreased concentration and sleep disturbance. The patient is not nervous/anxious.     Social History  Substance Use Topics  . Smoking status: Former Smoker    Packs/day: 0.25    Years: 20.00    Quit date: 01/17/2013  . Smokeless tobacco: Never Used  . Alcohol use 1.2 oz/week    2 Glasses of wine per week  Comment: ocassionally   Objective:   BP (!) 150/70 (BP Location: Right Arm, Patient Position: Sitting, Cuff Size: Normal)   Pulse 78   Temp 97.5 F (36.4 C) (Oral)   Resp 16   Wt 134 lb 12.8 oz (61.1 kg)   BMI 23.88 kg/m   Physical Exam  Constitutional: She appears well-developed and well-nourished. No distress.  Neck: Normal range of motion. Neck supple. No JVD present. No tracheal deviation present. No thyromegaly present.  Cardiovascular: Normal rate, regular rhythm and normal heart sounds.  Exam reveals no gallop and no friction rub.   No murmur  heard. Pulmonary/Chest: Effort normal and breath sounds normal. No respiratory distress. She has no wheezes. She has no rales.  Lymphadenopathy:    She has no cervical adenopathy.  Skin: She is not diaphoretic.  Vitals reviewed.     Assessment & Plan:     1. Elevated blood pressure reading She reports that she thinks her blood pressure is elevated secondary to increased stress. She has had a lot of events that she has booked for herself through the holiday season and feels that she is just overbooked. She is not having any symptoms with high blood pressure at this time. I will have her discontinue Zyrtec to make sure that that is not the cause of her blood pressure issues. She is to take her blood pressure at home over the next week and create a blood pressure log. I will see her in 1 week to  to go over the blood pressure log and discuss treatment if readings are elevated.        [name], [name]  [facility]

## 2016-05-23 NOTE — Patient Instructions (Signed)
Hypertension Hypertension, commonly called high blood pressure, is when the force of blood pumping through your arteries is too strong. Your arteries are the blood vessels that carry blood from your heart throughout your body. A blood pressure reading consists of a higher number over a lower number, such as 110/72. The higher number (systolic) is the pressure inside your arteries when your heart pumps. The lower number (diastolic) is the pressure inside your arteries when your heart relaxes. Ideally you want your blood pressure below 120/80. Hypertension forces your heart to work harder to pump blood. Your arteries may become narrow or stiff. Having untreated or uncontrolled hypertension can cause heart attack, stroke, kidney disease, and other problems. What increases the risk? Some risk factors for high blood pressure are controllable. Others are not. Risk factors you cannot control include:  Race. You may be at higher risk if you are African American.  Age. Risk increases with age.  Gender. Men are at higher risk than women before age 45 years. After age 65, women are at higher risk than men. Risk factors you can control include:  Not getting enough exercise or physical activity.  Being overweight.  Getting too much fat, sugar, calories, or salt in your diet.  Drinking too much alcohol. What are the signs or symptoms? Hypertension does not usually cause signs or symptoms. Extremely high blood pressure (hypertensive crisis) may cause headache, anxiety, shortness of breath, and nosebleed. How is this diagnosed? To check if you have hypertension, your health care provider will measure your blood pressure while you are seated, with your arm held at the level of your heart. It should be measured at least twice using the same arm. Certain conditions can cause a difference in blood pressure between your right and left arms. A blood pressure reading that is higher than normal on one occasion does  not mean that you need treatment. If it is not clear whether you have high blood pressure, you may be asked to return on a different day to have your blood pressure checked again. Or, you may be asked to monitor your blood pressure at home for 1 or more weeks. How is this treated? Treating high blood pressure includes making lifestyle changes and possibly taking medicine. Living a healthy lifestyle can help lower high blood pressure. You may need to change some of your habits. Lifestyle changes may include:  Following the DASH diet. This diet is high in fruits, vegetables, and whole grains. It is low in salt, red meat, and added sugars.  Keep your sodium intake below 2,300 mg per day.  Getting at least 30-45 minutes of aerobic exercise at least 4 times per week.  Losing weight if necessary.  Not smoking.  Limiting alcoholic beverages.  Learning ways to reduce stress. Your health care provider may prescribe medicine if lifestyle changes are not enough to get your blood pressure under control, and if one of the following is true:  You are 18-59 years of age and your systolic blood pressure is above 140.  You are 60 years of age or older, and your systolic blood pressure is above 150.  Your diastolic blood pressure is above 90.  You have diabetes, and your systolic blood pressure is over 140 or your diastolic blood pressure is over 90.  You have kidney disease and your blood pressure is above 140/90.  You have heart disease and your blood pressure is above 140/90. Your personal target blood pressure may vary depending on your medical   conditions, your age, and other factors. Follow these instructions at home:  Have your blood pressure rechecked as directed by your health care provider.  Take medicines only as directed by your health care provider. Follow the directions carefully. Blood pressure medicines must be taken as prescribed. The medicine does not work as well when you skip  doses. Skipping doses also puts you at risk for problems.  Do not smoke.  Monitor your blood pressure at home as directed by your health care provider. Contact a health care provider if:  You think you are having a reaction to medicines taken.  You have recurrent headaches or feel dizzy.  You have swelling in your ankles.  You have trouble with your vision. Get help right away if:  You develop a severe headache or confusion.  You have unusual weakness, numbness, or feel faint.  You have severe chest or abdominal pain.  You vomit repeatedly.  You have trouble breathing. This information is not intended to replace advice given to you by your health care provider. Make sure you discuss any questions you have with your health care provider. Document Released: 05/29/2005 Document Revised: 11/04/2015 Document Reviewed: 03/21/2013 Elsevier Interactive Patient Education  2017 Elsevier Inc.  

## 2016-05-30 ENCOUNTER — Ambulatory Visit: Payer: PPO | Admitting: Physician Assistant

## 2016-07-13 DIAGNOSIS — L570 Actinic keratosis: Secondary | ICD-10-CM | POA: Diagnosis not present

## 2016-07-13 DIAGNOSIS — L821 Other seborrheic keratosis: Secondary | ICD-10-CM | POA: Diagnosis not present

## 2016-07-13 DIAGNOSIS — L578 Other skin changes due to chronic exposure to nonionizing radiation: Secondary | ICD-10-CM | POA: Diagnosis not present

## 2016-08-10 ENCOUNTER — Encounter: Payer: Self-pay | Admitting: Physician Assistant

## 2016-08-10 ENCOUNTER — Ambulatory Visit (INDEPENDENT_AMBULATORY_CARE_PROVIDER_SITE_OTHER): Payer: PPO | Admitting: Physician Assistant

## 2016-08-10 VITALS — BP 128/80 | HR 99 | Temp 98.5°F | Resp 16 | Wt 134.8 lb

## 2016-08-10 DIAGNOSIS — J301 Allergic rhinitis due to pollen: Secondary | ICD-10-CM | POA: Diagnosis not present

## 2016-08-10 DIAGNOSIS — B9789 Other viral agents as the cause of diseases classified elsewhere: Secondary | ICD-10-CM | POA: Diagnosis not present

## 2016-08-10 DIAGNOSIS — J069 Acute upper respiratory infection, unspecified: Secondary | ICD-10-CM

## 2016-08-10 DIAGNOSIS — N390 Urinary tract infection, site not specified: Secondary | ICD-10-CM

## 2016-08-10 MED ORDER — CIPROFLOXACIN HCL 250 MG PO TABS: 250.0000 mg | ORAL_TABLET | Freq: Two times a day (BID) | ORAL | 5 refills | Status: DC

## 2016-08-10 MED ORDER — FLUTICASONE PROPIONATE 50 MCG/ACT NA SUSP: NASAL | 5 refills | Status: DC

## 2016-08-10 NOTE — Patient Instructions (Signed)
Upper Respiratory Infection, Adult Most upper respiratory infections (URIs) are a viral infection of the air passages leading to the lungs. A URI affects the nose, throat, and upper air passages. The most common type of URI is nasopharyngitis and is typically referred to as "the common cold." URIs run their course and usually go away on their own. Most of the time, a URI does not require medical attention, but sometimes a bacterial infection in the upper airways can follow a viral infection. This is called a secondary infection. Sinus and middle ear infections are common types of secondary upper respiratory infections. Bacterial pneumonia can also complicate a URI. A URI can worsen asthma and chronic obstructive pulmonary disease (COPD). Sometimes, these complications can require emergency medical care and may be life threatening. What are the causes? Almost all URIs are caused by viruses. A virus is a type of germ and can spread from one person to another. What increases the risk? You may be at risk for a URI if:  You smoke.  You have chronic heart or lung disease.  You have a weakened defense (immune) system.  You are very young or very old.  You have nasal allergies or asthma.  You work in crowded or poorly ventilated areas.  You work in health care facilities or schools.  What are the signs or symptoms? Symptoms typically develop 2-3 days after you come in contact with a cold virus. Most viral URIs last 7-10 days. However, viral URIs from the influenza virus (flu virus) can last 14-18 days and are typically more severe. Symptoms may include:  Runny or stuffy (congested) nose.  Sneezing.  Cough.  Sore throat.  Headache.  Fatigue.  Fever.  Loss of appetite.  Pain in your forehead, behind your eyes, and over your cheekbones (sinus pain).  Muscle aches.  How is this diagnosed? Your health care provider may diagnose a URI by:  Physical exam.  Tests to check that your  symptoms are not due to another condition such as: ? Strep throat. ? Sinusitis. ? Pneumonia. ? Asthma.  How is this treated? A URI goes away on its own with time. It cannot be cured with medicines, but medicines may be prescribed or recommended to relieve symptoms. Medicines may help:  Reduce your fever.  Reduce your cough.  Relieve nasal congestion.  Follow these instructions at home:  Take medicines only as directed by your health care provider.  Gargle warm saltwater or take cough drops to comfort your throat as directed by your health care provider.  Use a warm mist humidifier or inhale steam from a shower to increase air moisture. This may make it easier to breathe.  Drink enough fluid to keep your urine clear or pale yellow.  Eat soups and other clear broths and maintain good nutrition.  Rest as needed.  Return to work when your temperature has returned to normal or as your health care provider advises. You may need to stay home longer to avoid infecting others. You can also use a face mask and careful hand washing to prevent spread of the virus.  Increase the usage of your inhaler if you have asthma.  Do not use any tobacco products, including cigarettes, chewing tobacco, or electronic cigarettes. If you need help quitting, ask your health care provider. How is this prevented? The best way to protect yourself from getting a cold is to practice good hygiene.  Avoid oral or hand contact with people with cold symptoms.  Wash your   hands often if contact occurs.  There is no clear evidence that vitamin C, vitamin E, echinacea, or exercise reduces the chance of developing a cold. However, it is always recommended to get plenty of rest, exercise, and practice good nutrition. Contact a health care provider if:  You are getting worse rather than better.  Your symptoms are not controlled by medicine.  You have chills.  You have worsening shortness of breath.  You have  brown or red mucus.  You have yellow or brown nasal discharge.  You have pain in your face, especially when you bend forward.  You have a fever.  You have swollen neck glands.  You have pain while swallowing.  You have white areas in the back of your throat. Get help right away if:  You have severe or persistent: ? Headache. ? Ear pain. ? Sinus pain. ? Chest pain.  You have chronic lung disease and any of the following: ? Wheezing. ? Prolonged cough. ? Coughing up blood. ? A change in your usual mucus.  You have a stiff neck.  You have changes in your: ? Vision. ? Hearing. ? Thinking. ? Mood. This information is not intended to replace advice given to you by your health care provider. Make sure you discuss any questions you have with your health care provider. Document Released: 11/22/2000 Document Revised: 01/30/2016 Document Reviewed: 09/03/2013 Elsevier Interactive Patient Education  2017 Elsevier Inc.  

## 2016-08-10 NOTE — Progress Notes (Signed)
Patient: Athalee Garver Female    DOB: 06-24-1945   71 y.o.   MRN: [id number] Visit Date: 08/10/2016  Today's Provider: [name], [name]   Chief Complaint  Patient presents with  . URI   Subjective:    URI   This is a new problem. The current episode started in the past 7 days (Started with sorethroat Monday night). The problem has been gradually worsening. Maximum temperature: 99.8 temperature today. Associated symptoms include congestion, coughing, ear pain, a rash (from neck down to abdomen area), rhinorrhea, sneezing and a sore throat. Pertinent negatives include no abdominal pain, chest pain, diarrhea, headaches, sinus pain, vomiting or wheezing. She has tried increased fluids (Aleve) for the symptoms. The treatment provided no relief.      Allergies  Allergen Reactions  . Percocet [Oxycodone-Acetaminophen] Nausea And Vomiting  . Vicodin [Hydrocodone-Acetaminophen] Nausea And Vomiting     Current Outpatient Prescriptions:  .  ALPRAZolam (XANAX) 0.5 MG tablet, TAKE ONE-HALF TO ONE TABLET BY MOUTH EVERY DAY AS NEEDED, Disp: 30 tablet, Rfl: 5 .  aspirin 81 MG EC tablet, Take 81 mg by mouth daily. , Disp: , Rfl:  .  Calcium-Vitamin D (CALTRATE 600 PLUS-VIT D PO), Take 1 tablet by mouth daily. , Disp: , Rfl:  .  diltiazem (CARDIZEM) 30 MG tablet, Take 1 tablet (30 mg total) by mouth 3 (three) times daily as needed., Disp: 60 tablet, Rfl: 3 .  fluticasone (FLONASE) 50 MCG/ACT nasal spray, USE TWO PUFFS INTO EACH NOSTRIL DAILY, Disp: 16 g, Rfl: 5 .  Lactobacillus-Inulin ([place] PO), Take 1 tablet by mouth daily., Disp: , Rfl:  .  vitamin B-12 (CYANOCOBALAMIN) 1000 MCG tablet, Take 1 tablet (1,000 mcg total) by mouth daily., Disp: 30 tablet, Rfl: 0 .  Vitamin D, Cholecalciferol, 1000 UNITS TABS, Take 1 tablet by mouth 2 (two) times daily., Disp: , Rfl:  .  ezetimibe (ZETIA) 10 MG tablet, Take 1 tablet (10 mg total) by mouth daily. (Patient not  taking: Reported on 08/10/2016), Disp: 30 tablet, Rfl: 11 .  lovastatin (MEVACOR) 20 MG tablet, Take 1 tablet (20 mg total) by mouth at bedtime. (Patient not taking: Reported on 08/10/2016), Disp: 30 tablet, Rfl: 6  Review of Systems  Constitutional: Negative for chills.  HENT: Positive for congestion, ear pain, postnasal drip, rhinorrhea, sneezing, sore throat and voice change. Negative for sinus pain and trouble swallowing.   Respiratory: Positive for cough. Negative for chest tightness, shortness of breath and wheezing.   Cardiovascular: Negative for chest pain, palpitations and leg swelling.  Gastrointestinal: Negative for abdominal pain, diarrhea and vomiting.  Skin: Positive for rash (from neck down to abdomen area).  Neurological: Negative for dizziness and headaches.    Social History  Substance Use Topics  . Smoking status: Former Smoker    Packs/day: 0.25    Years: 20.00    Quit date: 01/17/2013  . Smokeless tobacco: Never Used  . Alcohol use 1.2 oz/week    2 Glasses of wine per week     Comment: ocassionally   Objective:   BP 128/80 (BP Location: Right Arm, Patient Position: Sitting, Cuff Size: Normal)   Pulse 99   Temp 98.5 F (36.9 C) (Oral)   Resp 16   Wt 134 lb 12.8 oz (61.1 kg)   SpO2 98%   BMI 23.88 kg/m  Vitals:   08/10/16 1007  BP: 128/80  Pulse: 99  Resp: 16  Temp: 98.5  F (36.9 C)  TempSrc: Oral  SpO2: 98%  Weight: 134 lb 12.8 oz (61.1 kg)     Physical Exam  Constitutional: She appears well-developed and well-nourished. No distress.  HENT:  Head: Normocephalic and atraumatic.  Right Ear: Hearing, tympanic membrane, external ear and ear canal normal.  Left Ear: Hearing, tympanic membrane, external ear and ear canal normal.  Nose: Nose normal.  Mouth/Throat: Uvula is midline, oropharynx is clear and moist and mucous membranes are normal. No oropharyngeal exudate.  Eyes: Conjunctivae are normal. Pupils are equal, round, and reactive to light. Right  eye exhibits no discharge. Left eye exhibits no discharge. No scleral icterus.  Neck: Normal range of motion. Neck supple. No tracheal deviation present. No thyromegaly present.  Cardiovascular: Normal rate, regular rhythm and normal heart sounds.  Exam reveals no gallop and no friction rub.   No murmur heard. Pulmonary/Chest: Effort normal and breath sounds normal. No stridor. No respiratory distress. She has no wheezes. She has no rales.  Lymphadenopathy:    She has no cervical adenopathy.  Skin: Skin is warm and dry. Rash noted. Rash is papular. She is not diaphoretic.     Vitals reviewed.      Assessment & Plan:     1. Viral URI with cough Treat symptomatically with choice OTC medications. Call if symptoms worsen or fail to improve over 10 day course. Push fluids.   2. Acute seasonal allergic rhinitis due to pollen Stable. Diagnosis pulled for medication refill. Continue current medical treatment plan. - fluticasone (FLONASE) 50 MCG/ACT nasal spray; USE TWO PUFFS INTO EACH NOSTRIL DAILY  Dispense: 16 g; Refill: 5  3. Recurrent UTI Patient keeps Cipro on hand for recurrent UTI.  - ciprofloxacin (CIPRO) 250 MG tablet; Take 1 tablet (250 mg total) by mouth 2 (two) times daily.  Dispense: 6 tablet; Refill: [place], [name]  [facility]

## 2016-08-28 DIAGNOSIS — H40003 Preglaucoma, unspecified, bilateral: Secondary | ICD-10-CM | POA: Diagnosis not present

## 2016-08-30 ENCOUNTER — Ambulatory Visit (INDEPENDENT_AMBULATORY_CARE_PROVIDER_SITE_OTHER): Payer: PPO | Admitting: Physician Assistant

## 2016-08-30 ENCOUNTER — Encounter: Payer: Self-pay | Admitting: Physician Assistant

## 2016-08-30 VITALS — BP 132/80 | HR 64 | Temp 97.5°F | Resp 16 | Ht 62.0 in | Wt 134.2 lb

## 2016-08-30 DIAGNOSIS — I10 Essential (primary) hypertension: Secondary | ICD-10-CM | POA: Diagnosis not present

## 2016-08-30 MED ORDER — DILTIAZEM HCL 30 MG PO TABS
30.0000 mg | ORAL_TABLET | Freq: Three times a day (TID) | ORAL | 3 refills | Status: DC | PRN
Start: 2016-08-30 — End: 2017-04-05

## 2016-08-30 NOTE — Patient Instructions (Signed)
DASH Eating Plan DASH stands for "Dietary Approaches to Stop Hypertension." The DASH eating plan is a healthy eating plan that has been shown to reduce high blood pressure (hypertension). It may also reduce your risk for type 2 diabetes, heart disease, and stroke. The DASH eating plan may also help with weight loss. What are tips for following this plan? General guidelines  Avoid eating more than 2,300 mg (milligrams) of salt (sodium) a day. If you have hypertension, you may need to reduce your sodium intake to 1,500 mg a day.  Limit alcohol intake to no more than 1 drink a day for nonpregnant women and 2 drinks a day for men. One drink equals 12 oz of beer, 5 oz of wine, or 1 oz of hard liquor.  Work with your health care provider to maintain a healthy body weight or to lose weight. Ask what an ideal weight is for you.  Get at least 30 minutes of exercise that causes your heart to beat faster (aerobic exercise) most days of the week. Activities may include walking, swimming, or biking.  Work with your health care provider or diet and nutrition specialist (dietitian) to adjust your eating plan to your individual calorie needs. Reading food labels  Check food labels for the amount of sodium per serving. Choose foods with less than 5 percent of the Daily Value of sodium. Generally, foods with less than 300 mg of sodium per serving fit into this eating plan.  To find whole grains, look for the word "whole" as the first word in the ingredient list. Shopping  Buy products labeled as "low-sodium" or "no salt added."  Buy fresh foods. Avoid canned foods and premade or frozen meals. Cooking  Avoid adding salt when cooking. Use salt-free seasonings or herbs instead of table salt or sea salt. Check with your health care provider or pharmacist before using salt substitutes.  Do not fry foods. Cook foods using healthy methods such as baking, boiling, grilling, and broiling instead.  Cook with  heart-healthy oils, such as olive, canola, soybean, or sunflower oil. Meal planning   Eat a balanced diet that includes: ? 5 or more servings of fruits and vegetables each day. At each meal, try to fill half of your plate with fruits and vegetables. ? Up to 6-8 servings of whole grains each day. ? Less than 6 oz of lean meat, poultry, or fish each day. A 3-oz serving of meat is about the same size as a deck of cards. One egg equals 1 oz. ? 2 servings of low-fat dairy each day. ? A serving of nuts, seeds, or beans 5 times each week. ? Heart-healthy fats. Healthy fats called Omega-3 fatty acids are found in foods such as flaxseeds and coldwater fish, like sardines, salmon, and mackerel.  Limit how much you eat of the following: ? Canned or prepackaged foods. ? Food that is high in trans fat, such as fried foods. ? Food that is high in saturated fat, such as fatty meat. ? Sweets, desserts, sugary drinks, and other foods with added sugar. ? Full-fat dairy products.  Do not salt foods before eating.  Try to eat at least 2 vegetarian meals each week.  Eat more home-cooked food and less restaurant, buffet, and fast food.  When eating at a restaurant, ask that your food be prepared with less salt or no salt, if possible. What foods are recommended? The items listed may not be a complete list. Talk with your dietitian about what   dietary choices are best for you. Grains Whole-grain or whole-wheat bread. Whole-grain or whole-wheat pasta. Brown rice. Oatmeal. Quinoa. Bulgur. Whole-grain and low-sodium cereals. Pita bread. Low-fat, low-sodium crackers. Whole-wheat flour tortillas. Vegetables Fresh or frozen vegetables (raw, steamed, roasted, or grilled). Low-sodium or reduced-sodium tomato and vegetable juice. Low-sodium or reduced-sodium tomato sauce and tomato paste. Low-sodium or reduced-sodium canned vegetables. Fruits All fresh, dried, or frozen fruit. Canned fruit in natural juice (without  added sugar). Meat and other protein foods Skinless chicken or turkey. Ground chicken or turkey. Pork with fat trimmed off. Fish and seafood. Egg whites. Dried beans, peas, or lentils. Unsalted nuts, nut butters, and seeds. Unsalted canned beans. Lean cuts of beef with fat trimmed off. Low-sodium, lean deli meat. Dairy Low-fat (1%) or fat-free (skim) milk. Fat-free, low-fat, or reduced-fat cheeses. Nonfat, low-sodium ricotta or cottage cheese. Low-fat or nonfat yogurt. Low-fat, low-sodium cheese. Fats and oils Soft margarine without trans fats. Vegetable oil. Low-fat, reduced-fat, or light mayonnaise and salad dressings (reduced-sodium). Canola, safflower, olive, soybean, and sunflower oils. Avocado. Seasoning and other foods Herbs. Spices. Seasoning mixes without salt. Unsalted popcorn and pretzels. Fat-free sweets. What foods are not recommended? The items listed may not be a complete list. Talk with your dietitian about what dietary choices are best for you. Grains Baked goods made with fat, such as croissants, muffins, or some breads. Dry pasta or rice meal packs. Vegetables Creamed or fried vegetables. Vegetables in a cheese sauce. Regular canned vegetables (not low-sodium or reduced-sodium). Regular canned tomato sauce and paste (not low-sodium or reduced-sodium). Regular tomato and vegetable juice (not low-sodium or reduced-sodium). Pickles. Olives. Fruits Canned fruit in a light or heavy syrup. Fried fruit. Fruit in cream or butter sauce. Meat and other protein foods Fatty cuts of meat. Ribs. Fried meat. Bacon. Sausage. Bologna and other processed lunch meats. Salami. Fatback. Hotdogs. Bratwurst. Salted nuts and seeds. Canned beans with added salt. Canned or smoked fish. Whole eggs or egg yolks. Chicken or turkey with skin. Dairy Whole or 2% milk, cream, and half-and-half. Whole or full-fat cream cheese. Whole-fat or sweetened yogurt. Full-fat cheese. Nondairy creamers. Whipped toppings.  Processed cheese and cheese spreads. Fats and oils Butter. Stick margarine. Lard. Shortening. Ghee. Bacon fat. Tropical oils, such as coconut, palm kernel, or palm oil. Seasoning and other foods Salted popcorn and pretzels. Onion salt, garlic salt, seasoned salt, table salt, and sea salt. Worcestershire sauce. Tartar sauce. Barbecue sauce. Teriyaki sauce. Soy sauce, including reduced-sodium. Steak sauce. Canned and packaged gravies. Fish sauce. Oyster sauce. Cocktail sauce. Horseradish that you find on the shelf. Ketchup. Mustard. Meat flavorings and tenderizers. Bouillon cubes. Hot sauce and Tabasco sauce. Premade or packaged marinades. Premade or packaged taco seasonings. Relishes. Regular salad dressings. Where to find more information:  National Heart, Lung, and Blood Institute: www.nhlbi.nih.gov  American Heart Association: www.heart.org Summary  The DASH eating plan is a healthy eating plan that has been shown to reduce high blood pressure (hypertension). It may also reduce your risk for type 2 diabetes, heart disease, and stroke.  With the DASH eating plan, you should limit salt (sodium) intake to 2,300 mg a day. If you have hypertension, you may need to reduce your sodium intake to 1,500 mg a day.  When on the DASH eating plan, aim to eat more fresh fruits and vegetables, whole grains, lean proteins, low-fat dairy, and heart-healthy fats.  Work with your health care provider or diet and nutrition specialist (dietitian) to adjust your eating plan to your individual   calorie needs. This information is not intended to replace advice given to you by your health care provider. Make sure you discuss any questions you have with your health care provider. Document Released: 05/18/2011 Document Revised: 05/22/2016 Document Reviewed: 05/22/2016 Elsevier Interactive Patient Education  2017 [employer] Inc.  

## 2016-08-30 NOTE — Progress Notes (Signed)
Patient: Isabel Gonzalez Female    DOB: 02-Dec-1945   71 y.o.   MRN: [id number] Visit Date: 08/30/2016  Today's Provider: [name], [name]   Chief Complaint  Patient presents with  . Hypertension   Subjective:    Patient here today C/O elevated blood pressure. Patient reports that on Monday BP was 140/78 at her eye doctor appointment. Patient reports that she has been checking her BP frequently. Patient reports that her BP was 169/83 last night. Patient reports she was feeling some "tingling" in her face and hands. Patient denies chest pain, shortness of breath, or swelling. Patient reports she exercises 4 times a week, patient is strict with salt intake. Patient reports she is drinking plenty of water. Patient reports that she will be going out of town for about 3 weeks.   Patient reports that she takes her Xanax 1/2 about 5-6 times a month. Patient reports she did take 1 whole tablet last night because she was anxious and having trouble sleeping. She is concerned because her mother had a stroke at 79. She has been seen by Dr. Rockey Situ (cardiology) in 06/2015.     Allergies  Allergen Reactions  . Percocet [Oxycodone-Acetaminophen] Nausea And Vomiting  . Vicodin [Hydrocodone-Acetaminophen] Nausea And Vomiting     Current Outpatient Prescriptions:  .  ALPRAZolam (XANAX) 0.5 MG tablet, TAKE ONE-HALF TO ONE TABLET BY MOUTH EVERY DAY AS NEEDED, Disp: 30 tablet, Rfl: 5 .  aspirin 81 MG EC tablet, Take 81 mg by mouth daily. , Disp: , Rfl:  .  Calcium-Vitamin D (CALTRATE 600 PLUS-VIT D PO), Take 1 tablet by mouth daily. , Disp: , Rfl:  .  fluticasone (FLONASE) 50 MCG/ACT nasal spray, USE TWO PUFFS INTO EACH NOSTRIL DAILY, Disp: 16 g, Rfl: 5 .  Lactobacillus-Inulin ([employer] PO), Take 1 tablet by mouth daily., Disp: , Rfl:  .  vitamin B-12 (CYANOCOBALAMIN) 1000 MCG tablet, Take 1 tablet (1,000 mcg total) by mouth daily., Disp: 30 tablet, Rfl: 0 .   diltiazem (CARDIZEM) 30 MG tablet, Take 1 tablet (30 mg total) by mouth 3 (three) times daily as needed. (Patient not taking: Reported on 08/30/2016), Disp: 60 tablet, Rfl: 3  Review of Systems  Constitutional: Positive for fatigue.  Respiratory: Negative.   Cardiovascular: Negative.   Gastrointestinal: Negative.   Neurological: Positive for numbness (not numbness but had tingling of face and bilateral hands last night). Negative for dizziness and headaches.  Psychiatric/Behavioral: Positive for sleep disturbance. The patient is nervous/anxious.     Social History  Substance Use Topics  . Smoking status: Former Smoker    Packs/day: 0.25    Years: 25.00    Quit date: 06/11/1990  . Smokeless tobacco: Never Used  . Alcohol use 3.0 oz/week    5 Glasses of wine per week     Comment: ocassionally   Objective:   BP 132/80 (BP Location: Left Arm, Patient Position: Sitting, Cuff Size: Normal)   Pulse 64   Temp 97.5 F (36.4 C) (Oral)   Resp 16   Ht 5\' 2"  (1.575 m)   Wt 134 lb 3.2 oz (60.9 kg)   BMI 24.55 kg/m  Vitals:   08/30/16 0911  BP: 132/80  Pulse: 64  Resp: 16  Temp: 97.5 F (36.4 C)  TempSrc: Oral  Weight: 134 lb 3.2 oz (60.9 kg)  Height: 5\' 2"  (1.575 m)    GAD 7 : Generalized Anxiety Score 08/30/2016  Nervous,  Anxious, on Edge 2  Control/stop worrying 0  Worry too much - different things 0  Trouble relaxing 0  Restless 0  Easily annoyed or irritable 0  Afraid - awful might happen 0  Total GAD 7 Score 2  Anxiety Difficulty Not difficult at all     Physical Exam  Constitutional: She appears well-developed and well-nourished. No distress.  Neck: Normal range of motion. Neck supple. No JVD present. No tracheal deviation present. No thyromegaly present.  Cardiovascular: Normal rate, regular rhythm and normal heart sounds.  Exam reveals no gallop and no friction rub.   No murmur heard. Pulmonary/Chest: Effort normal and breath sounds normal. No respiratory  distress. She has no wheezes. She has no rales.  Musculoskeletal: She exhibits no edema.  Lymphadenopathy:    She has no cervical adenopathy.  Skin: She is not diaphoretic.  Vitals reviewed.     Assessment & Plan:     1. Essential hypertension Bp ok today in the office. Will use diltiazem prn. She is to take her BP daily and if elevated over 140/90 she is to take the medication. She voices understanding. Being that sometimes her BP is low I do not want to start a daily medication and risks her having hypotensive spells as she has broken her humerus from a fall last year even. She is due for her AWV in 03/2017. She is to call if she has any issues prior to then. - diltiazem (CARDIZEM) 30 MG tablet; Take 1 tablet (30 mg total) by mouth 3 (three) times daily as needed.  Dispense: 60 tablet; Refill: [place], [name]  [facility]

## 2016-09-29 DIAGNOSIS — R030 Elevated blood-pressure reading, without diagnosis of hypertension: Secondary | ICD-10-CM | POA: Diagnosis not present

## 2016-09-29 DIAGNOSIS — J984 Other disorders of lung: Secondary | ICD-10-CM | POA: Diagnosis not present

## 2016-09-29 DIAGNOSIS — R079 Chest pain, unspecified: Secondary | ICD-10-CM | POA: Diagnosis not present

## 2016-09-29 DIAGNOSIS — R0789 Other chest pain: Secondary | ICD-10-CM | POA: Diagnosis not present

## 2016-09-29 DIAGNOSIS — R202 Paresthesia of skin: Secondary | ICD-10-CM | POA: Diagnosis not present

## 2016-09-29 DIAGNOSIS — R42 Dizziness and giddiness: Secondary | ICD-10-CM | POA: Diagnosis not present

## 2016-10-02 ENCOUNTER — Telehealth: Payer: Self-pay | Admitting: Cardiovascular Disease

## 2016-10-02 ENCOUNTER — Encounter: Payer: Self-pay | Admitting: Cardiovascular Disease

## 2016-10-02 ENCOUNTER — Ambulatory Visit (INDEPENDENT_AMBULATORY_CARE_PROVIDER_SITE_OTHER): Payer: PPO | Admitting: Cardiovascular Disease

## 2016-10-02 VITALS — BP 128/64 | HR 68 | Ht 63.0 in | Wt 133.5 lb

## 2016-10-02 DIAGNOSIS — F419 Anxiety disorder, unspecified: Secondary | ICD-10-CM

## 2016-10-02 DIAGNOSIS — R079 Chest pain, unspecified: Secondary | ICD-10-CM

## 2016-10-02 DIAGNOSIS — I700 Atherosclerosis of aorta: Secondary | ICD-10-CM

## 2016-10-02 DIAGNOSIS — E7800 Pure hypercholesterolemia, unspecified: Secondary | ICD-10-CM | POA: Diagnosis not present

## 2016-10-02 DIAGNOSIS — I493 Ventricular premature depolarization: Secondary | ICD-10-CM | POA: Diagnosis not present

## 2016-10-02 DIAGNOSIS — R002 Palpitations: Secondary | ICD-10-CM

## 2016-10-02 MED ORDER — EZETIMIBE 10 MG PO TABS: 10.0000 mg | ORAL_TABLET | Freq: Every day | ORAL | 11 refills | Status: DC

## 2016-10-02 NOTE — Telephone Encounter (Signed)
Can we make her an appt to come in for eval since she has new symptoms? Thank you!

## 2016-10-02 NOTE — Patient Instructions (Addendum)

## 2016-10-02 NOTE — Progress Notes (Signed)
Cardiology Office Note  Date:  10/02/2016   ID:  Elexa Kivi, DOB [birth year], MRN [id number]  PCP:  [name], [name]   Chief Complaint  Patient presents with  . other     Early f/u per pt call- elevated BP. Pt c/o chest tightness/pressure, tingling in face, hands, lips, dizziness. Reviewed meds with pt verbally.    HPI:  71 year old woman with past medical history of  hyperlipidemia,  Anxiety/panic attacks remote smoking who stopped 25 years ago,  tachypalpitations, PVCs mild aortic atherosclerosis seen on CT scan who presents for routine followup of her PAD,  who presents for routine follow-up of her tachycardia  She reports that she was recently at the beach Developed tachycardia, tingling in her face, dizziness, tingling in her hands [place] 546 systolic otherwise she reports that everything checked out okay She was sent home with structures to follow-up with our office Since discharge from the emergency room she has felt well with no further complaints Denies having any significant stress   She does report blood pressure was elevated at home prior to recent events  Wonders if her blood pressure cuff is inaccurate  Currently not taking any medication for cholesterol, difficulty with statins  EKG personally reviewed by myself on todays visit Shows normal sinus rhythm with rate 68 beats per minute, rare PVCs  Past medical history reviewed  previous  fall while she was running steps May 2016 Suffered a Left humerus fx 10/2014  Previously had trouble on Lipitor, Crestor, vytorin. Reports having muscle ache  Previous CT scan from 01/17/2013 reviewed with her in detail today, images pulled up in the office  showed mild to moderate descending aortic atherosclerotic plaque   CT scan of abdomen pelvis with contrast at [facility] also documented aortic atherosclerosis   PMH:    has a past medical history of Anemia; Anxiety; Arthritis; Cancer ([place]); Constipation; Dysrhythmia; Environmental allergies; GERD (gastroesophageal reflux disease); History of bladder infections; Hyperlipidemia; Murmur; and Urinary problem.  PSH:    Past Surgical History:  Procedure Laterality Date  . BREAST REDUCTION SURGERY    . FACIAL COSMETIC SURGERY    . ORIF HUMERUS FRACTURE Right 07/08/2015   Procedure: OPEN REDUCTION INTERNAL FIXATION (ORIF) HUMERAL SHAFT FRACTURE with bone graft and allograft;  Surgeon: Justice Britain, MD;  Location: [place];  Service: Orthopedics;  Laterality: Right;  . SKIN SURGERY     squamous cell    Current Outpatient Prescriptions  Medication Sig Dispense Refill  . ALPRAZolam (XANAX) 0.5 MG tablet TAKE ONE-HALF TO ONE TABLET BY MOUTH EVERY DAY AS NEEDED 30 tablet 5  . aspirin 81 MG EC tablet Take 81 mg by mouth daily.     . Calcium-Vitamin D (CALTRATE 600 PLUS-VIT D PO) Take 1 tablet by mouth daily.     . cetirizine (ZYRTEC ALLERGY) 10 MG tablet Take 10 mg by mouth daily.    Marland Kitchen diltiazem (CARDIZEM) 30 MG tablet Take 1 tablet (30 mg total) by mouth 3 (three) times daily as needed. (Patient taking differently: Take 30 mg by mouth at bedtime. ) 60 tablet 3  . fluticasone (FLONASE) 50 MCG/ACT nasal spray USE TWO PUFFS INTO EACH NOSTRIL DAILY (Patient taking differently: Place 1 spray into both nostrils as needed. USE TWO PUFFS INTO EACH NOSTRIL DAILY) 16 g 5  . Lactobacillus-Inulin (CULTURELLE DIGESTIVE HEALTH PO) Take 1 tablet by mouth daily.    Marland Kitchen  POTASSIUM PO Take by mouth daily.    . vitamin B-12 (CYANOCOBALAMIN) 1000 MCG tablet Take 1 tablet (1,000 mcg total) by mouth daily. 30 tablet 0  . vitamin C (ASCORBIC ACID) 500 MG tablet Take 500 mg by mouth daily.    Marland Kitchen ezetimibe (ZETIA) 10 MG tablet Take 1 tablet (10 mg total) by mouth daily. 30 tablet 11   No current facility-administered medications for this visit.      Allergies:   Percocet [oxycodone-acetaminophen]  and Vicodin [hydrocodone-acetaminophen]   Social History:  The patient  reports that she quit smoking about 26 years ago. She has a 6.25 pack-year smoking history. She has never used smokeless tobacco. She reports that she drinks about 3.0 oz of alcohol per week . She reports that she does not use drugs.   Family History:   family history includes Diabetes in her brother; Stroke (age of onset: 72) in her mother.    Review of Systems: Review of Systems  Constitutional: Negative.   Respiratory: Negative.   Cardiovascular: Positive for palpitations.  Gastrointestinal: Negative.   Musculoskeletal: Negative.   Neurological: Positive for dizziness.  Psychiatric/Behavioral: The patient is nervous/anxious.   All other systems reviewed and are negative.    PHYSICAL EXAM: VS:  BP 128/64 (BP Location: Left Arm, Patient Position: Sitting, Cuff Size: Normal)   Pulse 68   Ht 5\' 3"  (1.6 m)   Wt 133 lb 8 oz (60.6 kg)   BMI 23.65 kg/m  , BMI Body mass index is 23.65 kg/m. GEN: Well nourished, well developed, in no acute distress  HEENT: normal  Neck: no JVD, carotid bruits, or masses Cardiac: RRR; no murmurs, rubs, or gallops,no edema  Respiratory:  clear to auscultation bilaterally, normal work of breathing GI: soft, nontender, nondistended, + BS MS: no deformity or atrophy  Skin: warm and dry, no rash Neuro:  Strength and sensation are intact Psych: euthymic mood, full affect    Recent Labs: 03/27/2016: ALT 25; BUN 12; Creatinine, Ser 0.57; Platelets 297; Potassium 4.8; Sodium 140; TSH 1.510    Lipid Panel Lab Results  Component Value Date   CHOL 233 (H) 03/27/2016   HDL 63 03/27/2016   LDLCALC 145 (H) 03/27/2016   TRIG 124 03/27/2016      Wt Readings from Last 3 Encounters:  10/02/16 133 lb 8 oz (60.6 kg)  08/30/16 134 lb 3.2 oz (60.9 kg)  08/10/16 134 lb 12.8 oz (61.1 kg)       ASSESSMENT AND PLAN:  Hypercholesteremia - Plan: EKG 12-Lead Difficulty tolerating  statins in the past, Recommended she start Zetia daily  Chest pain, unspecified type - Plan: EKG 12-Lead No recent episodes of chest pain No further workup needed  Palpitations - Plan: EKG [facility] records have been requested Etiology of her recent symptoms of dizziness, tingling, palpitations is unclear Unable to exclude panic attack She does have Xanax but did not take it recently  Anxiety Suggested she take Xanax as needed  Aortic atherosclerosis Mild, seen on previous CT scan We have recommended she take Zetia  Frequent PVCs If she does have palpitations symptoms, recommended she take her diltiazem If symptoms get worse recommended she call our office, event monitor could be ordered and sent to her   Total encounter time more than 25 minutes  Greater than 50% was spent in counseling and coordination of care with the patient   Disposition:   F/U  12 months   Orders Placed This Encounter  Procedures  .  EKG 12-Lead     Signed, Esmond Plants, M.D., Ph.D. 10/02/2016  [place], [place]

## 2016-10-02 NOTE — Telephone Encounter (Signed)
Pt states she was at the beach last week, and had rapid HR, elevated BP 187/87, pt states the hospital there[facility] did a "PET" scan of her brain to r/o a stroke. States they did 2 EKGs, and blood work. They advised her to call her cardiologist. Pt states she is home now.  Please call.

## 2016-11-22 ENCOUNTER — Other Ambulatory Visit: Payer: Self-pay | Admitting: Physician Assistant

## 2016-11-22 DIAGNOSIS — F419 Anxiety disorder, unspecified: Secondary | ICD-10-CM

## 2016-11-22 NOTE — Telephone Encounter (Signed)
RX called in at [facility]  

## 2016-12-27 DIAGNOSIS — L821 Other seborrheic keratosis: Secondary | ICD-10-CM | POA: Diagnosis not present

## 2016-12-27 DIAGNOSIS — L570 Actinic keratosis: Secondary | ICD-10-CM | POA: Diagnosis not present

## 2016-12-27 DIAGNOSIS — D2261 Melanocytic nevi of right upper limb, including shoulder: Secondary | ICD-10-CM | POA: Diagnosis not present

## 2016-12-27 DIAGNOSIS — D225 Melanocytic nevi of trunk: Secondary | ICD-10-CM | POA: Diagnosis not present

## 2017-02-23 ENCOUNTER — Emergency Department: Payer: PPO

## 2017-02-23 ENCOUNTER — Inpatient Hospital Stay
Admission: EM | Admit: 2017-02-23 | Discharge: 2017-02-28 | DRG: 354 | Disposition: A | Discharge status: Home / Self Care | Payer: PPO | Attending: Surgery | Admitting: Surgery

## 2017-02-23 ENCOUNTER — Encounter: Payer: Self-pay | Admitting: Medical Oncology

## 2017-02-23 DIAGNOSIS — K5730 Diverticulosis of large intestine without perforation or abscess without bleeding: Secondary | ICD-10-CM | POA: Diagnosis present

## 2017-02-23 DIAGNOSIS — E538 Deficiency of other specified B group vitamins: Secondary | ICD-10-CM | POA: Diagnosis present

## 2017-02-23 DIAGNOSIS — H409 Unspecified glaucoma: Secondary | ICD-10-CM | POA: Diagnosis present

## 2017-02-23 DIAGNOSIS — K529 Noninfective gastroenteritis and colitis, unspecified: Secondary | ICD-10-CM | POA: Diagnosis present

## 2017-02-23 DIAGNOSIS — E785 Hyperlipidemia, unspecified: Secondary | ICD-10-CM | POA: Diagnosis present

## 2017-02-23 DIAGNOSIS — J309 Allergic rhinitis, unspecified: Secondary | ICD-10-CM | POA: Diagnosis present

## 2017-02-23 DIAGNOSIS — Z7982 Long term (current) use of aspirin: Secondary | ICD-10-CM

## 2017-02-23 DIAGNOSIS — K9189 Other postprocedural complications and disorders of digestive system: Secondary | ICD-10-CM | POA: Diagnosis not present

## 2017-02-23 DIAGNOSIS — K56609 Unspecified intestinal obstruction, unspecified as to partial versus complete obstruction: Secondary | ICD-10-CM

## 2017-02-23 DIAGNOSIS — K429 Umbilical hernia without obstruction or gangrene: Principal | ICD-10-CM | POA: Diagnosis present

## 2017-02-23 DIAGNOSIS — K469 Unspecified abdominal hernia without obstruction or gangrene: Secondary | ICD-10-CM | POA: Diagnosis not present

## 2017-02-23 DIAGNOSIS — K219 Gastro-esophageal reflux disease without esophagitis: Secondary | ICD-10-CM | POA: Diagnosis present

## 2017-02-23 DIAGNOSIS — R109 Unspecified abdominal pain: Secondary | ICD-10-CM | POA: Diagnosis present

## 2017-02-23 DIAGNOSIS — R197 Diarrhea, unspecified: Secondary | ICD-10-CM | POA: Diagnosis not present

## 2017-02-23 DIAGNOSIS — R1013 Epigastric pain: Secondary | ICD-10-CM

## 2017-02-23 DIAGNOSIS — Z79899 Other long term (current) drug therapy: Secondary | ICD-10-CM | POA: Diagnosis not present

## 2017-02-23 DIAGNOSIS — Z885 Allergy status to narcotic agent status: Secondary | ICD-10-CM | POA: Diagnosis not present

## 2017-02-23 DIAGNOSIS — F419 Anxiety disorder, unspecified: Secondary | ICD-10-CM | POA: Diagnosis present

## 2017-02-23 DIAGNOSIS — K56699 Other intestinal obstruction unspecified as to partial versus complete obstruction: Secondary | ICD-10-CM | POA: Diagnosis not present

## 2017-02-23 DIAGNOSIS — Z87891 Personal history of nicotine dependence: Secondary | ICD-10-CM | POA: Diagnosis not present

## 2017-02-23 DIAGNOSIS — Z85828 Personal history of other malignant neoplasm of skin: Secondary | ICD-10-CM

## 2017-02-23 DIAGNOSIS — F411 Generalized anxiety disorder: Secondary | ICD-10-CM | POA: Diagnosis present

## 2017-02-23 DIAGNOSIS — K567 Ileus, unspecified: Secondary | ICD-10-CM | POA: Diagnosis present

## 2017-02-23 LAB — COMPREHENSIVE METABOLIC PANEL
ALT: 38 U/L (ref 14–54)
AST: 35 U/L (ref 15–41)
Albumin: 4.3 g/dL (ref 3.5–5.0)
Alkaline Phosphatase: 69 U/L (ref 38–126)
Anion gap: 10 (ref 5–15)
BILIRUBIN TOTAL: 1 mg/dL (ref 0.3–1.2)
BUN: 19 mg/dL (ref 6–20)
CALCIUM: 9.7 mg/dL (ref 8.9–10.3)
CO2: 25 mmol/L (ref 22–32)
CREATININE: 0.59 mg/dL (ref 0.44–1.00)
Chloride: 101 mmol/L (ref 101–111)
Glucose, Bld: 130 mg/dL — ABNORMAL HIGH (ref 65–99)
Potassium: 3.8 mmol/L (ref 3.5–5.1)
Sodium: 136 mmol/L (ref 135–145)
TOTAL PROTEIN: 7.2 g/dL (ref 6.5–8.1)

## 2017-02-23 LAB — URINALYSIS, COMPLETE (UACMP) WITH MICROSCOPIC
BILIRUBIN URINE: NEGATIVE
GLUCOSE, UA: NEGATIVE mg/dL
Hgb urine dipstick: NEGATIVE
Ketones, ur: NEGATIVE mg/dL
LEUKOCYTES UA: NEGATIVE
NITRITE: NEGATIVE
PH: 6 (ref 5.0–8.0)
Protein, ur: NEGATIVE mg/dL
SPECIFIC GRAVITY, URINE: 1.015 (ref 1.005–1.030)

## 2017-02-23 LAB — CBC
HCT: 43 % (ref 35.0–47.0)
Hemoglobin: 15.1 g/dL (ref 12.0–16.0)
MCH: 33.1 pg (ref 26.0–34.0)
MCHC: 35 g/dL (ref 32.0–36.0)
MCV: 94.4 fL (ref 80.0–100.0)
PLATELETS: 287 10*3/uL (ref 150–440)
RBC: 4.56 MIL/uL (ref 3.80–5.20)
RDW: 13.1 % (ref 11.5–14.5)
WBC: 9.6 10*3/uL (ref 3.6–11.0)

## 2017-02-23 LAB — LIPASE, BLOOD: Lipase: 20 U/L (ref 11–51)

## 2017-02-23 MED ORDER — ONDANSETRON HCL 4 MG/2ML IJ SOLN
4.0000 mg | Freq: Four times a day (QID) | INTRAMUSCULAR | Status: DC | PRN
  Administered 2017-02-25 – 2017-02-27 (×5): 4 mg via INTRAVENOUS
  Filled 2017-02-23 (×7): qty 2

## 2017-02-23 MED ORDER — HYDRALAZINE HCL 20 MG/ML IJ SOLN: 10.0000 mg | INTRAMUSCULAR | Status: DC | PRN

## 2017-02-23 MED ORDER — DIPHENHYDRAMINE HCL 50 MG/ML IJ SOLN
12.5000 mg | Freq: Four times a day (QID) | INTRAMUSCULAR | Status: DC | PRN
  Administered 2017-02-24 – 2017-02-26 (×2): 12.5 mg via INTRAVENOUS
  Filled 2017-02-23 (×2): qty 1

## 2017-02-23 MED ORDER — LORAZEPAM 2 MG/ML IJ SOLN
1.0000 mg | INTRAMUSCULAR | Status: DC | PRN
  Administered 2017-02-27: 1 mg via INTRAVENOUS
  Filled 2017-02-23: qty 1

## 2017-02-23 MED ORDER — ONDANSETRON 4 MG PO TBDP: 4.0000 mg | ORAL_TABLET | Freq: Four times a day (QID) | ORAL | Status: DC | PRN

## 2017-02-23 MED ORDER — IOPAMIDOL (ISOVUE-300) INJECTION 61%
100.0000 mL | Freq: Once | INTRAVENOUS | Status: AC | PRN
  Administered 2017-02-23: 100 mL via INTRAVENOUS

## 2017-02-23 MED ORDER — DIPHENHYDRAMINE HCL 12.5 MG/5ML PO ELIX
12.5000 mg | ORAL_SOLUTION | Freq: Four times a day (QID) | ORAL | Status: DC | PRN
  Filled 2017-02-23: qty 5

## 2017-02-23 MED ORDER — LACTATED RINGERS IV SOLN
INTRAVENOUS | Status: DC
  Administered 2017-02-24 – 2017-02-25 (×6): via INTRAVENOUS

## 2017-02-23 MED ORDER — SODIUM CHLORIDE 0.9 % IV BOLUS (SEPSIS)
1000.0000 mL | Freq: Once | INTRAVENOUS | Status: AC
  Administered 2017-02-23: 1000 mL via INTRAVENOUS

## 2017-02-23 MED ORDER — METOPROLOL TARTRATE 5 MG/5ML IV SOLN: 5.0000 mg | Freq: Four times a day (QID) | INTRAVENOUS | Status: DC | PRN

## 2017-02-23 MED ORDER — SODIUM CHLORIDE 0.9 % IV SOLN
Freq: Once | INTRAVENOUS | Status: AC
  Administered 2017-02-23: 20:00:00 via INTRAVENOUS

## 2017-02-23 MED ORDER — FENTANYL CITRATE (PF) 100 MCG/2ML IJ SOLN: 50.0000 ug | INTRAMUSCULAR | Status: DC | PRN

## 2017-02-23 NOTE — ED Triage Notes (Signed)
Pt reports epigastric pain that began 1 week ago and has been intermittent until this am when it became constant and she began having diarrhea.

## 2017-02-23 NOTE — ED Notes (Signed)
Multiple family members at bedside

## 2017-02-23 NOTE — Progress Notes (Signed)
  Patient reexamined  Abdomen continues to be soft and benign  Again reiterated to the patient that should her pain worsen again that an operation would be indicated.  Will be following closely.  Clayburn Pert, MD [facility]  [facility] [phone] [facility] [phone]

## 2017-02-23 NOTE — ED Provider Notes (Signed)
[facility] Emergency Department Provider Note    First MD Initiated Contact with Patient 02/23/17 1447     (approximate)  I have reviewed the triage vital signs and the nursing notes.   HISTORY  Chief Complaint Abdominal Pain and Diarrhea    HPI Isabel Gonzalez is a 71 y.o. female presents with the chief complaint of intermittent epigastric pain that she's been dealing with since last week but it became more constant severe this morning. No vomiting but has had nausea. Had large multiple episodes of watery nonbloody diarrhea this morning. Has not tolerated anything by mouth since then. Is complaining of achy mid epigastric pain. No fevers.   Past Medical History:  Diagnosis Date  . Anemia   . Anxiety   . Arthritis   . Cancer (HCC)    squamous cell  . Constipation   . Dysrhythmia    palpitations   . Environmental allergies   . GERD (gastroesophageal reflux disease)   . History of bladder infections   . Hyperlipidemia   . Murmur   . Urinary problem    Family History  Problem Relation Age of Onset  . Stroke Mother 31  . Diabetes Brother    Past Surgical History:  Procedure Laterality Date  . BREAST REDUCTION SURGERY    . FACIAL COSMETIC SURGERY    . ORIF HUMERUS FRACTURE Right 07/08/2015   Procedure: OPEN REDUCTION INTERNAL FIXATION (ORIF) HUMERAL SHAFT FRACTURE with bone graft and allograft;  Surgeon: Justice Britain, MD;  Location: [place];  Service: Orthopedics;  Laterality: Right;  . SKIN SURGERY     squamous cell   Patient Active Problem List   Diagnosis Date Noted  . Small bowel obstruction ([place])   . 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 B12 deficiency 03/27/2016  . Humerus fracture 03/19/2015    Class: Status post  . Abdominal pain 03/18/2015  . Allergic rhinitis 03/18/2015  . Anxiety 03/18/2015  . Arthritis 03/18/2015  . Body mass index (BMI) of 23.0-23.9 in adult 03/18/2015  . Bursitis 03/18/2015  . Family history of diabetes  mellitus 03/18/2015  . Acid reflux 03/18/2015  . Glaucoma 03/18/2015  . Hypercholesteremia 03/18/2015  . Blood glucose elevated 03/18/2015  . Headache, migraine 03/18/2015  . Awareness of heartbeats 03/18/2015  . Chest pain 01/23/2012  . PALPITATIONS 08/23/2009      Prior to Admission medications   Medication Sig Start Date End Date Taking? Authorizing Provider  ALPRAZolam Duanne Moron) 0.5 MG tablet TAKE 1/2-1 TABLET BY MOUTH DAILY AS NEEDED 11/22/16  Yes [name], [name]  aspirin 81 MG EC tablet Take 81 mg by mouth daily.    Yes [provider]  Calcium-Vitamin D (CALTRATE 600 PLUS-VIT D PO) Take 1 tablet by mouth daily.    Yes [provider]  cetirizine (ZYRTEC ALLERGY) 10 MG tablet Take 10 mg by mouth daily.   Yes [provider]  diltiazem (CARDIZEM) 30 MG tablet Take 1 tablet (30 mg total) by mouth 3 (three) times daily as needed. Patient taking differently: Take 30 mg by mouth at bedtime.  08/30/16  Yes [name], [name]  ezetimibe (ZETIA) 10 MG tablet Take 1 tablet (10 mg total) by mouth daily. 10/02/16 10/02/17 Yes Gollan, Kathlene November, MD  fluticasone (FLONASE) 50 MCG/ACT nasal spray USE TWO PUFFS INTO EACH NOSTRIL DAILY Patient taking differently: Place 1 spray into both nostrils as needed. USE TWO PUFFS INTO EACH NOSTRIL DAILY 08/10/16  Yes [name],  [name]  Lactobacillus-Inulin (CULTURELLE DIGESTIVE HEALTH PO) Take 1 tablet by mouth daily.   Yes [provider]  POTASSIUM PO Take by mouth daily.   Yes [provider]  vitamin B-12 (CYANOCOBALAMIN) 1000 MCG tablet Take 1 tablet (1,000 mcg total) by mouth daily. 03/27/16  Yes Fenton Malling M, [name]  vitamin C (ASCORBIC ACID) 500 MG tablet Take 500 mg by mouth daily.   Yes [provider]    Allergies Percocet [oxycodone-acetaminophen] and Vicodin [hydrocodone-acetaminophen]    Social History Social History  Substance Use Topics  . Smoking status:  Former Smoker    Packs/day: 0.25    Years: 25.00    Quit date: 06/11/1990  . Smokeless tobacco: Never Used  . Alcohol use 3.0 oz/week    5 Glasses of wine per week     Comment: ocassionally    Review of Systems Patient denies headaches, rhinorrhea, blurry vision, numbness, shortness of breath, chest pain, edema, cough, abdominal pain, nausea, vomiting, diarrhea, dysuria, fevers, rashes or hallucinations unless otherwise stated above in HPI. ____________________________________________   PHYSICAL EXAM:  VITAL SIGNS: Vitals:   02/23/17 1713 02/23/17 1930  BP: (!) 152/73 (!) 165/99  Pulse: 75 72  Resp: 16 17  Temp:  97.6 F (36.4 C)  SpO2: 100% 99%    Constitutional: Alert and oriented. Well appearing and in no acute distress. Eyes: Conjunctivae are normal.  Head: Atraumatic. Nose: No congestion/rhinnorhea. Mouth/Throat: Mucous membranes are moist.   Neck: No stridor. Painless ROM.  Cardiovascular: Normal rate, regular rhythm. Grossly normal heart sounds.  Good peripheral circulation. Respiratory: Normal respiratory effort.  No retractions. Lungs CTAB. Gastrointestinal: Soft with + mid epigastric ttp. No distention. No abdominal bruits. No CVA tenderness. Musculoskeletal: No lower extremity tenderness nor edema.  No joint effusions. Neurologic:  Normal speech and language. No gross focal neurologic deficits are appreciated. No facial droop Skin:  Skin is warm, dry and intact. No rash noted. Psychiatric: Mood and affect are normal. Speech and behavior are normal.  ____________________________________________   LABS (all labs ordered are listed, but only abnormal results are displayed)  Results for orders placed or performed during the hospital encounter of 02/23/17 (from the past 24 hour(s))  Lipase, blood     Status: None   Collection Time: 02/23/17  1:58 PM  Result Value Ref Range   Lipase 20 11 - 51 U/L  Comprehensive metabolic panel     Status: Abnormal    Collection Time: 02/23/17  1:58 PM  Result Value Ref Range   Sodium 136 135 - 145 mmol/L   Potassium 3.8 3.5 - 5.1 mmol/L   Chloride 101 101 - 111 mmol/L   CO2 25 22 - 32 mmol/L   Glucose, Bld 130 (H) 65 - 99 mg/dL   BUN 19 6 - 20 mg/dL   Creatinine, Ser 0.59 0.44 - 1.00 mg/dL   Calcium 9.7 8.9 - 10.3 mg/dL   Total Protein 7.2 6.5 - 8.1 g/dL   Albumin 4.3 3.5 - 5.0 g/dL   AST 35 15 - 41 U/L   ALT 38 14 - 54 U/L   Alkaline Phosphatase 69 38 - 126 U/L   Total Bilirubin 1.0 0.3 - 1.2 mg/dL   GFR calc non Af Amer >60 >60 mL/min   GFR calc Af Amer >60 >60 mL/min   Anion gap 10 5 - 15  CBC     Status: None   Collection Time: 02/23/17  1:58 PM  Result Value Ref Range  WBC 9.6 3.6 - 11.0 K/uL   RBC 4.56 3.80 - 5.20 MIL/uL   Hemoglobin 15.1 12.0 - 16.0 g/dL   HCT 43.0 35.0 - 47.0 %   MCV 94.4 80.0 - 100.0 fL   MCH 33.1 26.0 - 34.0 pg   MCHC 35.0 32.0 - 36.0 g/dL   RDW 13.1 11.5 - 14.5 %   Platelets 287 150 - 440 K/uL  Urinalysis, Complete w Microscopic     Status: Abnormal   Collection Time: 02/23/17  1:58 PM  Result Value Ref Range   Color, Urine YELLOW (A) YELLOW   APPearance HAZY (A) CLEAR   Specific Gravity, Urine 1.015 1.005 - 1.030   pH 6.0 5.0 - 8.0   Glucose, UA NEGATIVE NEGATIVE mg/dL   Hgb urine dipstick NEGATIVE NEGATIVE   Bilirubin Urine NEGATIVE NEGATIVE   Ketones, ur NEGATIVE NEGATIVE mg/dL   Protein, ur NEGATIVE NEGATIVE mg/dL   Nitrite NEGATIVE NEGATIVE   Leukocytes, UA NEGATIVE NEGATIVE   RBC / HPF 0-5 0 - 5 RBC/hpf   WBC, UA 0-5 0 - 5 WBC/hpf   Bacteria, UA RARE (A) NONE SEEN   Squamous Epithelial / LPF 0-5 (A) NONE SEEN   Mucus PRESENT    ____________________________________________  EKG My review and personal interpretation at Time: 19:20   Indication: epigastric pain  Rate: 60  Rhythm: sinus Axis: normal  Other: normal intervals, no stemi, no depressions ____________________________________________  RADIOLOGY  I personally reviewed all  radiographic images ordered to evaluate for the above acute complaints and reviewed radiology reports and findings.  These findings were personally discussed with the patient.  Please see medical record for radiology report.  ____________________________________________   PROCEDURES  Procedure(s) performed:  Procedures    Critical Care performed: no ____________________________________________   INITIAL IMPRESSION / ASSESSMENT AND PLAN / ED COURSE  Pertinent labs & imaging results that were available during my care of the patient were reviewed by me and considered in my medical decision making (see chart for details).  DDX: sbo, appy, cholelithiasis, hernia, acs, pnea  Isabel Gonzalez is a 71 y.o. who presents to the ED with abdominal and epigastric pain as described above. Blood work is reassuring the patient's afebrile however she does have some tenderness on exam therefore CT imaging ordered for the above differential.  ET imaging shows evidence of closed loop small bowel obstruction which would 20 patient's symptoms. Patient has been nothing by mouth since last night. We'll continue with IV fluids and pain management. Consulted general surgery who agrees to admit patient for further evaluation and management.  Have discussed with the patient and available family all diagnostics and treatments performed thus far and all questions were answered to the best of my ability. The patient demonstrates understanding and agreement with plan.      ____________________________________________   FINAL CLINICAL IMPRESSION(S) / ED DIAGNOSES  Final diagnoses:  Small bowel obstruction (HCC)  Epigastric abdominal pain      NEW MEDICATIONS STARTED DURING THIS VISIT:  New Prescriptions   No medications on file     Note:  This document was prepared using Dragon voice recognition software and may include unintentional dictation errors.    Merlyn Lot, MD 02/23/17  [phone]

## 2017-02-23 NOTE — H&P (Addendum)
Patient ID: Isabel Gonzalez, female   DOB: 1946/04/29, 71 y.o.   MRN: [id number]  HPI Isabel Gonzalez is a 71 y.o. female 's in consultation by Dr. Quentin Cornwall. She reports started having abdominal pain today. The pain is diffuse intermittent and severe. She reports as a colicky type. No previous episodes like this. No previous abdominal operations. She did have some nausea but no emesis. She also had an episode of diarrhea earlier. She is able to perform more than 4 Mets of activity without any shortness of breath or chest pain. Labs were reviewed normal creatinine normal white count. CT scan was personally reviewed and there is evidence of proximal decompressed bowel and distal decompressed bowel with findings concerning for a closed loop obstruction possible internal hernia. There is no free air or pneumatosis. There is some swirling pattern of the mesenteric vessels as well  HPI  Past Medical History:  Diagnosis Date  . Anemia   . Anxiety   . Arthritis   . Cancer (HCC)    squamous cell  . Constipation   . Dysrhythmia    palpitations   . Environmental allergies   . GERD (gastroesophageal reflux disease)   . History of bladder infections   . Hyperlipidemia   . Murmur   . Urinary problem     Past Surgical History:  Procedure Laterality Date  . BREAST REDUCTION SURGERY    . FACIAL COSMETIC SURGERY    . ORIF HUMERUS FRACTURE Right 07/08/2015   Procedure: OPEN REDUCTION INTERNAL FIXATION (ORIF) HUMERAL SHAFT FRACTURE with bone graft and allograft;  Surgeon: Justice Britain, MD;  Location: [place];  Service: Orthopedics;  Laterality: Right;  . SKIN SURGERY     squamous cell    Family History  Problem Relation Age of Onset  . Stroke Mother 66  . Diabetes Brother     Social History Social History  Substance Use Topics  . Smoking status: Former Smoker    Packs/day: 0.25    Years: 25.00    Quit date: 06/11/1990  . Smokeless tobacco: Never Used  . Alcohol use 3.0 oz/week     5 Glasses of wine per week     Comment: ocassionally    Allergies  Allergen Reactions  . Percocet [Oxycodone-Acetaminophen] Nausea And Vomiting  . Vicodin [Hydrocodone-Acetaminophen] Nausea And Vomiting    Current Facility-Administered Medications  Medication Dose Route Frequency Provider Last Rate Last Dose  . 0.9 %  sodium chloride infusion   Intravenous Once Merlyn Lot, MD       Current Outpatient Prescriptions  Medication Sig Dispense Refill  . ALPRAZolam (XANAX) 0.5 MG tablet TAKE 1/2-1 TABLET BY MOUTH DAILY AS NEEDED 30 tablet 5  . aspirin 81 MG EC tablet Take 81 mg by mouth daily.     . Calcium-Vitamin D (CALTRATE 600 PLUS-VIT D PO) Take 1 tablet by mouth daily.     . cetirizine (ZYRTEC ALLERGY) 10 MG tablet Take 10 mg by mouth daily.    Marland Kitchen diltiazem (CARDIZEM) 30 MG tablet Take 1 tablet (30 mg total) by mouth 3 (three) times daily as needed. (Patient taking differently: Take 30 mg by mouth at bedtime. ) 60 tablet 3  . ezetimibe (ZETIA) 10 MG tablet Take 1 tablet (10 mg total) by mouth daily. 30 tablet 11  . fluticasone (FLONASE) 50 MCG/ACT nasal spray USE TWO PUFFS INTO EACH NOSTRIL DAILY (Patient taking differently: Place 1 spray into both nostrils as needed. USE TWO PUFFS INTO EACH NOSTRIL  DAILY) 16 g 5  . Lactobacillus-Inulin (CULTURELLE DIGESTIVE HEALTH PO) Take 1 tablet by mouth daily.    Marland Kitchen POTASSIUM PO Take by mouth daily.    . vitamin B-12 (CYANOCOBALAMIN) 1000 MCG tablet Take 1 tablet (1,000 mcg total) by mouth daily. 30 tablet 0  . vitamin C (ASCORBIC ACID) 500 MG tablet Take 500 mg by mouth daily.       Review of Systems Full ROS  was asked and was negative except for the information on the HPI  Physical Exam Blood pressure (!) 152/73, pulse 75, temperature (!) 97.5 F (36.4 C), temperature source Oral, resp. rate 16, weight 60.3 kg (133 lb), SpO2 100 %. CONSTITUTIONAL: NAD EYES: Pupils are equal, round, and reactive to light, Sclera are  non-icteric. EARS, NOSE, MOUTH AND THROAT: The oropharynx is clear. The oral mucosa is pink and moist. Hearing is intact to voice. LYMPH NODES:  Lymph nodes in the neck are normal. RESPIRATORY:  Lungs are clear. There is normal respiratory effort, with equal breath sounds bilaterally, and without pathologic use of accessory muscles. CARDIOVASCULAR: Heart is regular without murmurs, gallops, or rubs. GI: The abdomen is soft, mildly tender to palpation, no rebound  , no overt peritonitis. GU: Rectal deferred.   MUSCULOSKELETAL: Normal muscle strength and tone. No cyanosis or edema.   SKIN: Turgor is good and there are no pathologic skin lesions or ulcers. NEUROLOGIC: Motor and sensation is grossly normal. Cranial nerves are grossly intact. PSYCH:  Oriented to person, place and time. Affect is normal.  Data Reviewed  I have personally reviewed the patient's imaging, laboratory findings and medical records.    Assessment/ Plan 71 year old with CT finding concerning for closed loop obstruction. Although her clinical exam is not peritoneally Think that given the CT findings and she will need operative management. I discussed with her at length about the options of at least doing a diagnostic laparoscopy with a high chance of exploratory laparotomy and bowel resection. Did explain to her about the risks and benefits and possible complications including but not limited to: Bleeding, infection, re-interventions, hernias and bowel injuries. She was very emotional at this time and did not 1 make a final determination. She will talk to the son and some of family members before making a final decision. She wanted me to have a second opinion and I will asked Dr. Adonis Huguenin was coming at 7 PM to reevaluate her. I have not posterior yet since she has not made  Up her mind. I will also obtain a stat EKG and asked the ER to place an NG tube and start a fluid bolus Extensive counseling provided.  Caroleen Hamman, MD  [facility] General Surgeon 02/23/2017, 6:48 PM

## 2017-02-23 NOTE — Progress Notes (Signed)
  Visit with patient and exam performed.  Abdomen currently completely benign and nontender  Discussed CT results at length with patient and family. CT very concerning for closed loop bowel obstruction however atypical in a patient with a virgin abdomen.  Given the complete resolution of her pain currently I discussed with the patient at the minimum she needs to come in the hospital overnight for repeat exams, labs, imaging in the morning. However, should her pain return in any shape or form then an operation would be indicated. Should her pain stay resolved but her images in the morning and not be much improved than an operation would be indicated. However, should her pain stay resolved, her images be improved in the morning, and her labs remained normal then she may avoid an operation.  Patient and her family voiced understanding. Plan to bring the patient into the hospital for observation, serial abdominal exams, serial labs, repeat images in the morning. Discussed with the patient it is still likely that she will require an operation. She voiced understanding and agreed to this plan. Please see H&P by Dr. Dahlia Byes for the remainder of the details.  Clayburn Pert, MD [facility]  [facility] [phone] [facility] [phone]

## 2017-02-23 NOTE — ED Notes (Signed)
Patient states abdominal pain intermittently increases to an 8/10, but then resolves to 4/10 which is constant.

## 2017-02-23 NOTE — ED Notes (Signed)
Transportation notes were made in error by Network engineer.

## 2017-02-23 NOTE — ED Notes (Signed)
Patient was tearful while the surgeon was explaining the surgery and asked to call her children before consenting.,

## 2017-02-24 ENCOUNTER — Observation Stay: Payer: PPO | Admitting: Anesthesiology

## 2017-02-24 ENCOUNTER — Observation Stay: Payer: PPO

## 2017-02-24 ENCOUNTER — Encounter
Admission: EM | Disposition: A | Discharge status: Home / Self Care | Payer: Self-pay | Source: Home / Self Care | Attending: General Surgery

## 2017-02-24 ENCOUNTER — Encounter: Payer: Self-pay | Admitting: Anesthesiology

## 2017-02-24 DIAGNOSIS — K56609 Unspecified intestinal obstruction, unspecified as to partial versus complete obstruction: Secondary | ICD-10-CM | POA: Diagnosis not present

## 2017-02-24 DIAGNOSIS — R109 Unspecified abdominal pain: Secondary | ICD-10-CM | POA: Diagnosis not present

## 2017-02-24 HISTORY — PX: LAPAROTOMY: SHX154

## 2017-02-24 LAB — COMPREHENSIVE METABOLIC PANEL
ALK PHOS: 63 U/L (ref 38–126)
ALT: 29 U/L (ref 14–54)
ANION GAP: 4 — AB (ref 5–15)
AST: 28 U/L (ref 15–41)
Albumin: 3.5 g/dL (ref 3.5–5.0)
BILIRUBIN TOTAL: 1.4 mg/dL — AB (ref 0.3–1.2)
BUN: 10 mg/dL (ref 6–20)
CALCIUM: 8.9 mg/dL (ref 8.9–10.3)
CO2: 24 mmol/L (ref 22–32)
CREATININE: 0.39 mg/dL — AB (ref 0.44–1.00)
Chloride: 112 mmol/L — ABNORMAL HIGH (ref 101–111)
Glucose, Bld: 109 mg/dL — ABNORMAL HIGH (ref 65–99)
Potassium: 3.8 mmol/L (ref 3.5–5.1)
SODIUM: 140 mmol/L (ref 135–145)
Total Protein: 6 g/dL — ABNORMAL LOW (ref 6.5–8.1)

## 2017-02-24 LAB — CBC
HEMATOCRIT: 39.7 % (ref 35.0–47.0)
Hemoglobin: 13.7 g/dL (ref 12.0–16.0)
MCH: 33 pg (ref 26.0–34.0)
MCHC: 34.5 g/dL (ref 32.0–36.0)
MCV: 95.7 fL (ref 80.0–100.0)
Platelets: 240 10*3/uL (ref 150–440)
RBC: 4.15 MIL/uL (ref 3.80–5.20)
RDW: 13.3 % (ref 11.5–14.5)
WBC: 8.6 10*3/uL (ref 3.6–11.0)

## 2017-02-24 LAB — SURGICAL PCR SCREEN
MRSA, PCR: NEGATIVE
Staphylococcus aureus: NEGATIVE

## 2017-02-24 SURGERY — LAPAROTOMY, EXPLORATORY
Anesthesia: General

## 2017-02-24 MED ORDER — FENTANYL CITRATE (PF) 100 MCG/2ML IJ SOLN
INTRAMUSCULAR | Status: AC
  Filled 2017-02-24: qty 2

## 2017-02-24 MED ORDER — ACETAMINOPHEN 10 MG/ML IV SOLN
INTRAVENOUS | Status: AC
Start: 2017-02-24 — End: 2017-02-24
  Filled 2017-02-24: qty 100

## 2017-02-24 MED ORDER — SUGAMMADEX SODIUM 200 MG/2ML IV SOLN
INTRAVENOUS | Status: DC | PRN
  Administered 2017-02-24: 150 mg via INTRAVENOUS

## 2017-02-24 MED ORDER — KETOROLAC TROMETHAMINE 30 MG/ML IJ SOLN
INTRAMUSCULAR | Status: DC | PRN
  Administered 2017-02-24: 30 mg via INTRAVENOUS

## 2017-02-24 MED ORDER — ROCURONIUM BROMIDE 50 MG/5ML IV SOLN
INTRAVENOUS | Status: AC
  Filled 2017-02-24: qty 0

## 2017-02-24 MED ORDER — ACETAMINOPHEN 10 MG/ML IV SOLN
1000.0000 mg | Freq: Four times a day (QID) | INTRAVENOUS | Status: DC
  Administered 2017-02-24 – 2017-02-25 (×3): 1000 mg via INTRAVENOUS
  Filled 2017-02-24 (×4): qty 100

## 2017-02-24 MED ORDER — LIDOCAINE HCL (PF) 2 % IJ SOLN
INTRAMUSCULAR | Status: AC
  Filled 2017-02-24: qty 0

## 2017-02-24 MED ORDER — BUPIVACAINE-EPINEPHRINE (PF) 0.25% -1:200000 IJ SOLN
INTRAMUSCULAR | Status: AC
  Filled 2017-02-24: qty 30

## 2017-02-24 MED ORDER — DEXAMETHASONE SODIUM PHOSPHATE 10 MG/ML IJ SOLN
INTRAMUSCULAR | Status: DC | PRN
  Administered 2017-02-24: 10 mg via INTRAVENOUS

## 2017-02-24 MED ORDER — ACETAMINOPHEN 10 MG/ML IV SOLN
INTRAVENOUS | Status: DC | PRN
  Administered 2017-02-24: 1000 mg via INTRAVENOUS

## 2017-02-24 MED ORDER — KETOROLAC TROMETHAMINE 30 MG/ML IJ SOLN: 15.0000 mg | Freq: Four times a day (QID) | INTRAMUSCULAR | Status: DC

## 2017-02-24 MED ORDER — ONDANSETRON HCL 4 MG/2ML IJ SOLN
INTRAMUSCULAR | Status: AC
  Filled 2017-02-24: qty 0

## 2017-02-24 MED ORDER — BUPIVACAINE LIPOSOME 1.3 % IJ SUSP
INTRAMUSCULAR | Status: AC
  Filled 2017-02-24: qty 20

## 2017-02-24 MED ORDER — SUCCINYLCHOLINE CHLORIDE 20 MG/ML IJ SOLN
INTRAMUSCULAR | Status: AC
  Filled 2017-02-24: qty 0

## 2017-02-24 MED ORDER — DEXTROSE 5 % IV SOLN
2.0000 g | Freq: Once | INTRAVENOUS | Status: AC
  Administered 2017-02-24: 2 g via INTRAVENOUS
  Filled 2017-02-24: qty 2

## 2017-02-24 MED ORDER — FENTANYL CITRATE (PF) 100 MCG/2ML IJ SOLN
25.0000 ug | INTRAMUSCULAR | Status: DC | PRN
  Administered 2017-02-24 (×2): 50 ug via INTRAVENOUS

## 2017-02-24 MED ORDER — ONDANSETRON HCL 4 MG/2ML IJ SOLN: 4.0000 mg | Freq: Once | INTRAMUSCULAR | Status: DC | PRN

## 2017-02-24 MED ORDER — DEXAMETHASONE SODIUM PHOSPHATE 10 MG/ML IJ SOLN
INTRAMUSCULAR | Status: AC
  Filled 2017-02-24: qty 0

## 2017-02-24 MED ORDER — MORPHINE SULFATE (PF) 2 MG/ML IV SOLN
2.0000 mg | Freq: Four times a day (QID) | INTRAVENOUS | Status: DC | PRN
  Administered 2017-02-24 (×2): 2 mg via INTRAVENOUS
  Filled 2017-02-24 (×2): qty 1

## 2017-02-24 MED ORDER — PROPOFOL 10 MG/ML IV BOLUS
INTRAVENOUS | Status: AC
  Filled 2017-02-24: qty 20

## 2017-02-24 MED ORDER — KETOROLAC TROMETHAMINE 30 MG/ML IJ SOLN
15.0000 mg | Freq: Four times a day (QID) | INTRAMUSCULAR | Status: DC
  Administered 2017-02-24 – 2017-02-28 (×15): 15 mg via INTRAVENOUS
  Filled 2017-02-24 (×15): qty 1

## 2017-02-24 MED ORDER — KETOROLAC TROMETHAMINE 30 MG/ML IJ SOLN
INTRAMUSCULAR | Status: AC
  Filled 2017-02-24: qty 1

## 2017-02-24 MED ORDER — MIDAZOLAM HCL 2 MG/2ML IJ SOLN
INTRAMUSCULAR | Status: AC
  Filled 2017-02-24: qty 2

## 2017-02-24 MED ORDER — KETOROLAC TROMETHAMINE 30 MG/ML IJ SOLN: 30.0000 mg | Freq: Four times a day (QID) | INTRAMUSCULAR | Status: DC

## 2017-02-24 MED ORDER — CIPROFLOXACIN IN D5W 400 MG/200ML IV SOLN
400.0000 mg | Freq: Two times a day (BID) | INTRAVENOUS | Status: DC
  Administered 2017-02-24 – 2017-02-28 (×8): 400 mg via INTRAVENOUS
  Filled 2017-02-24 (×10): qty 200

## 2017-02-24 MED ORDER — BUPIVACAINE-EPINEPHRINE (PF) 0.25% -1:200000 IJ SOLN
INTRAMUSCULAR | Status: DC | PRN
  Administered 2017-02-24: 30 mL

## 2017-02-24 MED ORDER — SODIUM CHLORIDE 0.9 % IJ SOLN
INTRAMUSCULAR | Status: AC
  Filled 2017-02-24: qty 50

## 2017-02-24 MED ORDER — LIDOCAINE HCL (CARDIAC) 20 MG/ML IV SOLN
INTRAVENOUS | Status: DC | PRN
  Administered 2017-02-24: 50 mg via INTRAVENOUS

## 2017-02-24 MED ORDER — BUPIVACAINE LIPOSOME 1.3 % IJ SUSP
INTRAMUSCULAR | Status: DC | PRN
  Administered 2017-02-24: 70 mL

## 2017-02-24 MED ORDER — PROPOFOL 10 MG/ML IV BOLUS
INTRAVENOUS | Status: DC | PRN
  Administered 2017-02-24: 100 mg via INTRAVENOUS

## 2017-02-24 MED ORDER — ROCURONIUM BROMIDE 100 MG/10ML IV SOLN
INTRAVENOUS | Status: DC | PRN
  Administered 2017-02-24: 30 mg via INTRAVENOUS
  Administered 2017-02-24: 20 mg via INTRAVENOUS

## 2017-02-24 MED ORDER — MIDAZOLAM HCL 2 MG/2ML IJ SOLN
INTRAMUSCULAR | Status: DC | PRN
  Administered 2017-02-24: 2 mg via INTRAVENOUS

## 2017-02-24 MED ORDER — FENTANYL CITRATE (PF) 100 MCG/2ML IJ SOLN
INTRAMUSCULAR | Status: DC | PRN
  Administered 2017-02-24 (×2): 50 ug via INTRAVENOUS
  Administered 2017-02-24: 100 ug via INTRAVENOUS

## 2017-02-24 MED ORDER — SODIUM CHLORIDE 0.9 % IV SOLN: Freq: Once | INTRAVENOUS | Status: DC

## 2017-02-24 MED ORDER — SUGAMMADEX SODIUM 200 MG/2ML IV SOLN
INTRAVENOUS | Status: AC
  Filled 2017-02-24: qty 2

## 2017-02-24 MED ORDER — METRONIDAZOLE IN NACL 5-0.79 MG/ML-% IV SOLN
500.0000 mg | Freq: Three times a day (TID) | INTRAVENOUS | Status: DC
  Administered 2017-02-24 – 2017-02-28 (×12): 500 mg via INTRAVENOUS
  Filled 2017-02-24 (×14): qty 100

## 2017-02-24 SURGICAL SUPPLY — 70 items
APPLIER CLIP 11 MED OPEN (CLIP)
APPLIER CLIP 13 LRG OPEN (CLIP)
BLADE CLIPPER SURG (BLADE) IMPLANT
BLADE SURG 15 STRL LF DISP TIS (BLADE) ×1 IMPLANT
BLADE SURG 15 STRL SS (BLADE) ×1
CANISTER SUCT 1200ML W/VALVE (MISCELLANEOUS) IMPLANT
CANISTER SUCT 3000ML PPV (MISCELLANEOUS) ×2 IMPLANT
CHLORAPREP W/TINT 26ML (MISCELLANEOUS) ×2 IMPLANT
CLEANER CAUTERY TIP 5X5 PAD (MISCELLANEOUS) ×1 IMPLANT
CLIP APPLIE 11 MED OPEN (CLIP) IMPLANT
CLIP APPLIE 13 LRG OPEN (CLIP) IMPLANT
CNTNR SPEC 2.5X3XGRAD LEK (MISCELLANEOUS)
CONT SPEC 4OZ STER OR WHT (MISCELLANEOUS)
CONTAINER SPEC 2.5X3XGRAD LEK (MISCELLANEOUS) IMPLANT
DERMABOND ADVANCED (GAUZE/BANDAGES/DRESSINGS) ×2
DERMABOND ADVANCED .7 DNX12 (GAUZE/BANDAGES/DRESSINGS) ×2 IMPLANT
DEVICE TROCAR PUNCTURE CLOSURE (ENDOMECHANICALS) IMPLANT
DRAPE LAPAROTOMY 100X77 ABD (DRAPES) ×2 IMPLANT
DRAPE TABLE BACK 80X90 (DRAPES) ×2 IMPLANT
DRSG TEGADERM 2-3/8X2-3/4 SM (GAUZE/BANDAGES/DRESSINGS) IMPLANT
DRSG TELFA 3X8 NADH (GAUZE/BANDAGES/DRESSINGS) IMPLANT
DRSG TELFA 4X3 1S NADH ST (GAUZE/BANDAGES/DRESSINGS) IMPLANT
ELECT BLADE 6.5 EXT (BLADE) ×2 IMPLANT
ELECT REM PT RETURN 9FT ADLT (ELECTROSURGICAL) ×2
ELECTRODE REM PT RTRN 9FT ADLT (ELECTROSURGICAL) ×1 IMPLANT
GAUZE SPONGE 4X4 12PLY STRL (GAUZE/BANDAGES/DRESSINGS) IMPLANT
GLOVE BIO SURGEON STRL SZ7 (GLOVE) ×10 IMPLANT
GOWN STRL REUS W/ TWL LRG LVL3 (GOWN DISPOSABLE) ×2 IMPLANT
GOWN STRL REUS W/TWL LRG LVL3 (GOWN DISPOSABLE) ×2
HANDLE SUCTION POOLE (INSTRUMENTS) ×1 IMPLANT
HANDLE YANKAUER SUCT BULB TIP (MISCELLANEOUS) IMPLANT
IRRIGATION STRYKERFLOW (MISCELLANEOUS) IMPLANT
IRRIGATOR STRYKERFLOW (MISCELLANEOUS)
IV NS 1000ML (IV SOLUTION)
IV NS 1000ML BAXH (IV SOLUTION) IMPLANT
L-HOOK LAP DISP 36CM (ELECTROSURGICAL)
LHOOK LAP DISP 36CM (ELECTROSURGICAL) IMPLANT
LIGASURE IMPACT 36 18CM CVD LR (INSTRUMENTS) IMPLANT
NEEDLE HYPO 22GX1.5 SAFETY (NEEDLE) ×2 IMPLANT
NEEDLE HYPO 25X1 1.5 SAFETY (NEEDLE) IMPLANT
PACK BASIN MAJOR ARMC (MISCELLANEOUS) ×2 IMPLANT
PACK LAP CHOLECYSTECTOMY (MISCELLANEOUS) IMPLANT
PAD CLEANER CAUTERY TIP 5X5 (MISCELLANEOUS) ×1
PENCIL ELECTRO HAND CTR (MISCELLANEOUS) ×2 IMPLANT
RELOAD PROXIMATE 75MM BLUE (ENDOMECHANICALS) IMPLANT
SCISSORS METZENBAUM CVD 33 (INSTRUMENTS) IMPLANT
SLEEVE ADV FIXATION 5X100MM (TROCAR) IMPLANT
SPONGE LAP 18X18 5 PK (GAUZE/BANDAGES/DRESSINGS) ×2 IMPLANT
SPONGE LAP 18X36 2PK (MISCELLANEOUS) IMPLANT
STAPLER PROXIMATE 75MM BLUE (STAPLE) IMPLANT
STAPLER SKIN PROX 35W (STAPLE) IMPLANT
SUCTION POOLE HANDLE (INSTRUMENTS) ×2
SUT MNCRL AB 4-0 PS2 18 (SUTURE) IMPLANT
SUT PDS AB 1 TP1 96 (SUTURE) IMPLANT
SUT SILK 2 0 (SUTURE) ×1
SUT SILK 2 0 SH CR/8 (SUTURE) ×2 IMPLANT
SUT SILK 2 0SH CR/8 30 (SUTURE) ×2 IMPLANT
SUT SILK 2-0 18XBRD TIE 12 (SUTURE) ×1 IMPLANT
SUT VIC AB 0 CT1 36 (SUTURE) IMPLANT
SUT VIC AB 2-0 SH 27 (SUTURE)
SUT VIC AB 2-0 SH 27XBRD (SUTURE) IMPLANT
SUT VICRYL 0 AB UR-6 (SUTURE) IMPLANT
SYR 30ML LL (SYRINGE) ×4 IMPLANT
SYR 3ML LL SCALE MARK (SYRINGE) ×2 IMPLANT
TAPE MICROFOAM 4IN (TAPE) IMPLANT
TRAP SPECIMEN MUCOUS 40CC (MISCELLANEOUS) IMPLANT
TRAY FOLEY W/METER SILVER 16FR (SET/KITS/TRAYS/PACK) ×2 IMPLANT
TROCAR XCEL BLUNT TIP 100MML (ENDOMECHANICALS) IMPLANT
TROCAR Z-THREAD OPTICAL 5X100M (TROCAR) IMPLANT
TUBING INSUFFLATOR HI FLOW (MISCELLANEOUS) IMPLANT

## 2017-02-24 NOTE — Progress Notes (Signed)
Preoperative Review   Patient is met in the preoperatively. The history is reviewed in the chart and with the patient. I personally reviewed the options and rationale as well as the risks of this procedure that have been previously discussed with the patient. All questions asked by the patient and/or family were answered to their satisfaction.  Patient agrees to proceed with this procedure at this time.  Diego Pabon M.D. FACS   

## 2017-02-24 NOTE — Op Note (Signed)
PROCEDURES: 1. Exploratory laparotomy 2. Umbilical hernia repair  Pre-operative Diagnosis: Closed loop obstruction  Post-operative Diagnosis: Enteritis w/o mechanical obstruction  Surgeon: Marjory Lies Pabon   Anesthesia: General endotracheal anesthesia  ASA Class: 2  Surgeon: Caroleen Hamman , MD FACS  Anesthesia: Gen. with endotracheal tube  Findings: Reducible umbilical hernia Enteritis of the jejunum with elevation of the jejunum. No evidence of internal hernia or closed loop obstruction  Estimated Blood Loss: 5cc              Complications: none               Condition: stable  Procedure Details  The patient was seen again in the Holding Room. The benefits, complications, treatment options, and expected outcomes were discussed with the patient. The risks of bleeding, infection, recurrence of symptoms, failure to resolve symptoms,  bowel injury, any of which could require further surgery were reviewed with the patient.   The patient was taken to Operating Room, identified as Isabel Gonzalez and the procedure verified.  A Time Out was held and the above information confirmed.  Prior to the induction of general anesthesia, antibiotic prophylaxis was administered. VTE prophylaxis was in place. General endotracheal anesthesia was then administered and tolerated well. After the induction, the abdomen was prepped with Chloraprep and draped in the sterile fashion. The patient was positioned in the supine position.   Midline laparotomy performed with a 10 blade knife in the standard fashion. The fascia was incised with electrocautery and we entered the abdominal cavity under direct visualization. We encountered an umbilical hernia and the sac was excised. We eviscerated bowel showing evidence of dilation and inflammation of the jejunum with decompressed distal bowel. There was no evidence of mechanical obstruction there was no evidence of internal hernia or closed loop obstruction. The  bowel was viable but inflamed. We run the small bowel from the ligament of Treitz all the way to the TI and no other abnormalities were visualized. The colon also looked normal with some diverticulosis but no evidence of diverticulitis. An NG tube was confirmed within the body of the stomach.   We changed gloves and place a new tray to close the abdomen. Fascia closed with a 0 PDS suture in a running fashion incorporating the hernia defect. The skin was closed with 4-0 Monocryl. Liposomal Marcaine was injected on all incision sites under direct visualization. Dermabond was used to coat all the skin incisions. Needle and laparotomy count were correct and there were no immediate occasions  Caroleen Hamman, MD, FACS

## 2017-02-24 NOTE — Anesthesia Post-op Follow-up Note (Signed)
Anesthesia QCDR form completed.        

## 2017-02-24 NOTE — Progress Notes (Signed)
  Patient revisited and reexamine this morning.  Patient has some mild tenderness to deep palpation in the upper abdomen.  Abdominal x-ray this morning continues to show dilated loops of small bowel within the upper abdomen.  Discussed the findings in detail with the patient and recommended going to the operating room for a diagnostic laparoscopy possible exploratory laparotomy for likely internal hernia.  Patient voiced understanding and reluctantly agrees.  Operation will be performed by my partner Dr. Dahlia Byes who will see the patient again prior to operation.  Clayburn Pert, MD [facility]  [facility] [phone] Night [facility] [phone]

## 2017-02-24 NOTE — Anesthesia Preprocedure Evaluation (Signed)
Anesthesia Evaluation  Patient identified by MRN, date of birth, ID band Patient awake    Reviewed: Allergy & Precautions, H&P , NPO status , Patient's Chart, lab work & pertinent test results, reviewed documented beta blocker date and time   History of Anesthesia Complications (+) PROLONGED EMERGENCE and history of anesthetic complications  Airway Mallampati: I  TM Distance: >3 FB Neck ROM: full    Dental  (+) Caps, Dental Advidsory Given, Teeth Intact   Pulmonary neg pulmonary ROS, former smoker,           Cardiovascular Exercise Tolerance: Good (-) hypertension(-) angina(-) CAD, (-) Past MI, (-) Cardiac Stents and (-) CABG + dysrhythmias + Valvular Problems/Murmurs      Neuro/Psych negative neurological ROS  negative psych ROS   GI/Hepatic Neg liver ROS, GERD  ,  Endo/Other  negative endocrine ROS  Renal/GU negative Renal ROS  negative genitourinary   Musculoskeletal   Abdominal   Peds  Hematology negative hematology ROS (+)   Anesthesia Other Findings Past Medical History: No date: Anemia No date: Anxiety No date: Arthritis No date: Cancer ([place])     Comment:  squamous cell No date: Constipation No date: Dysrhythmia     Comment:  palpitations  No date: Environmental allergies No date: GERD (gastroesophageal reflux disease) No date: History of bladder infections No date: Hyperlipidemia No date: Murmur No date: Urinary problem   Reproductive/Obstetrics negative OB ROS                             Anesthesia Physical Anesthesia Plan  ASA: II  Anesthesia Plan: General   Post-op Pain Management:    Induction: Intravenous  PONV Risk Score and Plan: 3 and Ondansetron and Dexamethasone  Airway Management Planned: Oral ETT  Additional Equipment:   Intra-op Plan:   Post-operative Plan: Extubation in OR  Informed Consent: I have reviewed the patients History and Physical,  chart, labs and discussed the procedure including the risks, benefits and alternatives for the proposed anesthesia with the patient or authorized representative who has indicated his/her understanding and acceptance.   Dental Advisory Given  Plan Discussed with: Anesthesiologist, CRNA and Surgeon  Anesthesia Plan Comments:         Anesthesia Quick Evaluation

## 2017-02-24 NOTE — Anesthesia Postprocedure Evaluation (Signed)
Anesthesia Post Note  Patient: Isabel Gonzalez  Procedure(s) Performed: Procedure(s) (LRB): EXPLORATORY LAPAROTOMY and repair small hernia (N/A)  Patient location during evaluation: PACU Anesthesia Type: General Level of consciousness: awake and alert Pain management: pain level controlled Vital Signs Assessment: post-procedure vital signs reviewed and stable Respiratory status: spontaneous breathing, nonlabored ventilation, respiratory function stable and patient connected to nasal cannula oxygen Cardiovascular status: blood pressure returned to baseline and stable Postop Assessment: no apparent nausea or vomiting Anesthetic complications: no     Last Vitals:  Vitals:   02/24/17 1201 02/24/17 1305  BP: (!) 151/64 (!) 147/81  Pulse: 73 83  Resp: 16 16  Temp: 36.5 C 36.8 C  SpO2: 97% 98%    Last Pain:  Vitals:   02/24/17 1305  TempSrc: Oral  PainSc:                  Martha Clan

## 2017-02-24 NOTE — Progress Notes (Signed)
Operative findings d/w radiologist Dr. Jobe Igo

## 2017-02-24 NOTE — Transfer of Care (Signed)
Immediate Anesthesia Transfer of Care Note  Patient: Isabel Gonzalez  Procedure(s) Performed: Procedure(s): EXPLORATORY LAPAROTOMY and repair small hernia (N/A)  Patient Location: PACU  Anesthesia Type:General  Level of Consciousness: drowsy and patient cooperative  Airway & Oxygen Therapy: Patient Spontanous Breathing and Patient connected to face mask oxygen  Post-op Assessment: Report given to RN and Post -op Vital signs reviewed and stable  Post vital signs: Reviewed and stable  Last Vitals:  Vitals:   02/24/17 0434 02/24/17 1101  BP: 131/63 (!) 161/76  Pulse: 66 88  Resp: 18 16  Temp:  (!) 36.4 C  SpO2: 99% 98%    Last Pain:  Vitals:   02/24/17 1101  TempSrc:   PainSc: Asleep         Complications: No apparent anesthesia complications

## 2017-02-24 NOTE — Anesthesia Procedure Notes (Signed)
Procedure Name: Intubation Date/Time: 02/24/2017 9:46 AM Performed by: Jonna Clark Pre-anesthesia Checklist: Patient identified, Patient being monitored, Timeout performed, Emergency Drugs available and Suction available Patient Re-evaluated:Patient Re-evaluated prior to induction Oxygen Delivery Method: Circle system utilized Preoxygenation: Pre-oxygenation with 100% oxygen Induction Type: IV induction Ventilation: Mask ventilation without difficulty Laryngoscope Size: Mac and 3 Grade View: Grade I Tube type: Oral Tube size: 7.0 mm Number of attempts: 2 Airway Equipment and Method: Stylet Placement Confirmation: ETT inserted through vocal cords under direct vision,  positive ETCO2 and breath sounds checked- equal and bilateral Secured at: 21 cm Tube secured with: Tape Dental Injury: Teeth and Oropharynx as per pre-operative assessment  Difficulty Due To: Difficult Airway- due to anterior larynx

## 2017-02-25 ENCOUNTER — Encounter: Payer: Self-pay | Admitting: Surgery

## 2017-02-25 LAB — BASIC METABOLIC PANEL
Anion gap: 8 (ref 5–15)
BUN: 9 mg/dL (ref 6–20)
CALCIUM: 8.7 mg/dL — AB (ref 8.9–10.3)
CHLORIDE: 105 mmol/L (ref 101–111)
CO2: 25 mmol/L (ref 22–32)
Creatinine, Ser: 0.53 mg/dL (ref 0.44–1.00)
GFR calc Af Amer: >60 mL/min (ref >60)
Glucose, Bld: 112 mg/dL — ABNORMAL HIGH (ref 65–99)
Potassium: 3.6 mmol/L (ref 3.5–5.1)
SODIUM: 138 mmol/L (ref 135–145)

## 2017-02-25 LAB — CBC
HCT: 35.3 % (ref 35.0–47.0)
HEMOGLOBIN: 12.1 g/dL (ref 12.0–16.0)
MCH: 32.3 pg (ref 26.0–34.0)
MCHC: 34.3 g/dL (ref 32.0–36.0)
MCV: 94.2 fL (ref 80.0–100.0)
PLATELETS: 244 10*3/uL (ref 150–440)
RBC: 3.75 MIL/uL — ABNORMAL LOW (ref 3.80–5.20)
RDW: 13.2 % (ref 11.5–14.5)
WBC: 11.7 10*3/uL — ABNORMAL HIGH (ref 3.6–11.0)

## 2017-02-25 MED ORDER — PANTOPRAZOLE SODIUM 20 MG PO TBEC
20.0000 mg | DELAYED_RELEASE_TABLET | Freq: Two times a day (BID) | ORAL | Status: DC
  Filled 2017-02-25: qty 1

## 2017-02-25 MED ORDER — LACTATED RINGERS IV SOLN
INTRAVENOUS | Status: DC
Start: 2017-02-25 — End: 2017-02-26
  Administered 2017-02-25: 18:00:00 via INTRAVENOUS

## 2017-02-25 MED ORDER — PANTOPRAZOLE SODIUM 40 MG PO TBEC
40.0000 mg | DELAYED_RELEASE_TABLET | Freq: Two times a day (BID) | ORAL | Status: DC
  Administered 2017-02-25 – 2017-02-26 (×2): 40 mg via ORAL
  Filled 2017-02-25 (×2): qty 1

## 2017-02-25 MED ORDER — TRAMADOL HCL 50 MG PO TABS
100.0000 mg | ORAL_TABLET | Freq: Four times a day (QID) | ORAL | Status: DC
  Administered 2017-02-25 – 2017-02-26 (×4): 100 mg via ORAL
  Filled 2017-02-25 (×4): qty 2

## 2017-02-25 MED ORDER — ACETAMINOPHEN 325 MG PO TABS
650.0000 mg | ORAL_TABLET | Freq: Four times a day (QID) | ORAL | Status: DC | PRN
  Administered 2017-02-26: 650 mg via ORAL
  Filled 2017-02-25: qty 2

## 2017-02-25 MED ORDER — ALPRAZOLAM 0.5 MG PO TABS
0.5000 mg | ORAL_TABLET | Freq: Three times a day (TID) | ORAL | Status: DC | PRN
  Administered 2017-02-25 – 2017-02-27 (×3): 0.5 mg via ORAL
  Filled 2017-02-25 (×3): qty 1

## 2017-02-25 NOTE — Progress Notes (Signed)
POD # 1 Doing well Some soreness + flatus AVSS  PE  NAD Abd: soft, mild appropriate tenderness. No peritonitis. Some minimal contusion / hematoma abd wall from exparel  A/P Doing well Dc ngt Clears DC in am

## 2017-02-26 DIAGNOSIS — K529 Noninfective gastroenteritis and colitis, unspecified: Secondary | ICD-10-CM | POA: Diagnosis present

## 2017-02-26 DIAGNOSIS — K9189 Other postprocedural complications and disorders of digestive system: Secondary | ICD-10-CM | POA: Diagnosis not present

## 2017-02-26 DIAGNOSIS — Z79899 Other long term (current) drug therapy: Secondary | ICD-10-CM | POA: Diagnosis not present

## 2017-02-26 DIAGNOSIS — K567 Ileus, unspecified: Secondary | ICD-10-CM | POA: Diagnosis not present

## 2017-02-26 DIAGNOSIS — Z85828 Personal history of other malignant neoplasm of skin: Secondary | ICD-10-CM | POA: Diagnosis not present

## 2017-02-26 DIAGNOSIS — F419 Anxiety disorder, unspecified: Secondary | ICD-10-CM | POA: Diagnosis present

## 2017-02-26 DIAGNOSIS — K5730 Diverticulosis of large intestine without perforation or abscess without bleeding: Secondary | ICD-10-CM | POA: Diagnosis present

## 2017-02-26 DIAGNOSIS — Z87891 Personal history of nicotine dependence: Secondary | ICD-10-CM | POA: Diagnosis not present

## 2017-02-26 DIAGNOSIS — Z885 Allergy status to narcotic agent status: Secondary | ICD-10-CM | POA: Diagnosis not present

## 2017-02-26 DIAGNOSIS — E785 Hyperlipidemia, unspecified: Secondary | ICD-10-CM | POA: Diagnosis present

## 2017-02-26 DIAGNOSIS — J309 Allergic rhinitis, unspecified: Secondary | ICD-10-CM | POA: Diagnosis present

## 2017-02-26 DIAGNOSIS — Z7982 Long term (current) use of aspirin: Secondary | ICD-10-CM | POA: Diagnosis not present

## 2017-02-26 DIAGNOSIS — K429 Umbilical hernia without obstruction or gangrene: Secondary | ICD-10-CM | POA: Diagnosis present

## 2017-02-26 DIAGNOSIS — R1013 Epigastric pain: Secondary | ICD-10-CM | POA: Diagnosis present

## 2017-02-26 DIAGNOSIS — F411 Generalized anxiety disorder: Secondary | ICD-10-CM | POA: Diagnosis present

## 2017-02-26 DIAGNOSIS — E538 Deficiency of other specified B group vitamins: Secondary | ICD-10-CM | POA: Diagnosis present

## 2017-02-26 DIAGNOSIS — K219 Gastro-esophageal reflux disease without esophagitis: Secondary | ICD-10-CM | POA: Diagnosis present

## 2017-02-26 DIAGNOSIS — H409 Unspecified glaucoma: Secondary | ICD-10-CM | POA: Diagnosis present

## 2017-02-26 MED ORDER — ALUM & MAG HYDROXIDE-SIMETH 200-200-20 MG/5ML PO SUSP: 30.0000 mL | Freq: Four times a day (QID) | ORAL | Status: DC | PRN

## 2017-02-26 MED ORDER — PANTOPRAZOLE SODIUM 40 MG IV SOLR
40.0000 mg | INTRAVENOUS | Status: DC
  Administered 2017-02-26 – 2017-02-27 (×2): 40 mg via INTRAVENOUS
  Filled 2017-02-26 (×2): qty 40

## 2017-02-26 MED ORDER — KCL IN DEXTROSE-NACL 20-5-0.45 MEQ/L-%-% IV SOLN
INTRAVENOUS | Status: DC
  Administered 2017-02-26: 16:00:00 via INTRAVENOUS
  Filled 2017-02-26 (×2): qty 1000

## 2017-02-26 MED ORDER — TRAMADOL HCL 50 MG PO TABS: 50.0000 mg | ORAL_TABLET | Freq: Four times a day (QID) | ORAL | Status: DC

## 2017-02-26 NOTE — Progress Notes (Addendum)
[facility] Hospital Day(s): 0.   Post op day(s): 2 Days Post-Op.   Interval History: Patient seen and examined, no acute events overnight, though patient now reports worsened nausea without emesis overnight, denies flatus or BM, fever/chills, CP, or SOB.  Review of Systems:  Constitutional: denies fever, chills  HEENT: denies cough or congestion  Respiratory: denies any shortness of breath  Cardiovascular: denies chest pain or palpitations  Gastrointestinal: abdominal pain, N/V, and bowel function as per interval history Genitourinary: denies burning with urination or urinary frequency Musculoskeletal: denies pain, decreased motor or sensation Integumentary: denies any other rashes or skin discolorations except post-surgical abdominal wound Neurological: denies HA or vision/hearing changes   Vital signs in last 24 hours: [min-max] current  Temp:  [97.6 F (36.4 C)-98.3 F (36.8 C)] 97.6 F (36.4 C) (09/17 1513) Pulse Rate:  [63-77] 77 (09/17 1513) Resp:  [18-19] 19 (09/17 1513) BP: (126-165)/(53-71) 165/69 (09/17 1513) SpO2:  [92 %-97 %] 92 % (09/17 1513)     Height: 5\' 2"  (157.5 cm) Weight: 134 lb 12.8 oz (61.1 kg) BMI (Calculated): 24.65   Intake/Output this shift:  Total I/O In: 0  Out: 800 [Urine:800]   Intake/Output last 2 shifts:  @IOLAST2SHIFTS @   Physical Exam:  Constitutional: alert, cooperative and no distress  HENT: normocephalic without obvious abnormality  Eyes: PERRL, EOM's grossly intact and symmetric  Neuro: CN II - XII grossly intact and symmetric without deficit  Respiratory: breathing non-labored at rest  Cardiovascular: regular rate and sinus rhythm  Gastrointestinal: soft and non-distended with minimal peri-incisional tenderness to palpation, incision well-approximated without surrounding erythema or drainage Musculoskeletal: UE and LE FROM, no edema or wounds, motor and sensation grossly intact, NT   Labs:  CBC Latest Ref Rng &  Units 02/25/2017 02/24/2017 02/23/2017  WBC 3.6 - 11.0 K/uL 11.7(H) 8.6 9.6  Hemoglobin 12.0 - 16.0 g/dL 12.1 13.7 15.1  Hematocrit 35.0 - 47.0 % 35.3 39.7 43.0  Platelets 150 - 440 K/uL 244 240 287   CMP Latest Ref Rng & Units 02/25/2017 02/24/2017 02/23/2017  Glucose 65 - 99 mg/dL 112(H) 109(H) 130(H)  BUN 6 - 20 mg/dL 9 10 19   Creatinine 0.44 - 1.00 mg/dL 0.53 0.39(L) 0.59  Sodium 135 - 145 mmol/L 138 140 136  Potassium 3.5 - 5.1 mmol/L 3.6 3.8 3.8  Chloride 101 - 111 mmol/L 105 112(H) 101  CO2 22 - 32 mmol/L 25 24 25   Calcium 8.9 - 10.3 mg/dL 8.7(L) 8.9 9.7  Total Protein 6.5 - 8.1 g/dL - 6.0(L) 7.2  Total Bilirubin 0.3 - 1.2 mg/dL - 1.4(H) 1.0  Alkaline Phos 38 - 126 U/L - 63 69  AST 15 - 41 U/L - 28 35  ALT 14 - 54 U/L - 29 38   Imaging studies: No new pertinent imaging studies   Assessment/Plan: (ICD-10's: K52.9) 71 y.o. female with post-operative ileus 2 Days Post-Op s/p exploratory laparotomy for suspected SBO with internal hernia with no mechanical obstruction found at time of surgery, complicated by pertinent comorbidities including HLD, myocardial dysrhythmia with palpitations and murmur, GERD, chronic constipation, osteoarthritis, chronic anemia, generalized anxiety disorder, and a history of squamous cell carcinoma.   - continue CLD, IVF for now   - check am CBC and electrolytes   - pain control prn, minimize narcotics  - monitor bowel function and abdominal exam  - anti-emetics prn, reinsert NG tube if persistent emesis  - ambulation encouraged, DVT prophylaxis  - medical management of co-morbidities  All of the above findings and recommendations were discussed with the patient, patient's family (daughter bedside), and the medical team, and all of patient's and family's questions were answered to their expressed satisfaction.  -- Marilynne Drivers Rosana Hoes, MD, [place]: [facility] - Partnering for exceptional care. Office:  [phone]

## 2017-02-26 NOTE — Progress Notes (Signed)
Md notified of pt vomiting. Per piscoya please notify MD If pt vomits again, she may need ng. Orders to change po protonix to iv and continue to monitor.

## 2017-02-26 NOTE — Care Management Obs Status (Signed)
[facility] NOTIFICATION   Patient Details  Name: Isabel Gonzalez MRN: [id number] Date of Birth: 07-13-1945   Medicare Observation Status Notification Given:  Yes    Beverly Sessions, RN 02/26/2017, 11:05 AM

## 2017-02-27 LAB — CBC WITH DIFFERENTIAL/PLATELET
BASOS ABS: 0 10*3/uL (ref 0–0.1)
BASOS PCT: 0 %
EOS ABS: 0 10*3/uL (ref 0–0.7)
EOS PCT: 0 %
HCT: 37.1 % (ref 35.0–47.0)
Hemoglobin: 12.9 g/dL (ref 12.0–16.0)
LYMPHS PCT: 7 %
Lymphs Abs: 0.9 10*3/uL — ABNORMAL LOW (ref 1.0–3.6)
MCH: 31.9 pg (ref 26.0–34.0)
MCHC: 34.7 g/dL (ref 32.0–36.0)
MCV: 91.9 fL (ref 80.0–100.0)
Monocytes Absolute: 0.9 10*3/uL (ref 0.2–0.9)
Monocytes Relative: 7 %
Neutro Abs: 10.1 10*3/uL — ABNORMAL HIGH (ref 1.4–6.5)
Neutrophils Relative %: 86 %
PLATELETS: 250 10*3/uL (ref 150–440)
RBC: 4.04 MIL/uL (ref 3.80–5.20)
RDW: 12.2 % (ref 11.5–14.5)
WBC: 11.9 10*3/uL — AB (ref 3.6–11.0)

## 2017-02-27 LAB — BASIC METABOLIC PANEL
ANION GAP: 9 (ref 5–15)
BUN: <5 mg/dL — ABNORMAL LOW (ref 6–20)
CALCIUM: 8.5 mg/dL — AB (ref 8.9–10.3)
CO2: 26 mmol/L (ref 22–32)
Chloride: 90 mmol/L — ABNORMAL LOW (ref 101–111)
Creatinine, Ser: 0.45 mg/dL (ref 0.44–1.00)
Glucose, Bld: 124 mg/dL — ABNORMAL HIGH (ref 65–99)
POTASSIUM: 3.2 mmol/L — AB (ref 3.5–5.1)
SODIUM: 125 mmol/L — AB (ref 135–145)

## 2017-02-27 LAB — PHOSPHORUS: PHOSPHORUS: 2 mg/dL — AB (ref 2.5–4.6)

## 2017-02-27 LAB — MAGNESIUM: MAGNESIUM: 1.4 mg/dL — AB (ref 1.7–2.4)

## 2017-02-27 MED ORDER — MAGNESIUM SULFATE 2 GM/50ML IV SOLN
2.0000 g | Freq: Once | INTRAVENOUS | Status: AC
  Administered 2017-02-27: 2 g via INTRAVENOUS
  Filled 2017-02-27: qty 50

## 2017-02-27 MED ORDER — POTASSIUM CHLORIDE CRYS ER 20 MEQ PO TBCR
40.0000 meq | EXTENDED_RELEASE_TABLET | Freq: Once | ORAL | Status: AC
Start: 2017-02-27 — End: 2017-02-27
  Administered 2017-02-27: 40 meq via ORAL
  Filled 2017-02-27: qty 2

## 2017-02-27 MED ORDER — KCL IN DEXTROSE-NACL 40-5-0.9 MEQ/L-%-% IV SOLN
INTRAVENOUS | Status: DC
  Administered 2017-02-27 (×2): 1000 mL via INTRAVENOUS
  Filled 2017-02-27 (×5): qty 1000

## 2017-02-27 NOTE — Progress Notes (Signed)
[facility] Hospital Day(s): 1.   Post op day(s): 3 Days Post-Op.   Interval History: Patient seen and examined, no acute events or new complaints overnight. Patient reports she feels much better than she did yesterday with mild peri-incisional "soreness" and complete resolution of the nausea she experienced yesterday, still denies flatus or BM and likewise denies fever/chills, CP, or SOB.  Review of Systems:  Constitutional: denies fever, chills  HEENT: denies cough or congestion  Respiratory: denies any shortness of breath  Cardiovascular: denies chest pain or palpitations  Gastrointestinal: abdominal pain, N/V, and bowel function as per interval history Genitourinary: denies burning with urination or urinary frequency Musculoskeletal: denies pain, decreased motor or sensation Integumentary: denies any other rashes or skin discolorations except post-surgical abdominal wound Neurological: denies HA or vision/hearing changes   Vital signs in last 24 hours: [min-max] current  Temp:  [97.6 F (36.4 C)-98 F (36.7 C)] 98 F (36.7 C) (09/18 0337) Pulse Rate:  [63-80] 80 (09/18 0337) Resp:  [18-20] 20 (09/18 0337) BP: (132-173)/(53-78) 151/78 (09/18 0337) SpO2:  [92 %-97 %] 97 % (09/18 0337)     Height: 5\' 2"  (157.5 cm) Weight: 134 lb 12.8 oz (61.1 kg) BMI (Calculated): 24.65   Intake/Output this shift:  No intake/output data recorded.   Intake/Output last 2 shifts:  @IOLAST2SHIFTS @   Physical Exam:  Constitutional: alert, cooperative and no distress  HENT: normocephalic without obvious abnormality  Eyes: PERRL, EOM's grossly intact and symmetric  Neuro: CN II - XII grossly intact and symmetric without deficit  Respiratory: breathing non-labored at rest  Cardiovascular: regular rate and sinus rhythm  Gastrointestinal: soft and non-distended with minimal peri-incisional tenderness to palpation, incision well-approximated without any surrounding erythema or  drainage Musculoskeletal: UE and LE FROM, no edema or wounds, motor and sensation grossly intact, NT   Labs:  CBC Latest Ref Rng & Units 02/27/2017 02/25/2017 02/24/2017  WBC 3.6 - 11.0 K/uL 11.9(H) 11.7(H) 8.6  Hemoglobin 12.0 - 16.0 g/dL 12.9 12.1 13.7  Hematocrit 35.0 - 47.0 % 37.1 35.3 39.7  Platelets 150 - 440 K/uL 250 244 240   CMP Latest Ref Rng & Units 02/27/2017 02/25/2017 02/24/2017  Glucose 65 - 99 mg/dL 124(H) 112(H) 109(H)  BUN 6 - 20 mg/dL <5(L) 9 10  Creatinine 0.44 - 1.00 mg/dL 0.45 0.53 0.39(L)  Sodium 135 - 145 mmol/L 125(L) 138 140  Potassium 3.5 - 5.1 mmol/L 3.2(L) 3.6 3.8  Chloride 101 - 111 mmol/L 90(L) 105 112(H)  CO2 22 - 32 mmol/L 26 25 24   Calcium 8.9 - 10.3 mg/dL 8.5(L) 8.7(L) 8.9  Total Protein 6.5 - 8.1 g/dL - - 6.0(L)  Total Bilirubin 0.3 - 1.2 mg/dL - - 1.4(H)  Alkaline Phos 38 - 126 U/L - - 63  AST 15 - 41 U/L - - 28  ALT 14 - 54 U/L - - 29   Imaging studies: No new pertinent imaging studies   Assessment/Plan: (ICD-10's: K52.9) 71 y.o. female with post-operative ileus 3 Days Post-Op s/p exploratory laparotomy for suspected SBO with internal hernia with no mechanical obstruction found at time of surgery, complicated by pertinent comorbidities including HLD, myocardial dysrhythmia with palpitations and murmur, GERD, chronic constipation, osteoarthritis, chronic anemia, generalized anxiety disorder, and a history of squamous cell carcinoma.              - continue CLD, IVF for now              - pain control prn, minimize  narcotics             - check am CBC and correct electrolytes              - monitor bowel function and abdominal exam             - anti-emetics prn, reinsert NG tube if persistent emesis             - ambulation encouraged, DVT prophylaxis             - medical management of co-morbidities  All of the above findings and recommendations were discussed with the patient, patient's family (daughters x2 bedside), and the medical team, and  all of patient's and family's questions were answered to their expressed satisfaction.  -- Marilynne Drivers Rosana Hoes, MD, [place]: [facility] - Partnering for exceptional care. Office: [phone]

## 2017-02-27 NOTE — Progress Notes (Signed)
Prichard prayed silently for [name] available for follow up if needed.    02/27/17 1200  Clinical Encounter Type  Visited With Patient  Visit Type Initial;Spiritual support  Consult/Referral To Chaplain  Spiritual Encounters  Spiritual Needs Prayer

## 2017-02-27 NOTE — Discharge Instructions (Signed)
In addition to included general post-operative instructions for Exploratory Laparotomy,  Diet: Gradually resume home heart healthy diet.   Activity: No heavy lifting >20 pounds (children, pets, laundry, garbage) or strenuous activity until follow-up, but light activity and walking are encouraged. Do not drive or drink alcohol if taking narcotic pain medications.  Wound care: Okay to shower/get incision wet with soapy water and pat dry (do not rub incisions), but no baths or submerging incision underwater until follow-up.   Medications: Resume all home medications. For mild to moderate pain: acetaminophen (Tylenol) or ibuprofen (if no kidney disease). Combining Tylenol with alcohol can substantially increase your risk of causing liver disease. Narcotic pain medications, if prescribed, can be used for severe pain, though may cause nausea, constipation, and drowsiness. Do not combine Tylenol and Percocet within a 6 hour period as Percocet contains Tylenol. If you do not need the narcotic pain medication, you do not need to fill the prescription.  Call office [phone]) at any time if any questions, worsening pain, fevers/chills, bleeding, drainage from incision site, or other concerns.

## 2017-02-28 ENCOUNTER — Telehealth: Payer: Self-pay

## 2017-02-28 LAB — BASIC METABOLIC PANEL
Anion gap: 5 (ref 5–15)
BUN: 6 mg/dL (ref 6–20)
CALCIUM: 8.6 mg/dL — AB (ref 8.9–10.3)
CHLORIDE: 111 mmol/L (ref 101–111)
CO2: 24 mmol/L (ref 22–32)
CREATININE: 0.6 mg/dL (ref 0.44–1.00)
GFR calc non Af Amer: >60 mL/min (ref >60)
Glucose, Bld: 110 mg/dL — ABNORMAL HIGH (ref 65–99)
Potassium: 4.2 mmol/L (ref 3.5–5.1)
SODIUM: 140 mmol/L (ref 135–145)

## 2017-02-28 LAB — CBC
HCT: 36.2 % (ref 35.0–47.0)
Hemoglobin: 12.7 g/dL (ref 12.0–16.0)
MCH: 33.1 pg (ref 26.0–34.0)
MCHC: 35 g/dL (ref 32.0–36.0)
MCV: 94.6 fL (ref 80.0–100.0)
PLATELETS: 269 10*3/uL (ref 150–440)
RBC: 3.83 MIL/uL (ref 3.80–5.20)
RDW: 12.8 % (ref 11.5–14.5)
WBC: 6.3 10*3/uL (ref 3.6–11.0)

## 2017-02-28 NOTE — Progress Notes (Signed)
Resumed care at 2300 received report from Brooke RN  

## 2017-02-28 NOTE — Progress Notes (Signed)
Patient discharged to home as ordered, discharge instructions and follow up appointments given as ordered. Patient is alert and oriented, ambulates well without assistance. Denies pain at this time. No acute distress noted. Patient daughter at the bedside to take patient home

## 2017-02-28 NOTE — Discharge Summary (Signed)
Physician Discharge Summary  Patient ID: Isabel Gonzalez MRN: [id number] DOB/AGE: 12-Oct-1945 71 y.o.  Admit date: 02/23/2017 Discharge date: 02/28/2017  Admission Diagnoses: closed loop small bowel obstruction  Discharge Diagnoses: non-obstructive enteritis s/p exploratory laparotomy Active Problems:   Abdominal pain   Ileus, postoperative [facility])   Discharged Condition: good  Hospital Course: 71 year old Female presented to [facility] ED for severe crampy abdominal pain without history of prior abdominal surgeries. Workup was significant for CT imaging concerning for closed loop small bowel obstruction with possible internal hernia. Informed consent was obtained, and patient underwent exploratory laparotomy ([place], 9/15), during which no evidence of obstruction was found. Post-operatively, NG tube was removed on POD#1, after which patient experienced post-operative ileus and a brief episode of delerium/confusion, which both gradually resolved. Otherwise, the remainder of patient's hospital course was essentially unremarkable with ongoing ambulation and pain well-controlled. Patient's diet was safely able to be advanced, and discharge planning was initiated. On POD#4, patient was safely able to be discharged home with appropriate discharge instructions, pain control, and outpatient surgical follow-up after all of her and her family's questions were answered to their expressed satisfaction.  Consults: None  Significant Diagnostic Studies: radiology: CT scan: closed loop small bowel obstruction  Treatments: IV hydration, surgery: exploratory laparotomy without small bowel resection  Discharge Exam: Blood pressure (!) 125/95, pulse 72, temperature 98 F (36.7 C), temperature source Oral, resp. rate 18, height 5\' 2"  (1.575 m), weight 134 lb 12.8 oz (61.1 kg), SpO2 100 %. General appearance: alert, cooperative and no distress GI: abdomen soft and non-distended with minimal peri-incisional  tenderness to palpation, post-surgical wound well-approximated without erythema or drainage  Disposition: 01-Home or Self Care   Allergies as of 02/28/2017      Reactions   Percocet [oxycodone-acetaminophen] Nausea And Vomiting   Vicodin [hydrocodone-acetaminophen] Nausea And Vomiting      Medication List    TAKE these medications   ALPRAZolam 0.5 MG tablet Commonly known as:  XANAX TAKE 1/2-1 TABLET BY MOUTH DAILY AS NEEDED   aspirin 81 MG EC tablet Take 81 mg by mouth daily.   CALTRATE 600 PLUS-VIT D PO Take 1 tablet by mouth daily.   CULTURELLE DIGESTIVE HEALTH PO Take 1 tablet by mouth daily.   diltiazem 30 MG tablet Commonly known as:  CARDIZEM Take 1 tablet (30 mg total) by mouth 3 (three) times daily as needed. What changed:  when to take this   ezetimibe 10 MG tablet Commonly known as:  ZETIA Take 1 tablet (10 mg total) by mouth daily.   fluticasone 50 MCG/ACT nasal spray Commonly known as:  FLONASE USE TWO PUFFS INTO EACH NOSTRIL DAILY What changed:  how much to take  how to take this  when to take this  reasons to take this  additional instructions   POTASSIUM PO Take by mouth daily.   vitamin B-12 1000 MCG tablet Commonly known as:  CYANOCOBALAMIN Take 1 tablet (1,000 mcg total) by mouth daily.   vitamin C 500 MG tablet Commonly known as:  ASCORBIC ACID Take 500 mg by mouth daily.   ZYRTEC ALLERGY 10 MG tablet Generic drug:  cetirizine Take 10 mg by mouth daily.      Follow-up [place]. Go on 03/12/2017.   Specialty:  General Surgery Why:  Dr. Dahlia Byes, Monday, 10/1 at 9:45 a.m., [facility] office [phone] Contact information: [address] [place] [phone]  Signed: Vickie Epley 02/28/2017, 10:19 AM

## 2017-02-28 NOTE — Telephone Encounter (Signed)
Post-op call made to patient at this time. Spoke with Isabel Gonzalez. Post-op interview questions below.  1. How are you feeling? Feeling fine  2. Is your pain controlled? Yes  3. What are you doing for the pain? Aleve  4. Are you having any Nausea or Vomiting? None  5. Are you having any Fever or Chills? None  6. Are you having any Constipation or Diarrhea? Has yet to have a bowel movement advised to use Miralax with 72 ounces of water.  7. Is there any Swelling or Bruising you are concerned about? None  8. Do you have any questions or concerns at this time? None   Discussion: Reminded of post op appointment on 10/1 at 10AM with Dr. Dahlia Byes.

## 2017-03-06 ENCOUNTER — Other Ambulatory Visit: Payer: Self-pay

## 2017-03-06 NOTE — Patient Outreach (Signed)
[facility]) Care Management  03/06/2017  Isabel Gonzalez 09/17/1945 [id number]   EMMI: General Discharge Referral date: 03/06/17 Referral source: EMMI general discharge red alert Referral reason: Loss interest in things?  YES Day # 4  Telephone call to patient regarding EMMI general discharge red alert. HIPAA verified with patient. Patient states she has not loss interest in things. Patient states she is feeling very good. Patient states 2 of her friends came to visit her for lunch today.  Patient states she has a follow up appointment with her doctor on Thursday 03/08/17 and a follow up with her surgeon on 03/12/17. Patient states she is taking aleve for pain.  Patient states her pain level is much better.  RNCM reviewed signs and symptoms of infection with patient. Patient states her incision site looks good and is without signs of infection.   ASSESSMENT; Per patients discharge summary: Admit date: 02/23/2017 Discharge date: 02/28/2017  Admission Diagnoses: closed loop small bowel obstruction  Discharge Diagnoses: non-obstructive enteritis s/p exploratory laparotomy Active Problems:   Abdominal pain   Ileus, postoperative ([place])  PLAN: RNCM will refer patient to care management assistant to close due to patient being assessed and having no further needs.  RNCM will notify patients primary MD of closure.  RNCM  Will send patient [facility] care management brochure for future use.   Quinn Plowman RN,BSN,CCM [facility] Telephonic  [phone]

## 2017-03-08 ENCOUNTER — Ambulatory Visit (INDEPENDENT_AMBULATORY_CARE_PROVIDER_SITE_OTHER): Payer: PPO | Admitting: Physician Assistant

## 2017-03-08 ENCOUNTER — Encounter: Payer: Self-pay | Admitting: Physician Assistant

## 2017-03-08 VITALS — BP 130/80 | HR 72 | Temp 98.7°F | Resp 16 | Wt 128.0 lb

## 2017-03-08 DIAGNOSIS — K567 Ileus, unspecified: Secondary | ICD-10-CM | POA: Diagnosis not present

## 2017-03-08 DIAGNOSIS — K9189 Other postprocedural complications and disorders of digestive system: Secondary | ICD-10-CM

## 2017-03-08 DIAGNOSIS — Z9189 Other specified personal risk factors, not elsewhere classified: Secondary | ICD-10-CM | POA: Diagnosis not present

## 2017-03-08 DIAGNOSIS — K429 Umbilical hernia without obstruction or gangrene: Secondary | ICD-10-CM | POA: Diagnosis not present

## 2017-03-08 DIAGNOSIS — K56609 Unspecified intestinal obstruction, unspecified as to partial versus complete obstruction: Secondary | ICD-10-CM

## 2017-03-08 DIAGNOSIS — IMO0001 Reserved for inherently not codable concepts without codable children: Secondary | ICD-10-CM

## 2017-03-08 NOTE — Progress Notes (Signed)
Patient: Isabel Gonzalez Female    DOB: [birth year]   71 y.o.   MRN: [id number] Visit Date: 03/08/2017  Today's Provider: [name], [name]   Chief Complaint  Patient presents with  . Hospitalization Follow-up   Subjective:    HPI  Follow up Hospitalization  Patient was admitted to [facility] on 02/23/2017 and discharged on 02/28/2017. She was treated possible SBO and internal hernia. Treatment for this included exploratory Laparotomy by Dr. Dahlia Byes. Telephone follow up was done on 03/06/2017. She reports excellent compliance with treatment. She reports this condition is Improved.  She was admitted to the hospital on 02/23/17 with epigastric pain, nausea and diarrhea. CT Abd/pelvis showed possible closed loop SBO and possible internal hernia. She underwent exploratory laproscopy on 02/24/17. No bowel obstruction was noted. She was found to have enteritis of the jejunum and a small umbilical hernia that was reducible. Surgery was complicated by postoperative delirium and ileus.   She reports she is feeling very well. She still has some fatigue but otherwise has no complaints. She is having normal BM. No abdominal pain. Incisions are healing well. She has a f/u with Dr. Dahlia Byes on 03/12/17.  ------------------------------------------------------------------------------------    Allergies  Allergen Reactions  . Percocet [Oxycodone-Acetaminophen] Nausea And Vomiting  . Vicodin [Hydrocodone-Acetaminophen] Nausea And Vomiting     Current Outpatient Prescriptions:  .  ALPRAZolam (XANAX) 0.5 MG tablet, TAKE 1/2-1 TABLET BY MOUTH DAILY AS NEEDED, Disp: 30 tablet, Rfl: 5 .  aspirin 81 MG EC tablet, Take 81 mg by mouth daily. , Disp: , Rfl:  .  Calcium-Vitamin D (CALTRATE 600 PLUS-VIT D PO), Take 1 tablet by mouth daily. , Disp: , Rfl:  .  cetirizine (ZYRTEC ALLERGY) 10 MG tablet, Take 10 mg by mouth daily., Disp: , Rfl:  .  diltiazem (CARDIZEM) 30 MG tablet, Take 1 tablet (30  mg total) by mouth 3 (three) times daily as needed. (Patient taking differently: Take 30 mg by mouth at bedtime. ), Disp: 60 tablet, Rfl: 3 .  ezetimibe (ZETIA) 10 MG tablet, Take 1 tablet (10 mg total) by mouth daily., Disp: 30 tablet, Rfl: 11 .  fluticasone (FLONASE) 50 MCG/ACT nasal spray, USE TWO PUFFS INTO EACH NOSTRIL DAILY (Patient taking differently: Place 1 spray into both nostrils as needed. USE TWO PUFFS INTO EACH NOSTRIL DAILY), Disp: 16 g, Rfl: 5 .  Lactobacillus-Inulin ([place] PO), Take 1 tablet by mouth daily., Disp: , Rfl:  .  POTASSIUM PO, Take by mouth daily., Disp: , Rfl:  .  vitamin B-12 (CYANOCOBALAMIN) 1000 MCG tablet, Take 1 tablet (1,000 mcg total) by mouth daily., Disp: 30 tablet, Rfl: 0 .  vitamin C (ASCORBIC ACID) 500 MG tablet, Take 500 mg by mouth daily., Disp: , Rfl:   Review of Systems  Constitutional: Negative.   Respiratory: Negative.   Cardiovascular: Positive for palpitations (followed by Cardiology; appt on 03/12/17 with Dr. Rockey Situ). Negative for chest pain and leg swelling.  Gastrointestinal: Negative.   Skin: Positive for wound (insicional; healing).  Neurological: Positive for dizziness (mild dizziness with standing). Negative for weakness, light-headedness, numbness and headaches.    Social History  Substance Use Topics  . Smoking status: Former Smoker    Packs/day: 0.25    Years: 25.00    Quit date: 06/11/1990  . Smokeless tobacco: Never Used  . Alcohol use 3.0 oz/week    5 Glasses of wine per week     Comment: ocassionally  Objective:   BP 130/80 (BP Location: Left Arm, Patient Position: Sitting, Cuff Size: Normal)   Pulse 72   Temp 98.7 F (37.1 C) (Oral)   Resp 16   Wt 128 lb (58.1 kg)   SpO2 99%   BMI 23.41 kg/m    Physical Exam  Constitutional: She is oriented to person, place, and time. She appears well-developed and well-nourished. No distress.  Neck: Normal range of motion. Neck supple.  Cardiovascular:  Normal rate, regular rhythm and normal heart sounds.  Exam reveals no gallop and no friction rub.   No murmur heard. Pulmonary/Chest: Effort normal and breath sounds normal. No respiratory distress. She has no wheezes. She has no rales.  Abdominal:  Surgical incisions healing well  Neurological: She is alert and oriented to person, place, and time.  Skin: She is not diaphoretic.  Psychiatric: She has a normal mood and affect. Her behavior is normal. Judgment and thought content normal.  Vitals reviewed.      Assessment & Plan:     1. Transition of care performed with sharing of clinical summary Notes, lab results and images reviewed from hospitalization between the dates of 02/23/17-02/28/17. Phone f/u done on 03/06/17.   2. Small bowel obstruction ([place]) Most likely self resolved during the 24 hr NPO prior to surgery. No evidence found on exploratory laparotomy. Has f/u with Dr. Dahlia Byes on 03/12/17.   3. Ileus, postoperative (HCC) Improved. No issues currently.   4. Umbilical hernia without obstruction and without gangrene Reducible. Patient has never had symptoms previously. Still asymptomatic.        [name], [name]  [facility]

## 2017-03-12 ENCOUNTER — Encounter: Payer: Self-pay | Admitting: Surgery

## 2017-03-12 ENCOUNTER — Ambulatory Visit (INDEPENDENT_AMBULATORY_CARE_PROVIDER_SITE_OTHER): Payer: PPO | Admitting: Surgery

## 2017-03-12 VITALS — BP 169/100 | HR 86 | Temp 98.3°F | Wt 129.0 lb

## 2017-03-12 DIAGNOSIS — Z09 Encounter for follow-up examination after completed treatment for conditions other than malignant neoplasm: Secondary | ICD-10-CM

## 2017-03-12 NOTE — Patient Instructions (Signed)

## 2017-03-12 NOTE — Progress Notes (Signed)
S/p laparotomy for presumed closed loop obstruction No evidence of obstruction or internal hernia intra op Doing well Taking Po Minimal pain  PE NAD Abd: incisions c/d/i, no infection or peritonitis  A/P Doing very well No heavy lifting RTC prn

## 2017-03-13 ENCOUNTER — Ambulatory Visit (INDEPENDENT_AMBULATORY_CARE_PROVIDER_SITE_OTHER): Payer: PPO

## 2017-03-13 VITALS — BP 146/78 | HR 68 | Temp 97.8°F | Wt 130.2 lb

## 2017-03-13 DIAGNOSIS — Z0000 Encounter for general adult medical examination without abnormal findings: Secondary | ICD-10-CM

## 2017-03-13 NOTE — Progress Notes (Signed)
Subjective:   Isabel Gonzalez is a 71 y.o. female who presents for Medicare Annual (Subsequent) preventive examination.  Review of Systems:  N/A  Cardiac Risk Factors include: advanced age (>65men, >72 women);hypertension;sedentary lifestyle     Objective:     Vitals: BP (!) 146/78 (BP Location: Left Arm)   Pulse 68   Temp 97.8 F (36.6 C) (Oral)   Wt 130 lb 3.2 oz (59.1 kg)   BMI 23.81 kg/m   Body mass index is 23.81 kg/m.   Tobacco History  Smoking Status  . Former Smoker  . Packs/day: 0.25  . Years: 25.00  . Quit date: 06/11/1990  Smokeless Tobacco  . Never Used     Counseling given: Not Answered   Past Medical History:  Diagnosis Date  . Anemia   . Anxiety   . Arthritis   . Cancer (HCC)    squamous cell  . Constipation   . Dysrhythmia    palpitations   . Environmental allergies   . GERD (gastroesophageal reflux disease)   . History of bladder infections   . Hyperlipidemia   . Murmur   . Urinary problem    Past Surgical History:  Procedure Laterality Date  . BREAST REDUCTION SURGERY    . FACIAL COSMETIC SURGERY    . LAPAROTOMY N/A 02/24/2017   Procedure: EXPLORATORY LAPAROTOMY and repair small hernia;  Surgeon: Jules Husbands, MD;  Location: [facility] ORS;  Service: General;  Laterality: N/A;  . ORIF HUMERUS FRACTURE Right 07/08/2015   Procedure: OPEN REDUCTION INTERNAL FIXATION (ORIF) HUMERAL SHAFT FRACTURE with bone graft and allograft;  Surgeon: Justice Britain, MD;  Location: [facility];  Service: Orthopedics;  Laterality: Right;  . SKIN SURGERY     squamous cell   Family History  Problem Relation Age of Onset  . Stroke Mother 12  . Diabetes Brother    History  Sexual Activity  . Sexual activity: Not on file    Outpatient Encounter Prescriptions as of 03/13/2017  Medication Sig  . ALPRAZolam (XANAX) 0.5 MG tablet TAKE 1/2-1 TABLET BY MOUTH DAILY AS NEEDED  . aspirin 81 MG EC tablet Take 81 mg by mouth daily.   . Calcium-Vitamin D (CALTRATE  600 PLUS-VIT D PO) Take 1 tablet by mouth daily.   . cetirizine (ZYRTEC ALLERGY) 10 MG tablet Take 10 mg by mouth daily.  Marland Kitchen diltiazem (CARDIZEM) 30 MG tablet Take 1 tablet (30 mg total) by mouth 3 (three) times daily as needed. (Patient taking differently: Take 30 mg by mouth at bedtime. )  . ezetimibe (ZETIA) 10 MG tablet Take 1 tablet (10 mg total) by mouth daily.  . fluticasone (FLONASE) 50 MCG/ACT nasal spray USE TWO PUFFS INTO EACH NOSTRIL DAILY (Patient taking differently: Place 1 spray into both nostrils as needed. USE TWO PUFFS INTO EACH NOSTRIL DAILY)  . POTASSIUM PO Take by mouth daily.  . vitamin B-12 (CYANOCOBALAMIN) 1000 MCG tablet Take 1 tablet (1,000 mcg total) by mouth daily.  . vitamin C (ASCORBIC ACID) 500 MG tablet Take 500 mg by mouth daily.   No facility-administered encounter medications on file as of 03/13/2017.     Activities of Daily Living In your present state of health, do you have any difficulty performing the following activities: 03/13/2017 02/23/2017  Hearing? N N  Vision? N N  Difficulty concentrating or making decisions? N N  Walking or climbing stairs? N N  Dressing or bathing? N N  Doing errands, shopping? N N  Preparing Food and eating ? N -  Using the Toilet? N -  In the past six months, have you accidently leaked urine? N -  Do you have problems with loss of bowel control? N -  Managing your Medications? N -  Managing your Finances? N -  Housekeeping or managing your Housekeeping? N -  Some recent data might be hidden    Patient Care Team: [name], [name] as PCP - General (Family Medicine) Minna Merritts, MD as Consulting Physician (Cardiology) Oneta Rack, MD as Consulting Physician (Dermatology)    Assessment:     Exercise Activities and Dietary recommendations Current Exercise Habits: Home exercise routine, Type of exercise: walking, Time (Minutes): 15, Frequency (Times/Week): 5, Weekly Exercise (Minutes/Week): 75,  Intensity: Mild, Exercise limited by: Other - see comments (recovering from surgery)  Goals    . Exercise 150 minutes per week (moderate activity)      Fall Risk Fall Risk  03/13/2017 03/27/2016 03/19/2015  Falls in the past year? No No Yes  Number falls in past yr: - - 1  Injury with Fall? - - Yes   Depression Screen PHQ 2/9 Scores 03/13/2017 03/13/2017 03/27/2016 03/19/2015  PHQ - 2 Score 0 0 0 0  PHQ- 9 Score 2 - - -     Cognitive Function- Pt declined screening today.        Immunization History  Administered Date(s) Administered  . Influenza Split 03/29/2009  . Pneumococcal Conjugate-13 12/10/2013  . Pneumococcal Polysaccharide-23 06/02/2010  . Td 06/29/2003  . Zoster 04/18/2006   Screening Tests Health Maintenance  Topic Date Due  . Hepatitis C Screening  [birth year]  . TETANUS/TDAP  06/28/2013  . INFLUENZA VACCINE  01/10/2017  . MAMMOGRAM  03/27/2017 (Originally 08/03/1994)  . COLONOSCOPY  04/29/2022  . DEXA SCAN  Completed  . PNA vac Low Risk Adult  Completed      Plan:  I have personally reviewed and addressed the Medicare Annual Wellness questionnaire and have noted the following in the patient's chart:  A. Medical and social history B. Use of alcohol, tobacco or illicit drugs  C. Current medications and supplements D. Functional ability and status E.  Nutritional status F.  Physical activity G. Advance directives H. List of other physicians I.  Hospitalizations, surgeries, and ER visits in previous 12 months J.  [place] such as hearing and vision if needed, cognitive and depression L. Referrals and appointments - none  In addition, I have reviewed and discussed with patient certain preventive protocols, quality metrics, and best practice recommendations. A written personalized care plan for preventive services as well as general preventive health recommendations were provided to patient.  See attached scanned questionnaire for additional  information.   Signed,  Fabio Neighbors, LPN Nurse Health Advisor   MD Recommendations: Pt declined the Hepatitis C screening, tetanus and flu vaccines today. Pt will receive vaccines at pharmacy and the Hep C blood test along with other blood work at next [place].

## 2017-03-13 NOTE — Patient Instructions (Signed)
Ms. Demont , Thank you for taking time to come for your Medicare Wellness Visit. I appreciate your ongoing commitment to your health goals. Please review the following plan we discussed and let me know if I can assist you in the future.   Screening recommendations/referrals: Colonoscopy: up to date Mammogram: up to date Bone Density: up to date Recommended yearly ophthalmology/optometry visit for glaucoma screening and checkup Recommended yearly dental visit for hygiene and checkup  Vaccinations: Influenza vaccine: declined Pneumococcal vaccine: completed series Tdap vaccine: declined Shingles vaccine: completed  Advanced directives: Advance directive discussed with you today. Even though you declined this today please call our office should you change your mind and we can give you the proper paperwork for you to fill out.  Conditions/risks identified: Recommend to continue trying to work back to exercising 5 days a week (post surgery).  Next appointment: [place] appointment today.   Preventive Care 21 Years and Older, Female Preventive care refers to lifestyle choices and visits with your health care provider that can promote health and wellness. What does preventive care include?  A yearly physical exam. This is also called an annual well check.  Dental exams once or twice a year.  Routine eye exams. Ask your health care provider how often you should have your eyes checked.  Personal lifestyle choices, including:  Daily care of your teeth and gums.  Regular physical activity.  Eating a healthy diet.  Avoiding tobacco and drug use.  Limiting alcohol use.  Practicing safe sex.  Taking low-dose aspirin every day.  Taking vitamin and mineral supplements as recommended by your health care provider. What happens during an annual well check? The services and screenings done by your health care provider during your annual well check will depend on your age, overall  health, lifestyle risk factors, and family history of disease. Counseling  Your health care provider may ask you questions about your:  Alcohol use.  Tobacco use.  Drug use.  Emotional well-being.  Home and relationship well-being.  Sexual activity.  Eating habits.  History of falls.  Memory and ability to understand (cognition).  Work and work Statistician.  Reproductive health. Screening  You may have the following tests or measurements:  Height, weight, and BMI.  Blood pressure.  Lipid and cholesterol levels. These may be checked every 5 years, or more frequently if you are over 55 years old.  Skin check.  Lung cancer screening. You may have this screening every year starting at age 54 if you have a 30-pack-year history of smoking and currently smoke or have quit within the past 15 years.  Fecal occult blood test (FOBT) of the stool. You may have this test every year starting at age 85.  Flexible sigmoidoscopy or colonoscopy. You may have a sigmoidoscopy every 5 years or a colonoscopy every 10 years starting at age 14.  Hepatitis C blood test.  Hepatitis B blood test.  Sexually transmitted disease (STD) testing.  Diabetes screening. This is done by checking your blood sugar (glucose) after you have not eaten for a while (fasting). You may have this done every 1-3 years.  Bone density scan. This is done to screen for osteoporosis. You may have this done starting at age 23.  Mammogram. This may be done every 1-2 years. Talk to your health care provider about how often you should have regular mammograms. Talk with your health care provider about your test results, treatment options, and if necessary, the need for more tests. Vaccines  Your health care provider may recommend certain vaccines, such as:  Influenza vaccine. This is recommended every year.  Tetanus, diphtheria, and acellular pertussis (Tdap, Td) vaccine. You may need a Td booster every 10  years.  Zoster vaccine. You may need this after age 65.  Pneumococcal 13-valent conjugate (PCV13) vaccine. One dose is recommended after age 14.  Pneumococcal polysaccharide (PPSV23) vaccine. One dose is recommended after age 45. Talk to your health care provider about which screenings and vaccines you need and how often you need them. This information is not intended to replace advice given to you by your health care provider. Make sure you discuss any questions you have with your health care provider. Document Released: 06/25/2015 Document Revised: 02/16/2016 Document Reviewed: 03/30/2015 Elsevier Interactive Patient Education  2017 [place] Prevention in the Home Falls can cause injuries. They can happen to people of all ages. There are many things you can do to make your home safe and to help prevent falls. What can I do on the outside of my home?  Regularly fix the edges of walkways and driveways and fix any cracks.  Remove anything that might make you trip as you walk through a door, such as a raised step or threshold.  Trim any bushes or trees on the path to your home.  Use bright outdoor lighting.  Clear any walking paths of anything that might make someone trip, such as rocks or tools.  Regularly check to see if handrails are loose or broken. Make sure that both sides of any steps have handrails.  Any raised decks and porches should have guardrails on the edges.  Have any leaves, snow, or ice cleared regularly.  Use sand or salt on walking paths during winter.  Clean up any spills in your garage right away. This includes oil or grease spills. What can I do in the bathroom?  Use night lights.  Install grab bars by the toilet and in the tub and shower. Do not use towel bars as grab bars.  Use non-skid mats or decals in the tub or shower.  If you need to sit down in the shower, use a plastic, non-slip stool.  Keep the floor dry. Clean up any water that  spills on the floor as soon as it happens.  Remove soap buildup in the tub or shower regularly.  Attach bath mats securely with double-sided non-slip rug tape.  Do not have throw rugs and other things on the floor that can make you trip. What can I do in the bedroom?  Use night lights.  Make sure that you have a light by your bed that is easy to reach.  Do not use any sheets or blankets that are too big for your bed. They should not hang down onto the floor.  Have a firm chair that has side arms. You can use this for support while you get dressed.  Do not have throw rugs and other things on the floor that can make you trip. What can I do in the kitchen?  Clean up any spills right away.  Avoid walking on wet floors.  Keep items that you use a lot in easy-to-reach places.  If you need to reach something above you, use a strong step stool that has a grab bar.  Keep electrical cords out of the way.  Do not use floor polish or wax that makes floors slippery. If you must use wax, use non-skid floor wax.  Do  not have throw rugs and other things on the floor that can make you trip. What can I do with my stairs?  Do not leave any items on the stairs.  Make sure that there are handrails on both sides of the stairs and use them. Fix handrails that are broken or loose. Make sure that handrails are as long as the stairways.  Check any carpeting to make sure that it is firmly attached to the stairs. Fix any carpet that is loose or worn.  Avoid having throw rugs at the top or bottom of the stairs. If you do have throw rugs, attach them to the floor with carpet tape.  Make sure that you have a light switch at the top of the stairs and the bottom of the stairs. If you do not have them, ask someone to add them for you. What else can I do to help prevent falls?  Wear shoes that:  Do not have high heels.  Have rubber bottoms.  Are comfortable and fit you well.  Are closed at the  toe. Do not wear sandals.  If you use a stepladder:  Make sure that it is fully opened. Do not climb a closed stepladder.  Make sure that both sides of the stepladder are locked into place.  Ask someone to hold it for you, if possible.  Clearly mark and make sure that you can see:  Any grab bars or handrails.  First and last steps.  Where the edge of each step is.  Use tools that help you move around (mobility aids) if they are needed. These include:  Canes.  Walkers.  Scooters.  Crutches.  Turn on the lights when you go into a dark area. Replace any light bulbs as soon as they burn out.  Set up your furniture so you have a clear path. Avoid moving your furniture around.  If any of your floors are uneven, fix them.  If there are any pets around you, be aware of where they are.  Review your medicines with your doctor. Some medicines can make you feel dizzy. This can increase your chance of falling. Ask your doctor what other things that you can do to help prevent falls. This information is not intended to replace advice given to you by your health care provider. Make sure you discuss any questions you have with your health care provider. Document Released: 03/25/2009 Document Revised: 11/04/2015 Document Reviewed: 07/03/2014 Elsevier Interactive Patient Education  2017 [employer].

## 2017-03-14 DIAGNOSIS — I700 Atherosclerosis of aorta: Secondary | ICD-10-CM | POA: Insufficient documentation

## 2017-03-14 NOTE — Progress Notes (Signed)
Cardiology Office Note  Date:  03/15/2017   ID:  Krystle Polcyn, DOB 09-22-1945, MRN [id number]  PCP:  [name], [name]   Chief Complaint  Patient presents with  . other    6 month f/u no complaints today. Meds reviewed verbally with pt.    HPI:  71 year old woman with past medical history of  hyperlipidemia,  Anxiety/panic attacks remote smoking who stopped 25 years ago,  tachypalpitations, PVCs mild to moderate aortic atherosclerosis seen on CT scan  who presents for routine followup of her PAD,  Tachycardia  Emergency surgery 3 weeks ago Severe ABD pain surgery, etiology  D/C 02/28/2017 Long discussion concerning the surgery Surgical report discussed with her, inflammation of jejunum, umbilicus hernia fixed  Reports blood pressure has been stable Recent weight loss from the surgery No recent lipid panel available Tolerating Zetia Previously had statin intolerance  EKG personally reviewed by myself on todays visit Shows normal sinus rhythm with rate 80 beats per minute  Past medical history reviewed  previous  fall while she was running steps May 2016 Suffered a Left humerus fx 10/2014  Previously had trouble on Lipitor, Crestor, vytorin. Reports having muscle ache  Previous CT scan from 01/17/2013  showed mild to moderate descending aortic atherosclerotic plaque   CT [facility] abdomen pelvis with [facility] also documented aortic atherosclerosis   PMH:   has a past medical history of Anemia; Anxiety; Arthritis; Cancer ([place]); Constipation; Dysrhythmia; Environmental allergies; GERD (gastroesophageal reflux disease); History of bladder infections; Hyperlipidemia; Murmur; and Urinary problem.  PSH:    Past Surgical History:  Procedure Laterality Date  . BREAST REDUCTION SURGERY    . FACIAL COSMETIC SURGERY    . LAPAROTOMY N/A 02/24/2017   Procedure: EXPLORATORY LAPAROTOMY and repair small hernia;  Surgeon: Jules Husbands, MD;  Location:  [facility] ORS;  Service: General;  Laterality: N/A;  . ORIF HUMERUS FRACTURE Right 07/08/2015   Procedure: OPEN REDUCTION INTERNAL FIXATION (ORIF) HUMERAL SHAFT FRACTURE with bone graft and allograft;  Surgeon: Justice Britain, MD;  Location: [facility];  Service: Orthopedics;  Laterality: Right;  . SKIN SURGERY     squamous cell    Current Outpatient Prescriptions  Medication Sig Dispense Refill  . ALPRAZolam (XANAX) 0.5 MG tablet TAKE 1/2-1 TABLET BY MOUTH DAILY AS NEEDED 30 tablet 5  . aspirin 81 MG EC tablet Take 81 mg by mouth daily.     . Calcium-Vitamin D (CALTRATE 600 PLUS-VIT D PO) Take 1 tablet by mouth daily.     . cetirizine (ZYRTEC ALLERGY) 10 MG tablet Take 10 mg by mouth daily.    Marland Kitchen diltiazem (CARDIZEM) 30 MG tablet Take 1 tablet (30 mg total) by mouth 3 (three) times daily as needed. (Patient taking differently: Take 30 mg by mouth at bedtime. ) 60 tablet 3  . ezetimibe (ZETIA) 10 MG tablet Take 1 tablet (10 mg total) by mouth daily. 30 tablet 11  . fluticasone (FLONASE) 50 MCG/ACT nasal spray USE TWO PUFFS INTO EACH NOSTRIL DAILY (Patient taking differently: Place 1 spray into both nostrils as needed. USE TWO PUFFS INTO EACH NOSTRIL DAILY) 16 g 5  . POTASSIUM PO Take by mouth daily.    . vitamin B-12 (CYANOCOBALAMIN) 1000 MCG tablet Take 1 tablet (1,000 mcg total) by mouth daily. 30 tablet 0  . vitamin C (ASCORBIC ACID) 500 MG tablet Take 500 mg by mouth daily.     No current facility-administered medications for this visit.  Allergies:   Percocet [oxycodone-acetaminophen] and Vicodin [hydrocodone-acetaminophen]   Social History:  The patient  reports that she quit smoking about 26 years ago. She has a 6.25 pack-year smoking history. She has never used smokeless tobacco. She reports that she drinks about 3.0 oz of alcohol per week . She reports that she does not use drugs.   Family History:   family history includes Diabetes in her brother; Stroke (age of onset: 36) in her mother.     Review of Systems: Review of Systems  Constitutional: Positive for malaise/fatigue.  Respiratory: Negative.   Cardiovascular: Positive for palpitations.  Gastrointestinal: Negative.   Musculoskeletal: Negative.   Neurological: Positive for dizziness and weakness.  Psychiatric/Behavioral: The patient is nervous/anxious.   All other systems reviewed and are negative.    PHYSICAL EXAM: VS:  BP 114/64 (BP Location: Left Arm, Patient Position: Sitting, Cuff Size: Normal)   Pulse 80   Ht 5\' 2"  (1.575 m)   Wt 129 lb 8 oz (58.7 kg)   BMI 23.69 kg/m  , BMI Body mass index is 23.69 kg/m. No significant change in physical exam GEN: Well nourished, well developed, in no acute distress  HEENT: normal  Neck: no JVD, carotid bruits, or masses Cardiac: RRR; no murmurs, rubs, or gallops,no edema  Respiratory:  clear to auscultation bilaterally, normal work of breathing GI: soft, nontender, nondistended, + BS MS: no deformity or atrophy  Skin: warm and dry, no rash Neuro:  Strength and sensation are intact Psych: euthymic mood, full affect    Recent Labs: 03/27/2016: TSH 1.510 02/24/2017: ALT 29 02/27/2017: Magnesium 1.4 02/28/2017: BUN 6; Creatinine, Ser 0.60; Hemoglobin 12.7; Platelets 269; Potassium 4.2; Sodium 140    Lipid Panel Lab Results  Component Value Date   CHOL 233 (H) 03/27/2016   HDL 63 03/27/2016   LDLCALC 145 (H) 03/27/2016   TRIG 124 03/27/2016      Wt Readings from Last 3 Encounters:  03/15/17 129 lb 8 oz (58.7 kg)  03/13/17 130 lb 3.2 oz (59.1 kg)  03/12/17 129 lb (58.5 kg)       ASSESSMENT AND PLAN:  Hypercholesteremia - Plan: EKG 12-Lead tolerating Zetia daily When she is ready recommended she try very low-dose Crestor once or twice a day. She will call us when she is ready  Chest pain, unspecified type - Plan: EKG 12-Lead No recent episodes of chest pain No further workup needed. Symptoms stable  Palpitations - Plan: EKG 12-Lead No recent  episodes of palpitations, no further workup needed  Anxiety Xanax as needed  Aortic atherosclerosis Mild to moderate, seen on previous CT scan We have recommended she take Zetia Consider very low-dose statin, once or twice a week  Frequent PVCs Asymptomatic Likely responsible for palpitations in the past  Abdominal pain Discussed recent surgery with her, inflammation of jejunum Still recovering, still feels weak   Total encounter time more than 25 minutes  Greater than 50% was spent in counseling and coordination of care with the patient   Disposition:   F/U  12 months as needed   Orders Placed This Encounter  Procedures  . EKG 12-Lead     Signed, Esmond Plants, M.D., Ph.D. 03/15/2017  [facility] [place], [place]

## 2017-03-15 ENCOUNTER — Ambulatory Visit (INDEPENDENT_AMBULATORY_CARE_PROVIDER_SITE_OTHER): Payer: PPO | Admitting: Cardiovascular Disease

## 2017-03-15 ENCOUNTER — Encounter: Payer: Self-pay | Admitting: Cardiovascular Disease

## 2017-03-15 VITALS — BP 114/64 | HR 80 | Ht 62.0 in | Wt 129.5 lb

## 2017-03-15 DIAGNOSIS — I493 Ventricular premature depolarization: Secondary | ICD-10-CM

## 2017-03-15 DIAGNOSIS — E7800 Pure hypercholesterolemia, unspecified: Secondary | ICD-10-CM

## 2017-03-15 DIAGNOSIS — R079 Chest pain, unspecified: Secondary | ICD-10-CM | POA: Diagnosis not present

## 2017-03-15 DIAGNOSIS — R1084 Generalized abdominal pain: Secondary | ICD-10-CM

## 2017-03-15 DIAGNOSIS — I700 Atherosclerosis of aorta: Secondary | ICD-10-CM | POA: Diagnosis not present

## 2017-03-15 DIAGNOSIS — R002 Palpitations: Secondary | ICD-10-CM

## 2017-03-15 NOTE — Patient Instructions (Signed)

## 2017-04-05 ENCOUNTER — Other Ambulatory Visit: Payer: Self-pay | Admitting: Physician Assistant

## 2017-04-05 DIAGNOSIS — I10 Essential (primary) hypertension: Secondary | ICD-10-CM

## 2017-07-09 ENCOUNTER — Telehealth: Payer: Self-pay | Admitting: Cardiovascular Disease

## 2017-07-09 NOTE — Telephone Encounter (Signed)
Pt calling stating she has a friend who is taking Pravastatin, she states they are having the same symptoms and that it seems to be working for them  She is calling to see if we can see if she can take this as well.   Please send to [facility]

## 2017-07-09 NOTE — Telephone Encounter (Signed)
Spoke with patient and reviewed that she would need to see someone to discuss changing her medication. She reports that she is not taking the Zetia and has been off for a while now. Reviewed importance of medications and she states that Dr. Rockey Situ was aware. Reviewed chart and I do not see this mentioned and it is still on her medication list. Note entered that she reports not taking it at this time. Offered to get her scheduled for appointment with provider but she declines at this time stating that she is going out of town and will call us back when she returns. She was appreciative for the call with no further questions.

## 2017-07-09 NOTE — Telephone Encounter (Signed)
Please see note below and advise about Pravastatin.

## 2017-07-11 ENCOUNTER — Other Ambulatory Visit: Payer: Self-pay

## 2017-07-11 DIAGNOSIS — F419 Anxiety disorder, unspecified: Secondary | ICD-10-CM

## 2017-07-11 MED ORDER — ALPRAZOLAM 0.5 MG PO TABS
ORAL_TABLET | ORAL | 5 refills | Status: DC
Start: 2017-07-11 — End: 2018-01-29

## 2017-07-11 NOTE — Telephone Encounter (Signed)
Called in to [facility] 

## 2017-07-11 NOTE — Telephone Encounter (Signed)
Please review for refill. Thanks!  

## 2017-07-26 ENCOUNTER — Other Ambulatory Visit: Payer: Self-pay | Admitting: Physician Assistant

## 2017-07-26 DIAGNOSIS — I10 Essential (primary) hypertension: Secondary | ICD-10-CM

## 2017-07-26 NOTE — Telephone Encounter (Signed)
[facility] faxed a refill request for the following medication. Thanks CC  diltiazem (CARDIZEM) 30 MG tablet

## 2017-07-27 MED ORDER — DILTIAZEM HCL 30 MG PO TABS: 30.0000 mg | ORAL_TABLET | Freq: Three times a day (TID) | ORAL | 1 refills | Status: DC | PRN

## 2017-08-01 ENCOUNTER — Telehealth: Payer: Self-pay | Admitting: Cardiovascular Disease

## 2017-08-01 NOTE — Telephone Encounter (Signed)
Pt states on Saturday her BP was high. It was 186/80. States it did eventually come down. States last night she awoke with her heart racing. Pt c/o BP issue: STAT if pt c/o blurred vision, one-sided weakness or slurred speech   1. What are your last 5 BP readings?  2/19 am -136/78, pm 112/66 2/20 -139/74, 132/61, last night 135/71 2/21-148/80   2. Are you having any other symptoms (ex. Dizziness, headache, blurred vision, passed out)?  Saturday, left arm was numb, chin and left ear. States it finally went away  3. What is your BP issue?

## 2017-08-01 NOTE — Telephone Encounter (Signed)
Spoke with patient in regards to symptoms which occurred last Saturday and last evening.  She felt some numbness in her L hand and jaw ( BP 186/87 and HR 86 bpm )  She took her diltiazem 30 mg at 11:30 pm and a second dose 30 mg at 2:30 am after feeling a "pounding in her chest,"  HR 60s.   She feels okay this morning.  She was a little nervous at 5:30 this AM so she took 0.25 mg Xanax.  Please advise.Marland Kitchen

## 2017-08-02 NOTE — Telephone Encounter (Signed)
Left voicemail message to call back  

## 2017-08-02 NOTE — Telephone Encounter (Signed)
Elevated blood pressure with numbness left hand jaw Certainly could be anxiety or panic attack Other blood pressure numbers look fantastic If she feels back to normal, do not think we need to do significant workup Would monitor blood pressure for now I think she did the right thing to take extra diltiazem and Xanax

## 2017-08-03 NOTE — Telephone Encounter (Signed)
Spoke w/ pt.  Advised her of Dr. Donivan Scull recommendation.  She is agreeable and appreciative of the call.  Asked her to call back if BP #s become and stay elevated.

## 2017-08-03 NOTE — Telephone Encounter (Signed)
Pt returning our call °Please call back ° °

## 2017-08-22 ENCOUNTER — Telehealth: Payer: Self-pay | Admitting: Physician Assistant

## 2017-08-22 NOTE — Telephone Encounter (Signed)
Unfortunately she will need an appt for [place] to have an office note to go with referral.

## 2017-08-22 NOTE — Telephone Encounter (Signed)
Ok to refer? Please advise. Thanks!  

## 2017-08-22 NOTE — Telephone Encounter (Signed)
Patient was formerly seen by Dr. Vira Agar in 2013.    She is now having diarrhea after eating certain food and she is wanting to go back to him. They will not see her without [place] referring.

## 2017-08-23 NOTE — Telephone Encounter (Signed)
Noted  

## 2017-08-23 NOTE — Telephone Encounter (Signed)
Pt called to check status of referral request. Pt was advised as below and offered an appt. Pt stated that her insurance doesn't require a referral but Dr. Tiffany Kocher is how requires a referral. I advised pt that referral would require an OV. Pt stated ok and hung up. Please advise. Thanks TNP

## 2017-08-28 ENCOUNTER — Encounter: Payer: Self-pay | Admitting: *Deleted

## 2017-08-28 ENCOUNTER — Other Ambulatory Visit: Payer: Self-pay | Admitting: *Deleted

## 2017-08-28 NOTE — Patient Outreach (Addendum)
[facility]) Care Management  08/28/2017  Isabel Gonzalez Jul 25, 1945 [id number]  Referral via Nurse call center; patient complaint " explosive diarrhea on & off for a month. Five episodes in past 24 hrs. No blood noted, no vomiting or fever."  Telephone call to patient; left HIPPA compliant voice mail requesting return call.  Plan: Geophysicist/field seismologist. Follow up in 3-5 business days.   Addendum: Received return call from patient who was advised of reason for outreach call. Patient gave HIPPA verification.  Patient voices that she has not had further symptoms and thinks that problem occurred  because of the kinds of food she is was eating. States she has gone back to bland diet. Voices that she did call MD office as she was advised and was given appointment. States she is declining appointment currently because she does not want  to go to MD office during flu season.  States she will set up appointment later in the year.  States she called GI office and was advised that she needed to go to primary care provider first so they will have referral information.  Patient advised of importance of primary care follow up . States she understands and will make appointment when flu season is over.    States no further concerns. Nurse call center referral addressed.   Plan: Case closed/send to care management assistant.   Sherrin Daisy, RN BSN [place] Management Coordinator [facility] Care Management  [phone]

## 2017-09-03 ENCOUNTER — Other Ambulatory Visit: Payer: Self-pay | Admitting: *Deleted

## 2017-09-11 ENCOUNTER — Other Ambulatory Visit: Payer: Self-pay | Admitting: Physician Assistant

## 2017-09-11 DIAGNOSIS — J301 Allergic rhinitis due to pollen: Secondary | ICD-10-CM

## 2017-09-11 MED ORDER — FLUTICASONE PROPIONATE 50 MCG/ACT NA SUSP: 1.0000 | NASAL | 3 refills | Status: DC | PRN

## 2017-09-11 NOTE — Telephone Encounter (Signed)
[facility] faxed a refill request for the following medication. Thanks CC  fluticasone (FLONASE) 50 MCG/ACT nasal spray

## 2017-10-11 DIAGNOSIS — H401131 Primary open-angle glaucoma, bilateral, mild stage: Secondary | ICD-10-CM | POA: Diagnosis not present

## 2017-11-20 DIAGNOSIS — H401131 Primary open-angle glaucoma, bilateral, mild stage: Secondary | ICD-10-CM | POA: Diagnosis not present

## 2018-01-29 ENCOUNTER — Other Ambulatory Visit: Payer: Self-pay | Admitting: Physician Assistant

## 2018-01-29 DIAGNOSIS — F419 Anxiety disorder, unspecified: Secondary | ICD-10-CM

## 2018-02-06 ENCOUNTER — Ambulatory Visit (INDEPENDENT_AMBULATORY_CARE_PROVIDER_SITE_OTHER): Payer: PPO | Admitting: Physician Assistant

## 2018-02-06 ENCOUNTER — Other Ambulatory Visit: Payer: Self-pay

## 2018-02-06 ENCOUNTER — Encounter: Payer: Self-pay | Admitting: Physician Assistant

## 2018-02-06 VITALS — BP 130/78 | HR 60 | Temp 97.8°F | Resp 16 | Ht 61.0 in | Wt 133.0 lb

## 2018-02-06 DIAGNOSIS — Z1159 Encounter for screening for other viral diseases: Secondary | ICD-10-CM | POA: Diagnosis not present

## 2018-02-06 DIAGNOSIS — F419 Anxiety disorder, unspecified: Secondary | ICD-10-CM

## 2018-02-06 DIAGNOSIS — E538 Deficiency of other specified B group vitamins: Secondary | ICD-10-CM

## 2018-02-06 DIAGNOSIS — M8589 Other specified disorders of bone density and structure, multiple sites: Secondary | ICD-10-CM

## 2018-02-06 DIAGNOSIS — E7800 Pure hypercholesterolemia, unspecified: Secondary | ICD-10-CM

## 2018-02-06 DIAGNOSIS — Z0000 Encounter for general adult medical examination without abnormal findings: Secondary | ICD-10-CM | POA: Diagnosis not present

## 2018-02-06 DIAGNOSIS — I10 Essential (primary) hypertension: Secondary | ICD-10-CM | POA: Diagnosis not present

## 2018-02-06 DIAGNOSIS — Z780 Asymptomatic menopausal state: Secondary | ICD-10-CM | POA: Diagnosis not present

## 2018-02-06 NOTE — Patient Instructions (Signed)
Health Maintenance for Postmenopausal Women Menopause is a normal process in which your reproductive ability comes to an end. This process happens gradually over a span of months to years, usually between the ages of 22 and 9. Menopause is complete when you have missed 12 consecutive menstrual periods. It is important to talk with your health care provider about some of the most common conditions that affect postmenopausal women, such as heart disease, cancer, and bone loss (osteoporosis). Adopting a healthy lifestyle and getting preventive care can help to promote your health and wellness. Those actions can also lower your chances of developing some of these common conditions. What should I know about menopause? During menopause, you may experience a number of symptoms, such as:  Moderate-to-severe hot flashes.  Night sweats.  Decrease in sex drive.  Mood swings.  Headaches.  Tiredness.  Irritability.  Memory problems.  Insomnia.  Choosing to treat or not to treat menopausal changes is an individual decision that you make with your health care provider. What should I know about hormone replacement therapy and supplements? Hormone therapy products are effective for treating symptoms that are associated with menopause, such as hot flashes and night sweats. Hormone replacement carries certain risks, especially as you become older. If you are thinking about using estrogen or estrogen with progestin treatments, discuss the benefits and risks with your health care provider. What should I know about heart disease and stroke? Heart disease, heart attack, and stroke become more likely as you age. This may be due, in part, to the hormonal changes that your body experiences during menopause. These can affect how your body processes dietary fats, triglycerides, and cholesterol. Heart attack and stroke are both medical emergencies. There are many things that you can do to help prevent heart disease  and stroke:  Have your blood pressure checked at least every 1-2 years. High blood pressure causes heart disease and increases the risk of stroke.  If you are 53-22 years old, ask your health care provider if you should take aspirin to prevent a heart attack or a stroke.  Do not use any tobacco products, including cigarettes, chewing tobacco, or electronic cigarettes. If you need help quitting, ask your health care provider.  It is important to eat a healthy diet and maintain a healthy weight. ? Be sure to include plenty of vegetables, fruits, low-fat dairy products, and lean protein. ? Avoid eating foods that are high in solid fats, added sugars, or salt (sodium).  Get regular exercise. This is one of the most important things that you can do for your health. ? Try to exercise for at least 150 minutes each week. The type of exercise that you do should increase your heart rate and make you sweat. This is known as moderate-intensity exercise. ? Try to do strengthening exercises at least twice each week. Do these in addition to the moderate-intensity exercise.  Know your numbers.Ask your health care provider to check your cholesterol and your blood glucose. Continue to have your blood tested as directed by your health care provider.  What should I know about cancer screening? There are several types of cancer. Take the following steps to reduce your risk and to catch any cancer development as early as possible. Breast Cancer  Practice breast self-awareness. ? This means understanding how your breasts normally appear and feel. ? It also means doing regular breast self-exams. Let your health care provider know about any changes, no matter how small.  If you are 40  or older, have a clinician do a breast exam (clinical breast exam or CBE) every year. Depending on your age, family history, and medical history, it may be recommended that you also have a yearly breast X-ray (mammogram).  If you  have a family history of breast cancer, talk with your health care provider about genetic screening.  If you are at high risk for breast cancer, talk with your health care provider about having an MRI and a mammogram every year.  Breast cancer (BRCA) gene test is recommended for women who have family members with BRCA-related cancers. Results of the assessment will determine the need for genetic counseling and BRCA1 and for BRCA2 testing. BRCA-related cancers include these types: ? Breast. This occurs in males or females. ? Ovarian. ? Tubal. This may also be called fallopian tube cancer. ? Cancer of the abdominal or pelvic lining (peritoneal cancer). ? Prostate. ? Pancreatic.  Cervical, Uterine, and Ovarian Cancer Your health care provider may recommend that you be screened regularly for cancer of the pelvic organs. These include your ovaries, uterus, and vagina. This screening involves a pelvic exam, which includes checking for microscopic changes to the surface of your cervix (Pap test).  For women ages 21-65, health care providers may recommend a pelvic exam and a Pap test every three years. For women ages 79-65, they may recommend the Pap test and pelvic exam, combined with testing for human papilloma virus (HPV), every five years. Some types of HPV increase your risk of cervical cancer. Testing for HPV may also be done on women of any age who have unclear Pap test results.  Other health care providers may not recommend any screening for nonpregnant women who are considered low risk for pelvic cancer and have no symptoms. Ask your health care provider if a screening pelvic exam is right for you.  If you have had past treatment for cervical cancer or a condition that could lead to cancer, you need Pap tests and screening for cancer for at least 20 years after your treatment. If Pap tests have been discontinued for you, your risk factors (such as having a new sexual partner) need to be  reassessed to determine if you should start having screenings again. Some women have medical problems that increase the chance of getting cervical cancer. In these cases, your health care provider may recommend that you have screening and Pap tests more often.  If you have a family history of uterine cancer or ovarian cancer, talk with your health care provider about genetic screening.  If you have vaginal bleeding after reaching menopause, tell your health care provider.  There are currently no reliable tests available to screen for ovarian cancer.  Lung Cancer Lung cancer screening is recommended for adults 69-62 years old who are at high risk for lung cancer because of a history of smoking. A yearly low-dose CT scan of the lungs is recommended if you:  Currently smoke.  Have a history of at least 30 pack-years of smoking and you currently smoke or have quit within the past 15 years. A pack-year is smoking an average of one pack of cigarettes per day for one year.  Yearly screening should:  Continue until it has been 15 years since you quit.  Stop if you develop a health problem that would prevent you from having lung cancer treatment.  Colorectal Cancer  This type of cancer can be detected and can often be prevented.  Routine colorectal cancer screening usually begins at  age 42 and continues through age 45.  If you have risk factors for colon cancer, your health care provider may recommend that you be screened at an earlier age.  If you have a family history of colorectal cancer, talk with your health care provider about genetic screening.  Your health care provider may also recommend using home test kits to check for hidden blood in your stool.  A small camera at the end of a tube can be used to examine your colon directly (sigmoidoscopy or colonoscopy). This is done to check for the earliest forms of colorectal cancer.  Direct examination of the colon should be repeated every  5-10 years until age 71. However, if early forms of precancerous polyps or small growths are found or if you have a family history or genetic risk for colorectal cancer, you may need to be screened more often.  Skin Cancer  Check your skin from head to toe regularly.  Monitor any moles. Be sure to tell your health care provider: ? About any new moles or changes in moles, especially if there is a change in a mole's shape or color. ? If you have a mole that is larger than the size of a pencil eraser.  If any of your family members has a history of skin cancer, especially at a young age, talk with your health care provider about genetic screening.  Always use sunscreen. Apply sunscreen liberally and repeatedly throughout the day.  Whenever you are outside, protect yourself by wearing long sleeves, pants, a wide-brimmed hat, and sunglasses.  What should I know about osteoporosis? Osteoporosis is a condition in which bone destruction happens more quickly than new bone creation. After menopause, you may be at an increased risk for osteoporosis. To help prevent osteoporosis or the bone fractures that can happen because of osteoporosis, the following is recommended:  If you are 46-71 years old, get at least 1,000 mg of calcium and at least 600 mg of vitamin D per day.  If you are older than age 55 but younger than age 65, get at least 1,200 mg of calcium and at least 600 mg of vitamin D per day.  If you are older than age 54, get at least 1,200 mg of calcium and at least 800 mg of vitamin D per day.  Smoking and excessive alcohol intake increase the risk of osteoporosis. Eat foods that are rich in calcium and vitamin D, and do weight-bearing exercises several times each week as directed by your health care provider. What should I know about how menopause affects my mental health? Depression may occur at any age, but it is more common as you become older. Common symptoms of depression  include:  Low or sad mood.  Changes in sleep patterns.  Changes in appetite or eating patterns.  Feeling an overall lack of motivation or enjoyment of activities that you previously enjoyed.  Frequent crying spells.  Talk with your health care provider if you think that you are experiencing depression. What should I know about immunizations? It is important that you get and maintain your immunizations. These include:  Tetanus, diphtheria, and pertussis (Tdap) booster vaccine.  Influenza every year before the flu season begins.  Pneumonia vaccine.  Shingles vaccine.  Your health care provider may also recommend other immunizations. This information is not intended to replace advice given to you by your health care provider. Make sure you discuss any questions you have with your health care provider. Document Released: 07/21/2005  Document Revised: 12/17/2015 Document Reviewed: 03/02/2015 Elsevier Interactive Patient Education  2018 [employer] Inc[employer]  

## 2018-02-06 NOTE — Progress Notes (Signed)
Patient: Isabel Gonzalez, Female    DOB: [birth year], 72 y.o.   MRN: [id number] Visit Date: 02/06/2018  Today's Provider: [name], [name]   Chief Complaint  Patient presents with  . Medicare Wellness   Subjective:    Annual wellness visit Isabel Gonzalez is a 72 y.o. female. She feels well. She reports exercising walking, 60 mins. She reports she is sleeping well.  03/27/16 CPE 04/29/12 colonoscopy-diverticulosis 04/01/15 BMD-osteopenia Mammogram-Pt declined -----------------------------------------------------------   Review of Systems  Constitutional: Negative.   HENT: Positive for voice change.   Eyes: Negative.   Respiratory: Negative.   Cardiovascular: Negative.   Gastrointestinal: Positive for diarrhea.  Endocrine: Positive for polyuria.  Genitourinary: Negative.   Musculoskeletal: Positive for arthralgias.  Skin: Negative.   Allergic/Immunologic: Positive for environmental allergies.  Neurological: Positive for dizziness.  Hematological: Negative.   Psychiatric/Behavioral: Negative.     Social History   Socioeconomic History  . Marital status: Widowed    Spouse name: Not on file  . Number of children: Not on file  . Years of education: Not on file  . Highest education level: Not on file  Occupational History  . Occupation: retired  Scientific laboratory technician  . Financial resource strain: Not on file  . Food insecurity:    Worry: Not on file    Inability: Not on file  . Transportation needs:    Medical: Not on file    Non-medical: Not on file  Tobacco Use  . Smoking status: Former Smoker    Packs/day: 0.25    Years: 25.00    Pack years: 6.25    Last attempt to quit: 06/11/1990    Years since quitting: 27.6  . Smokeless tobacco: Never Used  Substance and Sexual Activity  . Alcohol use: Yes    Alcohol/week: 5.0 standard drinks    Types: 5 Glasses of wine per week  . Drug use: No  . Sexual activity: Not on file  Lifestyle  .  Physical activity:    Days per week: Not on file    Minutes per session: Not on file  . Stress: Not on file  Relationships  . Social connections:    Talks on phone: Not on file    Gets together: Not on file    Attends religious service: Not on file    Active member of club or organization: Not on file    Attends meetings of clubs or organizations: Not on file    Relationship status: Not on file  . Intimate partner violence:    Fear of current or ex partner: Not on file    Emotionally abused: Not on file    Physically abused: Not on file    Forced sexual activity: Not on file  Other Topics Concern  . Not on file  Social History Narrative   Regular exercise          Past Medical History:  Diagnosis Date  . Anemia   . Anxiety   . Arthritis   . Cancer (HCC)    squamous cell  . Constipation   . Dysrhythmia    palpitations   . Environmental allergies   . GERD (gastroesophageal reflux disease)   . History of bladder infections   . Hyperlipidemia   . Murmur   . Urinary problem      Patient Active Problem List   Diagnosis Date Noted  . Aortic atherosclerosis ([place]) 03/14/2017  . Ileus, postoperative ([place])  02/26/2017  . Small bowel obstruction ([place])   . 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 B12 deficiency 03/27/2016  . Humerus fracture 03/19/2015    Class: Status post  . Abdominal pain 03/18/2015  . Allergic rhinitis 03/18/2015  . Anxiety 03/18/2015  . Arthritis 03/18/2015  . Body mass index (BMI) of 23.0-23.9 in adult 03/18/2015  . Bursitis 03/18/2015  . Family history of diabetes mellitus 03/18/2015  . Acid reflux 03/18/2015  . Glaucoma 03/18/2015  . Hypercholesteremia 03/18/2015  . Blood glucose elevated 03/18/2015  . Headache, migraine 03/18/2015  . Awareness of heartbeats 03/18/2015  . Chest pain 01/23/2012  . PALPITATIONS 08/23/2009    Past Surgical History:  Procedure Laterality Date  . BREAST REDUCTION SURGERY    . FACIAL COSMETIC SURGERY    . LAPAROTOMY  N/A 02/24/2017   Procedure: EXPLORATORY LAPAROTOMY and repair small hernia;  Surgeon: Jules Husbands, MD;  Location: [facility];  Service: General;  Laterality: N/A;  . ORIF HUMERUS FRACTURE Right 07/08/2015   Procedure: OPEN REDUCTION INTERNAL FIXATION (ORIF) HUMERAL SHAFT FRACTURE with bone graft and allograft;  Surgeon: Justice Britain, MD;  Location: [facility];  Service: Orthopedics;  Laterality: Right;  . SKIN SURGERY     squamous cell    Her family history includes Diabetes in her brother; Stroke (age of onset: 40) in her mother.      Current Outpatient Medications:  .  ALPRAZolam (XANAX) 0.5 MG tablet, TAKE 1/2-1 TABLET BY MOUTH DAILY AS NEEDED, Disp: 30 tablet, Rfl: 5 .  aspirin 81 MG EC tablet, Take 81 mg by mouth daily. , Disp: , Rfl:  .  Calcium-Vitamin D (CALTRATE 600 PLUS-VIT D PO), Take 1 tablet by mouth daily. , Disp: , Rfl:  .  cetirizine (ZYRTEC ALLERGY) 10 MG tablet, Take 10 mg by mouth daily., Disp: , Rfl:  .  diltiazem (CARDIZEM) 30 MG tablet, Take 1 tablet (30 mg total) by mouth 3 (three) times daily as needed., Disp: 180 tablet, Rfl: 1 .  fluticasone (FLONASE) 50 MCG/ACT nasal spray, Place 1 spray into both nostrils as needed. USE TWO PUFFS INTO EACH NOSTRIL DAILY, Disp: 16 g, Rfl: 3 .  Ginger, Zingiber officinalis, (GINGER ROOT PO), Take by mouth., Disp: , Rfl:  .  Latanoprost 0.005 % EMUL, Apply 1 drop to eye daily., Disp: , Rfl:  .  Magnesium 500 MG CAPS, Take by mouth., Disp: , Rfl:  .  POTASSIUM PO, Take by mouth daily., Disp: , Rfl:  .  vitamin B-12 (CYANOCOBALAMIN) 1000 MCG tablet, Take 1 tablet (1,000 mcg total) by mouth daily., Disp: 30 tablet, Rfl: 0 .  vitamin C (ASCORBIC ACID) 500 MG tablet, Take 500 mg by mouth daily., Disp: , Rfl:   Patient Care Team: [name], [name] as PCP - General (Family Medicine) Minna Merritts, MD as Consulting Physician (Cardiology) Oneta Rack, MD as Consulting Physician (Dermatology)     Objective:   Vitals: BP  130/78 (BP Location: Left Arm, Patient Position: Sitting, Cuff Size: Normal)   Pulse 60   Temp 97.8 F (36.6 C) (Oral)   Resp 16   Ht 5\' 1"  (1.549 m)   Wt 133 lb (60.3 kg)   SpO2 99%   BMI 25.13 kg/m   Physical Exam  Constitutional: She is oriented to person, place, and time. She appears well-developed and well-nourished. No distress.  HENT:  Head: Normocephalic and atraumatic.  Right Ear: Hearing, tympanic membrane, external ear and ear canal normal.  Left Ear:  Hearing, tympanic membrane, external ear and ear canal normal.  Nose: Nose normal.  Mouth/Throat: Uvula is midline, oropharynx is clear and moist and mucous membranes are normal. No oropharyngeal exudate.  Eyes: Pupils are equal, round, and reactive to light. Conjunctivae and EOM are normal. Right eye exhibits no discharge. Left eye exhibits no discharge. No scleral icterus.  Neck: Normal range of motion. Neck supple. No JVD present. Carotid bruit is not present. No tracheal deviation present. No thyromegaly present.  Cardiovascular: Normal rate, regular rhythm, normal heart sounds and intact distal pulses. Exam reveals no gallop and no friction rub.  No murmur heard. Pulmonary/Chest: Effort normal and breath sounds normal. No respiratory distress. She has no wheezes. She has no rales. She exhibits no tenderness. Right breast exhibits no inverted nipple, no mass, no nipple discharge, no skin change and no tenderness. Left breast exhibits no inverted nipple, no mass, no nipple discharge, no skin change and no tenderness. No breast swelling, tenderness, discharge or bleeding. Breasts are symmetrical.  Abdominal: Soft. Bowel sounds are normal. She exhibits no distension and no mass. There is no tenderness. There is no rebound and no guarding.  Musculoskeletal: Normal range of motion. She exhibits no edema or tenderness.  Lymphadenopathy:    She has no cervical adenopathy.  Neurological: She is alert and oriented to person, place, and  time.  Skin: Skin is warm and dry. No rash noted. She is not diaphoretic.  Psychiatric: She has a normal mood and affect. Her behavior is normal. Judgment and thought content normal.  Vitals reviewed.   Activities of Daily Living In your present state of health, do you have any difficulty performing the following activities: 02/06/2018 03/13/2017  Hearing? N N  Vision? Y N  Difficulty concentrating or making decisions? N N  Walking or climbing stairs? N N  Dressing or bathing? N N  Doing errands, shopping? N N  Preparing Food and eating ? - N  Using the Toilet? - N  In the past six months, have you accidently leaked urine? - N  Do you have problems with loss of bowel control? - N  Managing your Medications? - N  Managing your Finances? - N  Housekeeping or managing your Housekeeping? - N  Some recent data might be hidden    Fall Risk Assessment Fall Risk  02/06/2018 03/13/2017 03/27/2016 03/19/2015  Falls in the past year? No No No Yes  Number falls in past yr: - - - 1  Injury with Fall? - - - Yes     Depression Screen PHQ 2/9 Scores 02/06/2018 03/13/2017 03/13/2017 03/27/2016  PHQ - 2 Score 0 0 0 0  PHQ- 9 Score 0 2 - -    Cognitive Testing - 6-CIT  Correct? Score   What year is it? yes 0 0 or 4  What month is it? yes 0 0 or 3  Memorize:    Pia Mau,  41,  [place],      What time is it? (within 1 hour) yes 0 0 or 3  Count backwards from 20 yes 0 0, 2, or 4  Name the months of the year yes 0 0, 2, or 4  Repeat name & address above yes 2 0, 2, 4, 6, 8, or 10       TOTAL SCORE  2/28   Interpretation:  Normal  Normal (0-7) Abnormal (8-28)     Assessment & Plan:     Annual Wellness Visit  Reviewed patient's Family Medical History Reviewed and updated list of patient's medical providers Assessment of cognitive impairment was done Assessed patient's functional ability Established a written schedule for health screening [place]  Completed and Reviewed  Exercise Activities and Dietary recommendations Goals    . Exercise 150 minutes per week (moderate activity)       Immunization History  Administered Date(s) Administered  . Influenza Split 03/29/2009  . Pneumococcal Conjugate-13 12/10/2013  . Pneumococcal Polysaccharide-23 06/02/2010  . Td 06/29/2003  . Zoster 04/18/2006  . Zoster Recombinat (Shingrix) 09/07/2017    Health Maintenance  Topic Date Due  . Hepatitis C Screening  [birth year]  . MAMMOGRAM  08/03/1994  . TETANUS/TDAP  06/28/2013  . INFLUENZA VACCINE  01/10/2018  . COLONOSCOPY  04/29/2022  . DEXA SCAN  Completed  . PNA vac Low Risk Adult  Completed     Discussed health benefits of physical activity, and encouraged her to engage in regular exercise appropriate for her age and condition.    1. Medicare annual wellness visit, subsequent Normal exam today.  2. Essential hypertension Stable. Continue Diltiazem 30mg  TID. Will check labs as below and f/u pending results. - CBC with Differential/Platelet - Comprehensive metabolic panel - Lipid Panel With LDL/HDL Ratio - TSH  3. B12 deficiency H/O this. On OTC supplementation. Will check labs as below and f/u pending results. - CBC with Differential/Platelet - Comprehensive metabolic panel - Lipid Panel With LDL/HDL Ratio - TSH - Vitamin B12  4. Hypercholesteremia Diet controlled. Intolerant to statins. Will check labs as below and f/u pending results. - CBC with Differential/Platelet - Comprehensive metabolic panel - Lipid Panel With LDL/HDL Ratio - TSH  5. Anxiety Stable Continue alprazolam 0.5mg  prn. Will check labs as below and f/u pending results. - CBC with Differential/Platelet - Comprehensive metabolic panel - Lipid Panel With LDL/HDL Ratio - TSH  6. Osteopenia of multiple sites Last BMD was in 2016 showed osteopenia. Will repeat BMD this year.  - DG Bone Density; Future  7. Postmenopausal estrogen deficiency See  above medical treatment plan. - DG Bone Density; Future  8. Encounter for hepatitis C screening test for low risk patient - Hepatitis C antibody  ------------------------------------------------------------------------------------------------------------    [name], [name]  [facility]

## 2018-02-07 ENCOUNTER — Telehealth: Payer: Self-pay

## 2018-02-07 LAB — COMPREHENSIVE METABOLIC PANEL
A/G RATIO: 1.7 (ref 1.2–2.2)
ALBUMIN: 4.5 g/dL (ref 3.5–4.8)
ALK PHOS: 94 IU/L (ref 39–117)
ALT: 23 IU/L (ref 0–32)
AST: 23 IU/L (ref 0–40)
BILIRUBIN TOTAL: 0.7 mg/dL (ref 0.0–1.2)
BUN/Creatinine Ratio: 20 (ref 12–28)
BUN: 12 mg/dL (ref 8–27)
CHLORIDE: 101 mmol/L (ref 96–106)
CO2: 25 mmol/L (ref 20–29)
Calcium: 9.8 mg/dL (ref 8.7–10.3)
Creatinine, Ser: 0.6 mg/dL (ref 0.57–1.00)
GFR calc Af Amer: 105 mL/min/{1.73_m2} (ref >59)
GFR calc non Af Amer: 91 mL/min/{1.73_m2} (ref >59)
GLOBULIN, TOTAL: 2.6 g/dL (ref 1.5–4.5)
Glucose: 87 mg/dL (ref 65–99)
POTASSIUM: 4 mmol/L (ref 3.5–5.2)
SODIUM: 140 mmol/L (ref 134–144)
Total Protein: 7.1 g/dL (ref 6.0–8.5)

## 2018-02-07 LAB — CBC WITH DIFFERENTIAL/PLATELET
BASOS ABS: 0.1 10*3/uL (ref 0.0–0.2)
BASOS: 1 %
EOS (ABSOLUTE): 0.2 10*3/uL (ref 0.0–0.4)
Eos: 2 %
HEMATOCRIT: 42.3 % (ref 34.0–46.6)
Hemoglobin: 14.4 g/dL (ref 11.1–15.9)
IMMATURE GRANS (ABS): 0 10*3/uL (ref 0.0–0.1)
IMMATURE GRANULOCYTES: 0 %
LYMPHS: 24 %
Lymphocytes Absolute: 1.9 10*3/uL (ref 0.7–3.1)
MCH: 31.3 pg (ref 26.6–33.0)
MCHC: 34 g/dL (ref 31.5–35.7)
MCV: 92 fL (ref 79–97)
Monocytes Absolute: 0.6 10*3/uL (ref 0.1–0.9)
Monocytes: 7 %
NEUTROS PCT: 66 %
Neutrophils Absolute: 5.2 10*3/uL (ref 1.4–7.0)
Platelets: 338 10*3/uL (ref 150–450)
RBC: 4.6 x10E6/uL (ref 3.77–5.28)
RDW: 12.4 % (ref 12.3–15.4)
WBC: 7.8 10*3/uL (ref 3.4–10.8)

## 2018-02-07 LAB — LIPID PANEL WITH LDL/HDL RATIO
CHOLESTEROL TOTAL: 248 mg/dL — AB (ref 100–199)
HDL: 59 mg/dL (ref >39)
LDL Calculated: 130 mg/dL — ABNORMAL HIGH (ref 0–99)
LDl/HDL Ratio: 2.2 ratio (ref 0.0–3.2)
TRIGLYCERIDES: 293 mg/dL — AB (ref 0–149)
VLDL Cholesterol Cal: 59 mg/dL — ABNORMAL HIGH (ref 5–40)

## 2018-02-07 LAB — TSH: TSH: 1.25 u[IU]/mL (ref 0.450–4.500)

## 2018-02-07 LAB — VITAMIN B12: VITAMIN B 12: 1772 pg/mL — AB (ref 232–1245)

## 2018-02-07 LAB — HEPATITIS C ANTIBODY

## 2018-02-07 NOTE — Telephone Encounter (Signed)
-----   Message from [name], [place] sent at 02/07/2018  8:36 AM EDT ----- Cholesterol is ok since it wasn't a true fasting lab. It appears stable from last year. Triglycerides are falsely elevated secondary to non-fasting. Blood count is normal Kidney and liver function are normal. Sugar is normal. Thyroid is normal. B12 is normal. Hep C negative.

## 2018-02-07 NOTE — Telephone Encounter (Signed)
Patient advised as directed below.  Thanks,  -Sahid Borba 

## 2018-02-19 DIAGNOSIS — H401131 Primary open-angle glaucoma, bilateral, mild stage: Secondary | ICD-10-CM | POA: Diagnosis not present

## 2018-02-27 ENCOUNTER — Other Ambulatory Visit: Payer: PPO

## 2018-03-08 ENCOUNTER — Ambulatory Visit (INDEPENDENT_AMBULATORY_CARE_PROVIDER_SITE_OTHER): Payer: PPO | Admitting: Family Medicine

## 2018-03-08 ENCOUNTER — Encounter: Payer: Self-pay | Admitting: Family Medicine

## 2018-03-08 VITALS — BP 128/76 | HR 59 | Temp 97.4°F | Wt 135.6 lb

## 2018-03-08 DIAGNOSIS — N3090 Cystitis, unspecified without hematuria: Secondary | ICD-10-CM | POA: Diagnosis not present

## 2018-03-08 LAB — POCT URINALYSIS DIPSTICK
Bilirubin, UA: NEGATIVE
Blood, UA: NEGATIVE
Glucose, UA: NEGATIVE
KETONES UA: NEGATIVE
NITRITE UA: NEGATIVE
PH UA: 7 (ref 5.0–8.0)
PROTEIN UA: NEGATIVE
Spec Grav, UA: 1.01 (ref 1.010–1.025)
Urobilinogen, UA: 0.2 E.U./dL

## 2018-03-08 MED ORDER — CEPHALEXIN 500 MG PO CAPS: 500.0000 mg | ORAL_CAPSULE | Freq: Two times a day (BID) | ORAL | 0 refills | Status: AC

## 2018-03-08 NOTE — Patient Instructions (Signed)

## 2018-03-08 NOTE — Progress Notes (Signed)
Patient: Isabel Gonzalez Female    DOB: 08-08-1945   72 y.o.   MRN: [id number] Visit Date: 03/08/2018  Today's Provider: Lavon Paganini, MD   Chief Complaint  Patient presents with  . Dysuria   Subjective:    I, Tiburcio Pea, CMA, am acting as a scribe for Lavon Paganini, MD.   Dysuria   This is a new problem. Episode onset: 3 weeks ago. The problem occurs intermittently. The problem has been unchanged. The quality of the pain is described as aching. There has been no fever. Associated symptoms include frequency, hesitancy and sweats. Pertinent negatives include no chills, discharge, hematuria, nausea or urgency. Treatments tried: Azo. The treatment provided no relief. Her past medical history is significant for recurrent UTIs (several years ago).     Allergies  Allergen Reactions  . Percocet [Oxycodone-Acetaminophen] Nausea And Vomiting  . Vicodin [Hydrocodone-Acetaminophen] Nausea And Vomiting     Current Outpatient Medications:  .  ALPRAZolam (XANAX) 0.5 MG tablet, TAKE 1/2-1 TABLET BY MOUTH DAILY AS NEEDED, Disp: 30 tablet, Rfl: 5 .  aspirin 81 MG EC tablet, Take 81 mg by mouth daily. , Disp: , Rfl:  .  Calcium-Vitamin D (CALTRATE 600 PLUS-VIT D PO), Take 1 tablet by mouth daily. , Disp: , Rfl:  .  cetirizine (ZYRTEC ALLERGY) 10 MG tablet, Take 10 mg by mouth daily., Disp: , Rfl:  .  diltiazem (CARDIZEM) 30 MG tablet, Take 1 tablet (30 mg total) by mouth 3 (three) times daily as needed., Disp: 180 tablet, Rfl: 1 .  fluticasone (FLONASE) 50 MCG/ACT nasal spray, Place 1 spray into both nostrils as needed. USE TWO PUFFS INTO EACH NOSTRIL DAILY, Disp: 16 g, Rfl: 3 .  Ginger, Zingiber officinalis, (GINGER ROOT PO), Take by mouth., Disp: , Rfl:  .  Latanoprost 0.005 % EMUL, Apply 1 drop to eye daily., Disp: , Rfl:  .  Magnesium 500 MG CAPS, Take by mouth., Disp: , Rfl:  .  POTASSIUM PO, Take by mouth daily., Disp: , Rfl:  .  vitamin B-12 (CYANOCOBALAMIN) 1000 MCG  tablet, Take 1 tablet (1,000 mcg total) by mouth daily., Disp: 30 tablet, Rfl: 0 .  vitamin C (ASCORBIC ACID) 500 MG tablet, Take 500 mg by mouth daily., Disp: , Rfl:   Review of Systems  Constitutional: Negative.  Negative for chills.  Respiratory: Negative.   Cardiovascular: Negative.   Gastrointestinal: Negative for nausea.  Genitourinary: Positive for dysuria, frequency and hesitancy. Negative for hematuria and urgency.  Musculoskeletal: Negative.     Social History   Tobacco Use  . Smoking status: Former Smoker    Packs/day: 0.25    Years: 25.00    Pack years: 6.25    Last attempt to quit: 06/11/1990    Years since quitting: 27.7  . Smokeless tobacco: Never Used  Substance Use Topics  . Alcohol use: Yes    Alcohol/week: 5.0 standard drinks    Types: 5 Glasses of wine per week   Objective:   BP 128/76 (BP Location: Left Arm, Patient Position: Sitting, Cuff Size: Normal)   Pulse (!) 59   Temp (!) 97.4 F (36.3 C) (Oral)   Wt 135 lb 9.6 oz (61.5 kg)   SpO2 99%   BMI 25.62 kg/m  Vitals:   03/08/18 0943  BP: 128/76  Pulse: (!) 59  Temp: (!) 97.4 F (36.3 C)  TempSrc: Oral  SpO2: 99%  Weight: 135 lb 9.6 oz (61.5 kg)  Physical Exam  Constitutional: She is oriented to person, place, and time. She appears well-developed and well-nourished. No distress.  HENT:  Head: Normocephalic and atraumatic.  Eyes: Conjunctivae are normal. No scleral icterus.  Cardiovascular: Normal rate, regular rhythm, normal heart sounds and intact distal pulses.  No murmur heard. Pulmonary/Chest: Effort normal and breath sounds normal. No respiratory distress. She has no wheezes. She has no rales.  Abdominal: Soft. Bowel sounds are normal. She exhibits no distension. There is no tenderness. There is no CVA tenderness.  Musculoskeletal: She exhibits no edema.  Neurological: She is alert and oriented to person, place, and time.  Skin: Skin is warm and dry. Capillary refill takes less  than 2 seconds. No rash noted.  Psychiatric: She has a normal mood and affect. Her behavior is normal.  Vitals reviewed.   Results for orders placed or performed in visit on 03/08/18  POCT Urinalysis Dipstick  Result Value Ref Range   Color, UA dark yellow    Clarity, UA hazy    Glucose, UA Negative Negative   Bilirubin, UA negative    Ketones, UA negative    Spec Grav, UA 1.010 1.010 - 1.025   Blood, UA negative    pH, UA 7.0 5.0 - 8.0   Protein, UA Negative Negative   Urobilinogen, UA 0.2 0.2 or 1.0 E.U./dL   Nitrite, UA negative    Leukocytes, UA Moderate (2+) (A) Negative   Appearance     Odor         Assessment & Plan:   1. Cystitis - UA and symptoms c/w UTI - no hematuria - will send culture for sensitivities  - no signs of systemic illness - will treat with Keflex x5d - return precautions discussed - POCT Urinalysis Dipstick - Urine Culture    Meds ordered this encounter  Medications  . cephALEXin (KEFLEX) 500 MG capsule    Sig: Take 1 capsule (500 mg total) by mouth 2 (two) times daily for 5 days.    Dispense:  10 capsule    Refill:  0     Return if symptoms worsen or fail to improve.   The entirety of the information documented in the History of Present Illness, Review of Systems and Physical Exam were personally obtained by me. Portions of this information were initially documented by Tiburcio Pea, CMA and reviewed by me for thoroughness and accuracy.    Virginia Crews, MD, MPH [facility] 03/08/2018 10:11 AM

## 2018-03-10 LAB — URINE CULTURE

## 2018-03-11 ENCOUNTER — Telehealth: Payer: Self-pay

## 2018-03-11 NOTE — Telephone Encounter (Signed)
Attempted to contact patient. No answer or voicemail.  

## 2018-03-11 NOTE — Telephone Encounter (Signed)
-----   Message from Carmon Ginsberg, [place] sent at 03/11/2018  4:06 PM EDT ----- She has a strep organism. Is she improving on the cephalexin?

## 2018-03-13 NOTE — Telephone Encounter (Signed)
Tried calling; no answer.   Thanks,   -Laura  

## 2018-03-14 ENCOUNTER — Telehealth: Payer: Self-pay

## 2018-03-14 ENCOUNTER — Ambulatory Visit
Admission: RE | Admit: 2018-03-14 | Discharge: 2018-03-14 | Disposition: A | Discharge status: Home / Self Care | Payer: PPO | Source: Ambulatory Visit | Attending: Physician Assistant | Admitting: Physician Assistant

## 2018-03-14 DIAGNOSIS — Z780 Asymptomatic menopausal state: Secondary | ICD-10-CM | POA: Diagnosis not present

## 2018-03-14 DIAGNOSIS — M8589 Other specified disorders of bone density and structure, multiple sites: Secondary | ICD-10-CM

## 2018-03-14 NOTE — Telephone Encounter (Signed)
Patient advised as directed below. 

## 2018-03-14 NOTE — Telephone Encounter (Signed)
-----   Message from [name], [place] sent at 03/14/2018  3:14 PM EDT ----- Bone density is only slightly decreased from last check (t score was -2.3 and now is -2.4). Would recommend to continue Vit D and calcium as well as weight bearing exercises such as walking and yoga. We will recheck in 2 years.

## 2018-03-15 ENCOUNTER — Telehealth: Payer: Self-pay | Admitting: Physician Assistant

## 2018-03-15 DIAGNOSIS — N3000 Acute cystitis without hematuria: Secondary | ICD-10-CM

## 2018-03-15 MED ORDER — AMOXICILLIN 500 MG PO CAPS: 500.0000 mg | ORAL_CAPSULE | Freq: Three times a day (TID) | ORAL | 0 refills | Status: DC

## 2018-03-15 NOTE — Telephone Encounter (Signed)
Will send in amoxil

## 2018-03-15 NOTE — Telephone Encounter (Signed)
Patient advised she states that she has had much improvement on Keflex. KW

## 2018-03-15 NOTE — Telephone Encounter (Signed)
Pt was doing better with UTI.  Pt finished medication on Wed.  Today she is feeling pressure again and frequent urination.  Please advise.  Thanks, [employer]

## 2018-03-15 NOTE — Telephone Encounter (Signed)
No answer

## 2018-03-15 NOTE — Telephone Encounter (Signed)
Patient advised.

## 2018-03-20 DIAGNOSIS — H401131 Primary open-angle glaucoma, bilateral, mild stage: Secondary | ICD-10-CM | POA: Diagnosis not present

## 2018-05-29 DIAGNOSIS — H16011 Central corneal ulcer, right eye: Secondary | ICD-10-CM | POA: Diagnosis not present

## 2018-06-24 ENCOUNTER — Telehealth: Payer: Self-pay | Admitting: Physician Assistant

## 2018-06-24 DIAGNOSIS — Z20828 Contact with and (suspected) exposure to other viral communicable diseases: Secondary | ICD-10-CM

## 2018-06-24 DIAGNOSIS — T753XXA Motion sickness, initial encounter: Secondary | ICD-10-CM

## 2018-06-24 NOTE — Telephone Encounter (Signed)
lmtcb need clarification why patient needs prescription for Tamiflu. KW

## 2018-06-24 NOTE — Telephone Encounter (Signed)
Pt is calling for tamaflu and motion sickness medication.  Pt is going on a cruse at the end of the week and asking for this to be called into:   [place], [place] - [place] [phone] (Phone) [phone] (Fax)   Thanks, [employer]

## 2018-06-25 NOTE — Telephone Encounter (Signed)
Pt returned missed call.  Please call pt back.  Has an appt at 11 am today.  Thanks, [employer]

## 2018-06-25 NOTE — Telephone Encounter (Signed)
No answer

## 2018-06-26 MED ORDER — OSELTAMIVIR PHOSPHATE 75 MG PO CAPS: 75.0000 mg | ORAL_CAPSULE | Freq: Every day | ORAL | 0 refills | Status: DC

## 2018-06-26 MED ORDER — SCOPOLAMINE 1 MG/3DAYS TD PT72: 1.0000 | MEDICATED_PATCH | TRANSDERMAL | 0 refills | Status: DC

## 2018-06-26 NOTE — Telephone Encounter (Signed)
Pt states she is going on a cruse for two weeks and would like something for motion sickness sent it.  Pt also states she takes Tamiflu as well "Just in case"  She has it on hand but she says it is over 74 years old.  Pt uses [facility].   Please advise.   Thanks,   -Mickel Baas

## 2018-06-26 NOTE — Telephone Encounter (Signed)
She wanted the patches (I'm sorry I didn't add that to the message)     Thanks,   -Mickel Baas

## 2018-06-26 NOTE — Telephone Encounter (Signed)
No answer. LMTCB

## 2018-06-26 NOTE — Telephone Encounter (Signed)
Does she want the scopolamine patches? If not she can get dramamine or bonine for motion sickness OTC.   [facility] scopolamine and [facility]iflu to [facility]

## 2018-08-06 ENCOUNTER — Other Ambulatory Visit: Payer: Self-pay | Admitting: Physician Assistant

## 2018-08-06 DIAGNOSIS — F419 Anxiety disorder, unspecified: Secondary | ICD-10-CM

## 2018-08-06 DIAGNOSIS — I10 Essential (primary) hypertension: Secondary | ICD-10-CM

## 2018-08-07 DIAGNOSIS — H401131 Primary open-angle glaucoma, bilateral, mild stage: Secondary | ICD-10-CM | POA: Diagnosis not present

## 2018-08-08 DIAGNOSIS — R69 Illness, unspecified: Secondary | ICD-10-CM | POA: Diagnosis not present

## 2018-08-15 DIAGNOSIS — D485 Neoplasm of uncertain behavior of skin: Secondary | ICD-10-CM | POA: Diagnosis not present

## 2018-08-15 DIAGNOSIS — L570 Actinic keratosis: Secondary | ICD-10-CM | POA: Diagnosis not present

## 2018-08-15 DIAGNOSIS — L308 Other specified dermatitis: Secondary | ICD-10-CM | POA: Diagnosis not present

## 2018-08-15 DIAGNOSIS — X32XXXA Exposure to sunlight, initial encounter: Secondary | ICD-10-CM | POA: Diagnosis not present

## 2018-09-10 ENCOUNTER — Other Ambulatory Visit: Payer: Self-pay | Admitting: *Deleted

## 2018-09-10 DIAGNOSIS — J301 Allergic rhinitis due to pollen: Secondary | ICD-10-CM

## 2018-09-10 MED ORDER — FLUTICASONE PROPIONATE 50 MCG/ACT NA SUSP: 1.0000 | NASAL | 3 refills | Status: DC | PRN

## 2018-10-16 DIAGNOSIS — H401131 Primary open-angle glaucoma, bilateral, mild stage: Secondary | ICD-10-CM | POA: Diagnosis not present

## 2018-10-16 DIAGNOSIS — H524 Presbyopia: Secondary | ICD-10-CM | POA: Diagnosis not present

## 2018-10-16 DIAGNOSIS — H2513 Age-related nuclear cataract, bilateral: Secondary | ICD-10-CM | POA: Diagnosis not present

## 2018-10-19 DIAGNOSIS — R200 Anesthesia of skin: Secondary | ICD-10-CM | POA: Diagnosis not present

## 2018-10-19 DIAGNOSIS — I493 Ventricular premature depolarization: Secondary | ICD-10-CM | POA: Diagnosis not present

## 2018-10-19 DIAGNOSIS — R202 Paresthesia of skin: Secondary | ICD-10-CM | POA: Diagnosis not present

## 2018-10-21 ENCOUNTER — Telehealth: Payer: Self-pay | Admitting: Cardiovascular Disease

## 2018-10-21 ENCOUNTER — Ambulatory Visit (INDEPENDENT_AMBULATORY_CARE_PROVIDER_SITE_OTHER): Payer: PPO

## 2018-10-21 DIAGNOSIS — I10 Essential (primary) hypertension: Secondary | ICD-10-CM | POA: Diagnosis not present

## 2018-10-21 DIAGNOSIS — E7800 Pure hypercholesterolemia, unspecified: Secondary | ICD-10-CM | POA: Diagnosis not present

## 2018-10-21 NOTE — Telephone Encounter (Signed)
Patient calling Requesting that we request records from [facility] to review and decide what care is needed Working on record request

## 2018-10-21 NOTE — Chronic Care Management (AMB) (Signed)
  Chronic Care Management   Note  10/21/2018 Name: Staisha Winiarski MRN: [id number] DOB: [birth year]   Care Coordination: Natoya Viscomi is a 73 year old female who sees Fenton Malling, [place] for primary care. Ms. Pierro was referred to CCM RN CM by [facility] secondary to patient called [facility] nurse line over the weekend to discuss symptoms of facial numbness and arm tingling. Patient was advised to seek emergency care. Patient presented to ED in [place], [place] as this is where she spends most of her time. Patient has a history of but not limited to hypercholesteremia, frequent PVCs, and Aortic atherosclerosis. Patient's last office visit was 03/08/2018. Telephone outreach to patient today to introduce CCM services and follow up on nurseline call.  Ms. Kohan states she did present to the ED and multiple tests were performed. Ms. Scarlett states she was told there was nothing acutely wrong but she should follow up with outpatient testing such as Echo, Carotid Ultrasound, MRI, and Holter Monitor. Ms. Cerveny states all of her symptoms subsided while in the ED and have not returned.   Today she is questioning if she should return to [facility] to have testing done or if she should have it done in [place]. She states she spends more time in [place] than she does in [place] but all of her medical providers are in [place]. CCM RN CM recommends patient consult her Cardiologist Dr. Rockey Situ to assist her with making these medical decisions. Per EMR she has called Dr. Gwenyth Ober office and was advised to provide office with ED medical records prior to them scheduling a virtual visit. Per Ms. Panek, she was provided all copies of testing results and this was confimed today as patient was able to read results to CCM RN CM of Head CT, blood chemistries, and EKGs.  Today, patient was provided with s/s of heart attack and stroke  SDOH (Social Determinants of Health) screening performed  today. See Care Plan Entry related to challenges with: None  Ms. Odekirk was given information about Chronic Care Management services today including:  1. CCM service includes personalized support from designated clinical staff supervised by her physician, including individualized plan of care and coordination with other care providers 2. 24/7 contact phone numbers for assistance for urgent and routine care needs. 3. Service will only be billed when office clinical staff spend 20 minutes or more in a month to coordinate care. 4. Only one practitioner may furnish and bill the service in a calendar month. 5. The patient may stop CCM services at any time (effective at the end of the month) by phone call to the office staff. 6. The patient will be responsible for cost sharing (co-pay) of up to 20% of the service fee (after annual deductible is met).  Patient agreed to services and verbal consent obtained.      Plan: Patient will:   Return to [facility] and provided Dr. Donivan Scull office with       copies of ED records  Schedule virtual visit with Dr. Rockey Situ to disucuss recent ED visit, symptoms and to establish a plan of action.  Present to ED if symptoms reoccur, worsen or new s/s of stroke or heart attack occur.  RN CM will:  Follow up with patient in two weeks   Will follow up with patient in 2 weeks Voyd Groft E. Rollene Rotunda, RN, BSN Nurse Care Coordinator [facility] Care Management [phone]

## 2018-10-21 NOTE — Patient Instructions (Signed)
1. Thank You for allowing the CCM (Chronic Care Management) Team to assist you with your healthcare goals!!  2. Please go to the ED if you experience any s/s of heart attack or stroke including:  What are the signs or symptoms of a Heart Attack? Symptoms of this condition include:  Chest pain. It may feel like: ? Crushing or squeezing. ? Tightness, pressure, fullness, or heaviness.  Pain in the arm, neck, jaw, back, or upper body.  Shortness of breath.  Heartburn.  Indigestion.  Nausea.  Cold sweats.  Feeling tired.  Sudden lightheadedness.  What are the signs and symptoms of a Stroke? Get help right away if:  You have any symptoms of stroke. "BE FAST" is an easy way to remember the main warning signs: ? B - Balance. Signs are dizziness, sudden trouble walking, or loss of balance. ? E - Eyes. Signs are trouble seeing or a sudden change in how you see. ? F - Face. Signs are sudden weakness or loss of feeling of the face, or the face or eyelid drooping on one side. ? A - Arms. Signs are weakness or loss of feeling in an arm. This happens suddenly and usually on one side of the body. ? S - Speech. Signs are sudden trouble speaking, slurred speech, or trouble understanding what people say. ? T - Time. Time to call emergency services. Write down what time symptoms started.  You have other signs of stroke, such as: ? A sudden, very bad headache with no known cause. ? Feeling sick to your stomach (nausea). ? Throwing up (vomiting). ? Jerky movements you cannot control (seizure).  3.  Please take your ED records to Dr. Donivan Scull office and schedule a virtual visit with him as soon as possible  CCM (Chronic Care Management) Team   Trish Fountain RN, BSN Nurse Care Coordinator  [phone]  Ruben Reason PharmD  Clinical Pharmacist  [phone]   Elliot Gurney, LCSW Clinical Social Worker [phone]  [name] was given information about Chronic Care  Management services today including:  1. CCM service includes personalized support from designated clinical staff supervised by her physician, including individualized plan of care and coordination with other care providers 2. 24/7 contact phone numbers for assistance for urgent and routine care needs. 3. Service will only be billed when office clinical staff spend 20 minutes or more in a month to coordinate care. 4. Only one practitioner may furnish and bill the service in a calendar month. 5. The patient may stop CCM services at any time (effective at the end of the month) by phone call to the office staff. 6. The patient will be responsible for cost sharing (co-pay) of up to 20% of the service fee (after annual deductible is met).  Patient agreed to services and verbal consent obtained.

## 2018-10-24 ENCOUNTER — Telehealth: Payer: Self-pay

## 2018-10-24 NOTE — Telephone Encounter (Signed)

## 2018-10-27 NOTE — Progress Notes (Signed)
Virtual Visit via Video Note   This visit type was conducted due to national recommendations for restrictions regarding the COVID-19 Pandemic (e.g. social distancing) in an effort to limit this patient's exposure and mitigate transmission in our community.  Due to her co-morbid illnesses, this patient is at least at moderate risk for complications without adequate follow up.  This format is felt to be most appropriate for this patient at this time.  All issues noted in this document were discussed and addressed.  A limited physical exam was performed with this format.  Please refer to the patient's chart for her consent to telehealth for [facility].   I connected with  Isabel Gonzalez on 10/28/18 by a video enabled telemedicine application and verified that I am speaking with the correct person using two identifiers. I discussed the limitations of evaluation and management by telemedicine. The patient expressed understanding and agreed to proceed.   Evaluation Performed:  Follow-up visit  Date:  10/28/2018   ID:  Isabel Gonzalez, DOB 06-Dec-1945, MRN [id number]  Patient Location:  [id number] [place] [address]   Provider location:   [facility], [place] office  PCP:  [name], [name]  Cardiologist:  Moody, [place]   Chief Complaint: Hypertension, anxiety, palpitations    History of Present Illness:    Isabel Gonzalez is a 73 y.o. female who presents via audio/video conferencing for a telehealth visit today.   The patient does not symptoms concerning for COVID-19 infection (fever, chills, cough, or new SHORTNESS OF BREATH).   Patient has a past medical history of hyperlipidemia,  Anxiety/panic attacks remote smoking who stopped 25 years ago,  tachypalpitations, PVCs mild to moderate aortic atherosclerosis seen on CT scan Statin myalgias who presents for routine followup of her PAD,  Tachycardia  One week ago,   Numb cheeks, hands,Got worse 355 systolic Called insurance co [employer],  [place] to the ER, w/u then went home  Horrible dreams  102/62 today Higher 137/108 at times  Started taking  Xanax at night with 1/2 diltiazem Seems to have helped her sleeping  Walks 3 to 4 miles a day No chest pain or shortness of breath on exertion  Off zetia, possible side effects, "flu?"  Over 1 year ago Did not retry it  Other past medical history reviewed Emergency surgery 3 weeks ago Severe ABD pain surgery, etiology  D/C 02/28/2017  inflammation of jejunum, umbilicus hernia fixed  previous  fall while she was running steps May 2016 Suffered a Left humerus fx 10/2014  Previously had trouble on Lipitor, Crestor, vytorin. Reports having muscle ache   Prior CV studies:   The following studies were reviewed today:  Previous CT scan from 01/17/2013  showed mild to moderate descending aortic atherosclerotic plaque   CT scan of abdomen pelvis with contrast at [facility] also documented aortic atherosclerosis    Past Medical History:  Diagnosis Date  . Anemia   . Anxiety   . Arthritis   . Cancer (HCC)    squamous cell  . Constipation   . Dysrhythmia    palpitations   . Environmental allergies   . GERD (gastroesophageal reflux disease)   . History of bladder infections   . Hyperlipidemia   . Murmur   . Urinary problem    Past Surgical History:  Procedure Laterality Date  . BREAST REDUCTION SURGERY    . FACIAL COSMETIC SURGERY    .  LAPAROTOMY N/A 02/24/2017   Procedure: EXPLORATORY LAPAROTOMY and repair small hernia;  Surgeon: Jules Husbands, MD;  Location: [facility];  Service: General;  Laterality: N/A;  . ORIF HUMERUS FRACTURE Right 07/08/2015   Procedure: OPEN REDUCTION INTERNAL FIXATION (ORIF) HUMERAL SHAFT FRACTURE with bone graft and allograft;  Surgeon: Justice Britain, MD;  Location: [place];  Service: Orthopedics;  Laterality: Right;  . SKIN SURGERY     squamous cell      Current Meds  Medication Sig  . ALPRAZolam (XANAX) 0.5 MG tablet TAKE 1/2 TO 1 TABLET BY MOUTH DAILY AS NEEDED  . amoxicillin (AMOXIL) 500 MG capsule Take 1 capsule (500 mg total) by mouth 3 (three) times daily.  Marland Kitchen aspirin 81 MG EC tablet Take 81 mg by mouth daily.   . Calcium-Vitamin D (CALTRATE 600 PLUS-VIT D PO) Take 1 tablet by mouth daily.   . cetirizine (ZYRTEC ALLERGY) 10 MG tablet Take 10 mg by mouth daily.  Marland Kitchen diltiazem (CARDIZEM) 30 MG tablet TAKE ONE TABLET BY MOUTH 3 TIMES DAILY AS NEEDED  . fluticasone (FLONASE) 50 MCG/ACT nasal spray Place 1 spray into both nostrils as needed. USE TWO PUFFS INTO EACH NOSTRIL DAILY  . Ginger, Zingiber officinalis, (GINGER ROOT PO) Take by mouth.  . Latanoprost 0.005 % EMUL Apply 1 drop to eye daily.  . Magnesium 500 MG CAPS Take by mouth.  . oseltamivir (TAMIFLU) 75 MG capsule Take 1 capsule (75 mg total) by mouth daily.  Marland Kitchen POTASSIUM PO Take by mouth daily.  Marland Kitchen scopolamine (TRANSDERM-SCOP) 1 MG/3DAYS Place 1 patch (1.5 mg total) onto the skin every 3 (three) days.  . vitamin B-12 (CYANOCOBALAMIN) 1000 MCG tablet Take 1 tablet (1,000 mcg total) by mouth daily.  . vitamin C (ASCORBIC ACID) 500 MG tablet Take 500 mg by mouth daily.     Allergies:   Percocet [oxycodone-acetaminophen] and Vicodin [hydrocodone-acetaminophen]   Social History   Tobacco Use  . Smoking status: Former Smoker    Packs/day: 0.25    Years: 25.00    Pack years: 6.25    Last attempt to quit: 06/11/1990    Years since quitting: 28.4  . Smokeless tobacco: Never Used  Substance Use Topics  . Alcohol use: Yes    Alcohol/week: 5.0 standard drinks    Types: 5 Glasses of wine per week  . Drug use: No     Current Outpatient Medications on File Prior to Visit  Medication Sig Dispense Refill  . ALPRAZolam (XANAX) 0.5 MG tablet TAKE 1/2 TO 1 TABLET BY MOUTH DAILY AS NEEDED 90 tablet 1  . amoxicillin (AMOXIL) 500 MG capsule Take 1 capsule (500 mg total) by mouth 3 (three)  times daily. 21 capsule 0  . aspirin 81 MG EC tablet Take 81 mg by mouth daily.     . Calcium-Vitamin D (CALTRATE 600 PLUS-VIT D PO) Take 1 tablet by mouth daily.     . cetirizine (ZYRTEC ALLERGY) 10 MG tablet Take 10 mg by mouth daily.    Marland Kitchen diltiazem (CARDIZEM) 30 MG tablet TAKE ONE TABLET BY MOUTH 3 TIMES DAILY AS NEEDED 180 tablet 1  . fluticasone (FLONASE) 50 MCG/ACT nasal spray Place 1 spray into both nostrils as needed. USE TWO PUFFS INTO EACH NOSTRIL DAILY 16 g 3  . Ginger, Zingiber officinalis, (GINGER ROOT PO) Take by mouth.    . Latanoprost 0.005 % EMUL Apply 1 drop to eye daily.    . Magnesium 500 MG CAPS Take by mouth.    Marland Kitchen  oseltamivir (TAMIFLU) 75 MG capsule Take 1 capsule (75 mg total) by mouth daily. 10 capsule 0  . POTASSIUM PO Take by mouth daily.    Marland Kitchen scopolamine (TRANSDERM-SCOP) 1 MG/3DAYS Place 1 patch (1.5 mg total) onto the skin every 3 (three) days. 4 patch 0  . vitamin B-12 (CYANOCOBALAMIN) 1000 MCG tablet Take 1 tablet (1,000 mcg total) by mouth daily. 30 tablet 0  . vitamin C (ASCORBIC ACID) 500 MG tablet Take 500 mg by mouth daily.     No current facility-administered medications on file prior to visit.      Family Hx: The patient's family history includes Diabetes in her brother; Stroke (age of onset: 62) in her mother.  ROS:   Please see the history of present illness.    Review of Systems  Constitutional: Negative.   Respiratory: Negative.   Cardiovascular: Negative.   Gastrointestinal: Negative.   Musculoskeletal: Negative.   Neurological: Negative.   Psychiatric/Behavioral: The patient is nervous/anxious.   All other systems reviewed and are negative.     Labs/Other Tests and Data Reviewed:    Recent Labs: 02/06/2018: ALT 23; BUN 12; Creatinine, Ser 0.60; Hemoglobin 14.4; Platelets 338; Potassium 4.0; Sodium 140; TSH 1.250   Recent Lipid Panel Lab Results  Component Value Date/Time   CHOL 248 (H) 02/06/2018 03:00 PM   TRIG 293 (H) 02/06/2018  03:00 PM   HDL 59 02/06/2018 03:00 PM   CHOLHDL 3.7 03/27/2016 11:24 AM   CHOLHDL 4.0 Ratio 08/24/2009 08:24 PM   LDLCALC 130 (H) 02/06/2018 03:00 PM    Wt Readings from Last 3 Encounters:  03/08/18 135 lb 9.6 oz (61.5 kg)  02/06/18 133 lb (60.3 kg)  03/15/17 129 lb 8 oz (58.7 kg)     Exam:    Vital Signs: Vital signs may also be detailed in the HPI There were no vitals taken for this visit.  Wt Readings from Last 3 Encounters:  03/08/18 135 lb 9.6 oz (61.5 kg)  02/06/18 133 lb (60.3 kg)  03/15/17 129 lb 8 oz (58.7 kg)   Temp Readings from Last 3 Encounters:  03/08/18 (!) 97.4 F (36.3 C) (Oral)  02/06/18 97.8 F (36.6 C) (Oral)  03/13/17 97.8 F (36.6 C) (Oral)   BP Readings from Last 3 Encounters:  03/08/18 128/76  02/06/18 130/78  03/15/17 114/64   Pulse Readings from Last 3 Encounters:  03/08/18 (!) 59  02/06/18 60  03/15/17 80    102/62 today Higher 137/108 Pulse 80, resp 16  Well nourished, well developed female in no acute distress. Constitutional:  oriented to person, place, and time. No distress.  Head: Normocephalic and atraumatic.  Eyes:  no discharge. No scleral icterus.  Neck: Normal range of motion. Neck supple.  Pulmonary/Chest: No audible wheezing, no distress, appears comfortable Musculoskeletal: Normal range of motion.  no  tenderness or deformity.  Neurological:   Coordination normal. Full exam not performed Skin:  No rash Psychiatric:  normal mood and affect. behavior is normal. Thought content normal.    ASSESSMENT & PLAN:    Aortic atherosclerosis ([place]) She has a friend that takes pravastatin and she would like to try We have sent in pravastatin 20 mg daily Previous had intolerance to other statins Total cholesterol typically running 250 Was previously on Zetia in 2018 and tolerated it for quite some time but took herself off for unclear reasons, possibly flu type symptoms Potentially we could revisit Zetia at a later date if  needed  Frequent PVCs  Concerned about occasional erratic heartbeat but is relatively asymptomatic Discussed with her, this was not atrial fibrillation and did not need blood thinners but we would periodically do EKG  Chest pain, unspecified type [place] showed [place] findings  Hypercholesteremia Discussed with her, we will start pravastatin as above  Anxiety She has Xanax which she takes for sleep and sometimes in the daytime, also diltiazem for spikes in her blood pressure She is already walking Stress could come from being separated from family also [place]   COVID-19 Education: The signs and symptoms of COVID-19 were discussed with the patient and how to seek care for testing (follow up with PCP or arrange E-visit).  The importance of social distancing was discussed today.  Patient Risk:   After full review of this patients clinical status, I feel that they are at least moderate risk at this time.  Time:   Today, I have spent 25 minutes with the patient with telehealth technology discussing the cardiac and medical problems/diagnoses detailed above   10 min spent reviewing the chart prior to patient visit today   Medication Adjustments/Labs and Tests Ordered: Current medicines are reviewed at length with the patient today.  Concerns regarding medicines are outlined above.   Tests Ordered: No tests ordered   Medication Changes: No changes made   Disposition: Follow-up in 12 months   Signed, Ida Rogue, MD  10/28/2018 11:17 AM    [place] Office [address] [address], [place], [place] [address]

## 2018-10-28 ENCOUNTER — Telehealth (INDEPENDENT_AMBULATORY_CARE_PROVIDER_SITE_OTHER): Payer: PPO | Admitting: Cardiovascular Disease

## 2018-10-28 ENCOUNTER — Other Ambulatory Visit: Payer: Self-pay

## 2018-10-28 DIAGNOSIS — I700 Atherosclerosis of aorta: Secondary | ICD-10-CM | POA: Diagnosis not present

## 2018-10-28 DIAGNOSIS — F419 Anxiety disorder, unspecified: Secondary | ICD-10-CM

## 2018-10-28 DIAGNOSIS — R079 Chest pain, unspecified: Secondary | ICD-10-CM

## 2018-10-28 DIAGNOSIS — E7800 Pure hypercholesterolemia, unspecified: Secondary | ICD-10-CM

## 2018-10-28 DIAGNOSIS — I493 Ventricular premature depolarization: Secondary | ICD-10-CM

## 2018-10-28 MED ORDER — PRAVASTATIN SODIUM 20 MG PO TABS: 20.0000 mg | ORAL_TABLET | Freq: Every evening | ORAL | 3 refills | Status: DC

## 2018-10-28 NOTE — Patient Instructions (Addendum)
Medication Instructions:  Your physician has recommended you make the following change in your medication:  1. START Pravastatin 20 mg once daily  If you need a refill on your cardiac medications before your next appointment, please call your pharmacy.    Lab work: No new labs needed   If you have labs (blood work) drawn today and your tests are completely normal, you will receive your results only by: Marland Kitchen MyChart Message (if you have MyChart) OR . A paper copy in the mail If you have any lab test that is abnormal or we need to change your treatment, we will call you to review the results.   Testing/Procedures: No new testing needed   Follow-Up: At [facility], you and your health needs are our priority.  As part of our continuing mission to provide you with exceptional heart care, we have created designated Provider Care Teams.  These Care Teams include your primary Cardiologist (physician) and Advanced Practice Providers (APPs -  Physician Assistants and Nurse Practitioners) who all work together to provide you with the care you need, when you need it.  . You will need a follow up appointment in 12 months .   Please call our office 2 months in advance to schedule this appointment.    . Providers on your designated Care Team:   . [place] Hodgkins, NP . Christell Faith, [name] . Marrianne Mood, [name]  Any Other Special Instructions Will Be Listed Below (If Applicable).  For educational health videos Log in to : [email or web address] Or : [email or web address], password : [email or web address]

## 2018-10-29 ENCOUNTER — Other Ambulatory Visit: Payer: Self-pay | Admitting: Physician Assistant

## 2018-10-29 DIAGNOSIS — F419 Anxiety disorder, unspecified: Secondary | ICD-10-CM

## 2018-10-29 NOTE — Telephone Encounter (Signed)
Please review

## 2018-11-01 ENCOUNTER — Other Ambulatory Visit: Payer: Self-pay | Admitting: Physician Assistant

## 2018-11-01 DIAGNOSIS — J301 Allergic rhinitis due to pollen: Secondary | ICD-10-CM

## 2018-11-05 ENCOUNTER — Ambulatory Visit: Payer: Self-pay

## 2018-11-05 ENCOUNTER — Other Ambulatory Visit: Payer: Self-pay

## 2018-11-05 DIAGNOSIS — I10 Essential (primary) hypertension: Secondary | ICD-10-CM

## 2018-11-05 NOTE — Chronic Care Management (AMB) (Signed)
  Chronic Care Management   Note  11/05/2018 Name: Isabel Gonzalez MRN: [id number] DOB: June 18, 1945  Care Coordination: Successful telephone encounter to Isabel Gonzalez, a 73 year old femalewho sees Fenton Malling, [place] for primary care. Isabel Gonzalez was referred to CCM RN CM by [facility] secondary to patient called [facility] nurse line 10/19/2018 to discuss symptoms of facial numbness and arm tingling. Patient was advised to seek emergency care. Patient presented to ED in [place], [place] as this is where she spends most of her time. She was discharged home to follow up with her Cardiologist Dr. Rockey Situ.  CCM RN CM followed up with Isabel Gonzalez today to determine if she has made contact with Dr. Rockey Situ and if so what recommendations are.   Isabel Gonzalez states Dr. Rockey Situ does not feel she is in need of testing recommended by ED. He has changed her statin to a more tolerable option and states she is to follow up with him in 6 weeks or as needed prior to.  Isabel Gonzalez states she has not had any additional complaints that sent her to the ED. She is doing well and is remaining in [place] at her vacation home. She does not wish to continue engaging with CCM RN CM at this time but will call if any questions or concerns arise.    Plan: No follow up needed at this time.  Isabel Gonzalez E. Rollene Rotunda, RN, BSN Nurse Care Coordinator [facility] Care Management [phone]

## 2018-11-28 ENCOUNTER — Telehealth: Payer: Self-pay | Admitting: Plastic Surgery

## 2018-11-28 NOTE — Telephone Encounter (Signed)

## 2018-11-29 ENCOUNTER — Other Ambulatory Visit: Payer: Self-pay

## 2018-11-29 ENCOUNTER — Ambulatory Visit (INDEPENDENT_AMBULATORY_CARE_PROVIDER_SITE_OTHER): Payer: Self-pay | Admitting: Plastic Surgery

## 2018-11-29 ENCOUNTER — Encounter: Payer: Self-pay | Admitting: Plastic Surgery

## 2018-11-29 DIAGNOSIS — Z719 Counseling, unspecified: Secondary | ICD-10-CM

## 2018-11-29 NOTE — Progress Notes (Signed)
Botulinum Toxin and filler injection Procedure Note  Procedure: Cosmetic botulinum toxin   Pre-operative Diagnosis: Dynamic rhytides and midface volume loss  Post-operative Diagnosis: Same  Complications:  None  Brief history: The patient desires botulinum toxin injection of her forehead. I discussed with the patient this proposed procedure of botulinum toxin injections, which is customized depending on the particular needs of the patient. It is performed on facial rhytids as a temporary correction. The alternatives were discussed with the patient. The risks were addressed including bleeding, scarring, infection, damage to deeper structures, asymmetry, and chronic pain, which may occur infrequently after a procedure. The individual's choice to undergo a surgical procedure is based on the comparison of risks to potential benefits. Other risks include unsatisfactory results, brow ptosis, eyelid ptosis, allergic reaction, temporary paralysis, which should go away with time, bruising, blurring disturbances and delayed healing. Botulinum toxin injections do not arrest the aging process or produce permanent tightening of the eyelid.  Operative intervention maybe necessary to maintain the results of a blepharoplasty or botulinum toxin. The patient understands and wishes to proceed. An informed consent was signed and informational brochures given to her prior to the procedure.  Procedure: The area was prepped with alcohol and dried with a clean gauze. Using a clean technique, the botulinum toxin was diluted with 1.25 cc of preservative-free normal saline which was slowly injected with an 18 gauge needle in a tuberculin syringes.  A 32 gauge needles were then used to inject the botulinum toxin. This mixture allow for an aliquot of 5 units per 0.1 cc in each injection site.    Subsequently the mixture was injected in the glabellar and forehead area with preservation of the temporal branch to the lateral eyebrow  as well as into each lateral canthal area beginning from the lateral orbital rim medial to the zygomaticus major in 3 separate areas. A total of 40 Units of botulinum toxin was used. The forehead and glabellar area was injected with care to inject intramuscular only while holding pressure on the supratrochlear vessels in each area during each injection on either side of the medial corrugators. The injection proceeded vertically superiorly to the medial 2/3 of the frontalis muscle and superior 2/3 of the lateral frontalis, again with preservation of the frontal branch.  The midface area was injected at the 3 sub-regions of the mid-face: zygomaticomalar region, anteromedial cheek region, and submalar region for a total of one syringe on each side of the face. The technique used was serial puncture with equal injections in the 3 sub-regions: the zygomaticomalar region, the anteromedial cheek, and the submalar region.  No complications were noted. Light pressure was held for 5 minutes. She was instructed explicitly in post-operative care.  Botox LOT:  [id number] EXP:  12/22  Restylane - L LOT: [id number] EXP:2021-02-09

## 2018-12-02 ENCOUNTER — Encounter: Payer: Self-pay | Admitting: Plastic Surgery

## 2018-12-02 DIAGNOSIS — Z719 Counseling, unspecified: Secondary | ICD-10-CM | POA: Insufficient documentation

## 2018-12-10 NOTE — Progress Notes (Signed)
Patient: Isabel Gonzalez Female    DOB: 29-Jan-1946   73 y.o.   MRN: [id number] Visit Date: 12/11/2018  Today's Provider: [name], [name]   Chief Complaint  Patient presents with  . Tingling   Subjective:    I,Joseline E. Rosas,RMA am acting as a Education administrator for [employer], [name].  HPI  Patient here with c/o tingling and burning, left lower leg.She reports she has walking a lot. She reports that this has been going on for 3-4 months. She reports it most often happens on days where she is more active. It rarely stops her activity (only one day she stopped). Reports it is intermittent only. She will feel a tingling in her upper, outer thigh that radiates down to the lateral side of the left knee and into the anterior lower leg. If it progresses on the worst days she will feel numbness in the 3rd and 4th toes on the left foot only.   Allergies  Allergen Reactions  . Percocet [Oxycodone-Acetaminophen] Nausea And Vomiting  . Vicodin [Hydrocodone-Acetaminophen] Nausea And Vomiting     Current Outpatient Medications:  .  ALPRAZolam (XANAX) 0.5 MG tablet, TAKE 1/2 TO 1 TABLET EVERY DAY AS NEEDED, Disp: 30 tablet, Rfl: 0 .  aspirin 81 MG EC tablet, Take 81 mg by mouth daily. , Disp: , Rfl:  .  Calcium-Vitamin D (CALTRATE 600 PLUS-VIT D PO), Take 1 tablet by mouth daily. , Disp: , Rfl:  .  cetirizine (ZYRTEC ALLERGY) 10 MG tablet, Take 10 mg by mouth daily., Disp: , Rfl:  .  Cholecalciferol (VITAMIN D) 50 MCG (2000 UT) tablet, Take 2,000 Units by mouth daily., Disp: , Rfl:  .  diltiazem (CARDIZEM) 30 MG tablet, TAKE ONE TABLET BY MOUTH 3 TIMES DAILY AS NEEDED, Disp: 180 tablet, Rfl: 1 .  fluticasone (FLONASE) 50 MCG/ACT nasal spray, USE 2 SPRAYS INTO EACH NOSTRIL DAILY, Disp: 48 g, Rfl: 2 .  Ginger, Zingiber officinalis, (GINGER ROOT PO), Take by mouth., Disp: , Rfl:  .  Latanoprost 0.005 % EMUL, Apply 1 drop to eye daily., Disp: , Rfl:  .  Magnesium 500 MG CAPS, Take  by mouth., Disp: , Rfl:  .  POTASSIUM PO, Take by mouth daily., Disp: , Rfl:  .  pravastatin (PRAVACHOL) 20 MG tablet, Take 1 tablet (20 mg total) by mouth every evening., Disp: 90 tablet, Rfl: 3 .  vitamin B-12 (CYANOCOBALAMIN) 1000 MCG tablet, Take 1 tablet (1,000 mcg total) by mouth daily., Disp: 30 tablet, Rfl: 0 .  vitamin C (ASCORBIC ACID) 500 MG tablet, Take 500 mg by mouth daily., Disp: , Rfl:   Review of Systems  Constitutional: Negative for appetite change, chills, fatigue and fever.  Respiratory: Negative for chest tightness and shortness of breath.   Cardiovascular: Negative for chest pain and palpitations.  Gastrointestinal: Negative for abdominal pain, nausea and vomiting.  Neurological: Positive for numbness. Negative for dizziness and weakness.    Social History   Tobacco Use  . Smoking status: Former Smoker    Packs/day: 0.25    Years: 25.00    Pack years: 6.25    Quit date: 06/11/1990    Years since quitting: 28.5  . Smokeless tobacco: Never Used  Substance Use Topics  . Alcohol use: Yes    Alcohol/week: 5.0 standard drinks    Types: 5 Glasses of wine per week      Objective:   BP (!) 157/84 (BP Location: Left  Arm, Patient Position: Sitting, Cuff Size: Normal)   Pulse 86   Temp 97.8 F (36.6 C) (Oral)   Resp 16   Wt 135 lb (61.2 kg)   BMI 24.69 kg/m  Vitals:   12/11/18 0952  BP: (!) 157/84  Pulse: 86  Resp: 16  Temp: 97.8 F (36.6 C)  TempSrc: Oral  Weight: 135 lb (61.2 kg)     Physical Exam Vitals signs reviewed.  Constitutional:      General: She is not in acute distress.    Appearance: Normal appearance. She is well-developed and normal weight. She is not ill-appearing or diaphoretic.  Neck:     Musculoskeletal: Normal range of motion and neck supple.     Thyroid: No thyromegaly.     Vascular: No carotid bruit or JVD.     Trachea: No tracheal deviation.  Cardiovascular:     Rate and Rhythm: Normal rate and regular rhythm.     Heart  sounds: Normal heart sounds. No murmur. No friction rub. No gallop.   Pulmonary:     Effort: Pulmonary effort is normal. No respiratory distress.     Breath sounds: Normal breath sounds. No wheezing or rales.  Lymphadenopathy:     Cervical: No cervical adenopathy.  Skin:    Capillary Refill: Capillary refill takes less than 2 seconds.  Neurological:     General: No focal deficit present.     Mental Status: She is alert and oriented to person, place, and time. Mental status is at baseline.     Cranial Nerves: No cranial nerve deficit.     Motor: No weakness.     Coordination: Coordination normal.     Gait: Gait normal.     Deep Tendon Reflexes: Reflexes normal.  Psychiatric:        Mood and Affect: Mood normal.        Behavior: Behavior normal.        Thought Content: Thought content normal.        Judgment: Judgment normal.     No results found for any visits on 12/11/18.     Assessment & Plan    1. Paresthesia Unsure of cause. Discussed organic in nature with exercise could be coming from muscle compression vs arthritic in nature. She voiced understanding. She has also not been taking her supplements as she once was with her Vit D and accidentally bought the wrong medication and has not been taking her B12 either. Will check labs as below and f/u pending results. - CBC w/Diff/Platelet - Basic Metabolic Panel (BMET) - N23 - Vitamin D (25 hydroxy)  2. Hypercholesteremia Previously stable. Will check labs as below and f/u pending results. - Basic Metabolic Panel (BMET) - Lipid Profile  3. B12 deficiency Will check labs as below and f/u pending results. - CBC w/Diff/Platelet - Basic Metabolic Panel (BMET) - F57  4. Family history of diabetes mellitus Will check labs as below and f/u pending results. - HgB A1c  5. Blood glucose elevated Diet controlled. Will check labs as below and f/u pending results. - Basic Metabolic Panel (BMET) - HgB A1c  6. Vitamin D  deficiency Will check labs as below and f/u pending results. - CBC w/Diff/Platelet - Basic Metabolic Panel (BMET) - Vitamin D (25 hydroxy)     [name], [name]  [employer] [facility]

## 2018-12-11 ENCOUNTER — Encounter: Payer: Self-pay | Admitting: Physician Assistant

## 2018-12-11 ENCOUNTER — Ambulatory Visit (INDEPENDENT_AMBULATORY_CARE_PROVIDER_SITE_OTHER): Payer: PPO | Admitting: Physician Assistant

## 2018-12-11 ENCOUNTER — Other Ambulatory Visit: Payer: Self-pay

## 2018-12-11 VITALS — BP 157/84 | HR 86 | Temp 97.8°F | Resp 16 | Wt 135.0 lb

## 2018-12-11 DIAGNOSIS — R202 Paresthesia of skin: Secondary | ICD-10-CM | POA: Diagnosis not present

## 2018-12-11 DIAGNOSIS — E559 Vitamin D deficiency, unspecified: Secondary | ICD-10-CM

## 2018-12-11 DIAGNOSIS — E7800 Pure hypercholesterolemia, unspecified: Secondary | ICD-10-CM

## 2018-12-11 DIAGNOSIS — E538 Deficiency of other specified B group vitamins: Secondary | ICD-10-CM

## 2018-12-11 DIAGNOSIS — Z833 Family history of diabetes mellitus: Secondary | ICD-10-CM

## 2018-12-11 DIAGNOSIS — R739 Hyperglycemia, unspecified: Secondary | ICD-10-CM | POA: Diagnosis not present

## 2018-12-11 NOTE — Patient Instructions (Signed)
Paresthesia Paresthesia is an abnormal burning or prickling sensation. It is usually felt in the hands, arms, legs, or feet. However, it may occur in any part of the body. Usually, paresthesia is not painful. It may feel like:  Tingling or numbness.  Buzzing.  Itching. Paresthesia may occur without any clear cause, or it may be caused by:  Breathing too quickly (hyperventilation).  Pressure on a nerve.  An underlying medical condition.  Side effects of a medication.  Nutritional deficiencies.  Exposure to toxic chemicals. Most people experience temporary (transient) paresthesia at some time in their lives. For some people, it may be long-lasting (chronic) because of an underlying medical condition. If you have paresthesia that lasts a long time, you may need to be evaluated by your health care provider. Follow these instructions at home: Alcohol use   Do not drink alcohol if: ? Your health care provider tells you not to drink. ? You are pregnant, may be pregnant, or are planning to become pregnant.  If you drink alcohol: ? Limit how much you use to:  0-1 drink a day for women.  0-2 drinks a day for men. ? Be aware of how much alcohol is in your drink. In the [place] one drink equals one 12 oz bottle of beer (355 mL), one 5 oz glass of wine (148 mL), or one 1 oz glass of hard liquor (44 mL). Nutrition   Eat a healthy diet. This includes: ? Eating foods that are high in fiber, such as fresh fruits and vegetables, whole grains, and beans. ? Limiting foods that are high in fat and processed sugars, such as fried or sweet foods. General instructions  Take over-the-counter and prescription medicines only as told by your health care provider.  Do not use any products that contain nicotine or tobacco, such as cigarettes and e-cigarettes. These can keep blood from reaching damaged nerves. If you need help quitting, ask your health care provider.  If you have diabetes, work  closely with your health care provider to keep your blood sugar under control.  If you have numbness in your feet: ? Check every day for signs of injury or infection. Watch for redness, warmth, and swelling. ? Wear padded socks and comfortable shoes. These help protect your feet.  Keep all follow-up visits as told by your health care provider. This is important. Contact a health care provider if you:  Have paresthesia that gets worse or does not go away.  Have a burning or prickling feeling that gets worse when you walk.  Have pain, cramps, or dizziness.  Develop a rash. Get help right away if you:  Feel weak.  Have trouble walking or moving.  Have problems with speech, understanding, or vision.  Feel confused.  Cannot control your bladder or bowel movements.  Have numbness after an injury.  Develop new weakness in an arm or leg.  Faint. Summary  Paresthesia is an abnormal burning or prickling sensation that is usually felt in the hands, arms, legs, or feet. It may also occur in other parts of the body.  Paresthesia may occur without any clear cause, or it may be caused by breathing too quickly (hyperventilation), pressure on a nerve, an underlying medical condition, side effects of a medication, nutritional deficiencies, or exposure to toxic chemicals.  If you have paresthesia that lasts a long time, you may need to be evaluated by your health care provider. This information is not intended to replace advice given to you by   your health care provider. Make sure you discuss any questions you have with your health care provider. Document Released: 05/19/2002 Document Revised: 06/24/2018 Document Reviewed: 06/07/2017 Elsevier Patient Education  2020 Elsevier Inc.  

## 2018-12-12 LAB — HEMOGLOBIN A1C
Est. average glucose Bld gHb Est-mCnc: 117 mg/dL
Hgb A1c MFr Bld: 5.7 % — ABNORMAL HIGH (ref 4.8–5.6)

## 2018-12-12 LAB — CBC WITH DIFFERENTIAL/PLATELET
Basophils Absolute: 0 10*3/uL (ref 0.0–0.2)
Basos: 1 %
EOS (ABSOLUTE): 0.1 10*3/uL (ref 0.0–0.4)
Eos: 2 %
Hematocrit: 42.6 % (ref 34.0–46.6)
Hemoglobin: 14.3 g/dL (ref 11.1–15.9)
Immature Grans (Abs): 0 10*3/uL (ref 0.0–0.1)
Immature Granulocytes: 0 %
Lymphocytes Absolute: 1.3 10*3/uL (ref 0.7–3.1)
Lymphs: 22 %
MCH: 31.4 pg (ref 26.6–33.0)
MCHC: 33.6 g/dL (ref 31.5–35.7)
MCV: 93 fL (ref 79–97)
Monocytes Absolute: 0.5 10*3/uL (ref 0.1–0.9)
Monocytes: 9 %
Neutrophils Absolute: 3.8 10*3/uL (ref 1.4–7.0)
Neutrophils: 66 %
Platelets: 306 10*3/uL (ref 150–450)
RBC: 4.56 x10E6/uL (ref 3.77–5.28)
RDW: 12.3 % (ref 11.7–15.4)
WBC: 5.7 10*3/uL (ref 3.4–10.8)

## 2018-12-12 LAB — BASIC METABOLIC PANEL
BUN/Creatinine Ratio: 24 (ref 12–28)
BUN: 16 mg/dL (ref 8–27)
CO2: 22 mmol/L (ref 20–29)
Calcium: 10.5 mg/dL — ABNORMAL HIGH (ref 8.7–10.3)
Chloride: 101 mmol/L (ref 96–106)
Creatinine, Ser: 0.66 mg/dL (ref 0.57–1.00)
GFR calc Af Amer: 101 mL/min/{1.73_m2} (ref >59)
GFR calc non Af Amer: 87 mL/min/{1.73_m2} (ref >59)
Glucose: 94 mg/dL (ref 65–99)
Potassium: 4.6 mmol/L (ref 3.5–5.2)
Sodium: 139 mmol/L (ref 134–144)

## 2018-12-12 LAB — LIPID PANEL
Chol/HDL Ratio: 2.8 ratio (ref 0.0–4.4)
Cholesterol, Total: 170 mg/dL (ref 100–199)
HDL: 61 mg/dL (ref >39)
LDL Calculated: 91 mg/dL (ref 0–99)
Triglycerides: 90 mg/dL (ref 0–149)
VLDL Cholesterol Cal: 18 mg/dL (ref 5–40)

## 2018-12-12 LAB — VITAMIN B12: Vitamin B-12: >2000 pg/mL — ABNORMAL HIGH (ref 232–1245)

## 2018-12-12 LAB — VITAMIN D 25 HYDROXY (VIT D DEFICIENCY, FRACTURES): Vit D, 25-Hydroxy: 47.6 ng/mL (ref 30.0–100.0)

## 2018-12-18 ENCOUNTER — Telehealth: Payer: Self-pay

## 2018-12-18 NOTE — Telephone Encounter (Signed)
-----   Message from [name], [place] sent at 12/17/2018  5:29 PM EDT ----- All labs are within normal limits and stable, with exception of calcium. It is elevated at this time. I would recommend to hold any calcium supplements if taking.  We can recheck calcium in 2-3 weeks. Thanks! -JB

## 2018-12-18 NOTE — Telephone Encounter (Signed)
Patient advised as directed below. 

## 2019-01-14 ENCOUNTER — Telehealth: Payer: Self-pay | Admitting: Cardiovascular Disease

## 2019-01-14 MED ORDER — PRAVASTATIN SODIUM 20 MG PO TABS: 20.0000 mg | ORAL_TABLET | Freq: Every evening | ORAL | 2 refills | Status: DC

## 2019-01-14 NOTE — Telephone Encounter (Signed)
Pravastatin refilled. FYI update fwd to Dr.Gollan

## 2019-01-14 NOTE — Telephone Encounter (Signed)
Patient wanted to let us know the medication that Dr. Rockey Situ prescribed for her is helping her .

## 2019-02-18 NOTE — Progress Notes (Signed)
Subjective:   Isabel Gonzalez is a 73 y.o. female who presents for Medicare Annual (Subsequent) preventive examination.    This visit is being conducted through telemedicine due to the COVID-19 pandemic. This patient has given me verbal consent via doximity to conduct this visit, patient states they are participating from their home address. Some vital signs may be absent or patient reported.    Patient identification: identified by name, DOB, and current address  Review of Systems:  N/A  Cardiac Risk Factors include: advanced age (>52men, >32 women);hypertension     Objective:     Vitals: There were no vitals taken for this visit.  There is no height or weight on file to calculate BMI. Unable to obtain vitals due to visit being conducted via telephonically.   Advanced Directives 02/19/2019 03/13/2017 03/06/2017 02/23/2017 03/27/2016 07/08/2015 07/02/2015  Does Patient Have a Medical Advance Directive? Yes Yes Yes Yes Yes Yes Yes  Type of Paramedic of [place];Living will [place];Living will [place];Living will Living will;Healthcare Power of [place];Living will [place];Living will [place];Living will  Does patient want to make changes to medical advance directive? - - No - Patient declined No - Patient declined - - -  Copy of [place] in Chart? Yes - validated most recent copy scanned in chart (See row information) No - copy requested - Yes - Yes Yes    Tobacco Social History   Tobacco Use  Smoking Status Former Smoker  . Packs/day: 0.25  . Years: 25.00  . Pack years: 6.25  . Quit date: 06/11/1990  . Years since quitting: 28.7  Smokeless Tobacco Never Used     Counseling given: Not Answered   Clinical Intake:  Pre-visit preparation completed: Yes  Pain : No/denies pain Pain Score: 0-No pain     Nutritional  Risks: None Diabetes: No  How often do you need to have someone help you when you read instructions, pamphlets, or other written materials from your doctor or pharmacy?: 1 - Never  Interpreter Needed?: No  Information entered by :: [facility], LPN  Past Medical History:  Diagnosis Date  . Anemia   . Anxiety   . Arthritis   . Cancer (HCC)    squamous cell  . Constipation   . Dysrhythmia    palpitations   . Environmental allergies   . GERD (gastroesophageal reflux disease)   . History of bladder infections   . Hyperlipidemia   . Murmur   . Urinary problem    Past Surgical History:  Procedure Laterality Date  . BREAST REDUCTION SURGERY    . FACIAL COSMETIC SURGERY    . LAPAROTOMY N/A 02/24/2017   Procedure: EXPLORATORY LAPAROTOMY and repair small hernia;  Surgeon: Jules Husbands, MD;  Location: [facility] ORS;  Service: General;  Laterality: N/A;  . ORIF HUMERUS FRACTURE Right 07/08/2015   Procedure: OPEN REDUCTION INTERNAL FIXATION (ORIF) HUMERAL SHAFT FRACTURE with bone graft and allograft;  Surgeon: Justice Britain, MD;  Location: [facility];  Service: Orthopedics;  Laterality: Right;  . SKIN SURGERY     squamous cell   Family History  Problem Relation Age of Onset  . Stroke Mother 39  . Diabetes Brother    Social History   Socioeconomic History  . Marital status: Widowed    Spouse name: Not on file  . Number of children: 3  . Years of education:  Not on file  . Highest education level: Some college, no degree  Occupational History  . Occupation: retired  Scientific laboratory technician  . Financial resource strain: Not hard at all  . Food insecurity    Worry: Never true    Inability: Never true  . Transportation needs    Medical: No    Non-medical: No  Tobacco Use  . Smoking status: Former Smoker    Packs/day: 0.25    Years: 25.00    Pack years: 6.25    Quit date: 06/11/1990    Years since quitting: 28.7  . Smokeless tobacco: Never Used  Substance and Sexual Activity  . Alcohol use:  Yes    Alcohol/week: 7.0 standard drinks    Types: 7 Glasses of wine per week  . Drug use: No  . Sexual activity: Not on file  Lifestyle  . Physical activity    Days per week: 0 days    Minutes per session: 0 min  . Stress: Only a little  Relationships  . Social Herbalist on phone: Patient refused    Gets together: Patient refused    Attends religious service: Patient refused    Active member of club or organization: Patient refused    Attends meetings of clubs or organizations: Patient refused    Relationship status: Patient refused  Other Topics Concern  . Not on file  Social History Narrative   Regular exercise          Outpatient Encounter Medications as of 02/19/2019  Medication Sig  . ALPRAZolam (XANAX) 0.5 MG tablet TAKE 1/2 TO 1 TABLET EVERY DAY AS NEEDED  . Apoaequorin (PREVAGEN) 10 MG CAPS Take 10 mg by mouth daily.  Marland Kitchen aspirin 81 MG EC tablet Take 81 mg by mouth daily.   . cetirizine (ZYRTEC ALLERGY) 10 MG tablet Take 10 mg by mouth daily.  . Cholecalciferol (VITAMIN D) 50 MCG (2000 UT) tablet Take 2,000 Units by mouth daily.  Marland Kitchen diltiazem (CARDIZEM) 30 MG tablet TAKE ONE TABLET BY MOUTH 3 TIMES DAILY AS NEEDED  . fluticasone (FLONASE) 50 MCG/ACT nasal spray USE 2 SPRAYS INTO EACH NOSTRIL DAILY  . Ginger, Zingiber officinalis, (GINGER ROOT PO) Take by mouth daily.   Marland Kitchen latanoprost (XALATAN) 0.005 % ophthalmic solution Place 1 drop into both eyes at bedtime.  . Magnesium 500 MG CAPS Take by mouth daily.   Marland Kitchen POTASSIUM PO Take by mouth daily.  . pravastatin (PRAVACHOL) 20 MG tablet Take 1 tablet (20 mg total) by mouth every evening.  Marland Kitchen Specialty Vitamins Products (ONE-A-DAY BONE STRENGTH PO) Take by mouth daily.  . vitamin B-12 (CYANOCOBALAMIN) 1000 MCG tablet Take 1 tablet (1,000 mcg total) by mouth daily.  . vitamin C (ASCORBIC ACID) 500 MG tablet Take 500 mg by mouth daily.  . Calcium-Vitamin D (CALTRATE 600 PLUS-VIT D PO) Take 1 tablet by mouth daily.   .  Latanoprost 0.005 % EMUL Apply 1 drop to eye daily.   No facility-administered encounter medications on file as of 02/19/2019.     Activities of Daily Living In your present state of health, do you have any difficulty performing the following activities: 02/19/2019  Hearing? N  Vision? 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  Difficulty concentrating or making decisions? N  Walking or climbing stairs? N  Dressing or bathing? N  Doing errands, shopping? N  Preparing Food and eating ? N  Using the Toilet? N  In the past six months,  have you accidently leaked urine? N  Do you have problems with loss of bowel control? N  Managing your Medications? N  Managing your Finances? N  Housekeeping or managing your Housekeeping? N  Some recent data might be hidden    Patient Care Team: [name], [name] as PCP - General (Family Medicine) Minna Merritts, MD as Consulting Physician (Cardiology) Oneta Rack, MD as Consulting Physician (Dermatology) Benedetto Goad, RN as Case Manager    Assessment:   This is a routine wellness examination for Adrienne Gisi.  Exercise Activities and Dietary recommendations Current Exercise Habits: Home exercise routine, Type of exercise: walking, Time (Minutes): 60, Frequency (Times/Week): 5, Weekly Exercise (Minutes/Week): 300, Intensity: Mild, Exercise limited by: None identified  Goals    . DIET - INCREASE WATER INTAKE     Recommend to drink at least 6-8 8oz glasses of water per day.       Fall Risk: Fall Risk  02/19/2019 02/06/2018 03/13/2017 03/27/2016 03/19/2015  Falls in the past year? 0 No No No Yes  Number falls in past yr: - - - - 1  Injury with Fall? - - - - Yes    [place]:  Any stairs in or around the home? Yes  If so, are there any without handrails? No   Home free of loose throw rugs in walkways, pet beds, electrical cords, etc? Yes  Adequate lighting in your home to reduce risk of falls?  Yes   ASSISTIVE DEVICES UTILIZED TO PREVENT FALLS:  Life alert? No  Use of a cane, walker or w/c? No  Grab bars in the bathroom? No  Shower chair or bench in shower? No  Elevated toilet seat or a handicapped toilet? Yes    TIMED UP AND GO:  Was the test performed? No .    Depression Screen PHQ 2/9 Scores 02/19/2019 02/06/2018 03/13/2017 03/13/2017  PHQ - 2 Score 0 0 0 0  PHQ- 9 Score - 0 2 -     Cognitive Function: Declined today.         Immunization History  Administered Date(s) Administered  . Influenza Split 03/29/2009  . Influenza-Unspecified 03/20/2018, 02/12/2019  . Pneumococcal Conjugate-13 12/10/2013  . Pneumococcal Polysaccharide-23 06/02/2010  . Td 06/29/2003  . Zoster 04/18/2006  . Zoster Recombinat (Shingrix) 09/07/2017    Qualifies for Shingles Vaccine? Yes , Shingrix #1 completed, awaiting second dose.   Tdap: Although this vaccine is not a covered service during a Wellness Exam, does the patient still wish to receive this vaccine today?  No .   Flu Vaccine: Up to date  Pneumococcal Vaccine: Completed series   Screening Tests Health Maintenance  Topic Date Due  . MAMMOGRAM  08/03/1994  . COLONOSCOPY  04/29/2017  . TETANUS/TDAP  03/09/2023 (Originally 06/28/2013)  . DEXA SCAN  03/15/2023  . INFLUENZA VACCINE  Completed  . Hepatitis C Screening  Completed  . PNA vac Low Risk Adult  Completed    Cancer Screenings:  Colorectal Screening: Completed 04/29/12. Repeat every 5 years. Declined future orders.  Mammogram: Currently due. Pt declines receiving anymore of these screenings.   Bone Density: Completed 03/14/18. Results reflect OSTEOPENIA. Repeat every 5 years.   Lung Cancer Screening: (Low Dose CT Chest recommended if Age 57-80 years, 30 pack-year currently smoking OR have quit w/in 15years.) does not qualify.   Additional Screening:  Hepatitis C Screening: Up to date  Vision Screening: Recommended annual ophthalmology exams for early  detection of glaucoma and other disorders of the eye.  Dental Screening: Recommended annual dental exams for proper oral hygiene  Community Resource Referral:  CRR required this visit?  No       Plan:  I have personally reviewed and addressed the Medicare Annual Wellness questionnaire and have noted the following in the patient's chart:  A. Medical and social history B. Use of alcohol, tobacco or illicit drugs  C. Current medications and supplements D. Functional ability and status E.  Nutritional status F.  Physical activity G. Advance directives H. List of other physicians I.  Hospitalizations, surgeries, and ER visits in previous 12 months J.  [place] such as hearing and vision if needed, cognitive and depression L. Referrals and appointments   In addition, I have reviewed and discussed with patient certain preventive protocols, quality metrics, and best practice recommendations. A written personalized care plan for preventive services as well as general preventive health recommendations were provided to patient. Nurse Health Advisor  Signed,    Keyanni Whittinghill [place], [place]  [phone] Nurse Health Advisor   Nurse Notes: Pt declined receiving anymore colonoscopy

## 2019-02-19 ENCOUNTER — Ambulatory Visit (INDEPENDENT_AMBULATORY_CARE_PROVIDER_SITE_OTHER): Payer: PPO

## 2019-02-19 ENCOUNTER — Other Ambulatory Visit: Payer: Self-pay

## 2019-02-19 DIAGNOSIS — Z0000 Encounter for general adult medical examination without abnormal findings: Secondary | ICD-10-CM

## 2019-02-19 NOTE — Patient Instructions (Addendum)
[name] , Thank you for taking time to come for your Medicare Wellness Visit. I appreciate your ongoing commitment to your health goals. Please review the following plan we discussed and let me know if I can assist you in the future.   Screening recommendations/referrals: Colonoscopy: Pt declines a future order today.  Mammogram: Pt declines a future order today.  Bone Density: Up to date, due 03/2023 Recommended yearly ophthalmology/optometry visit for glaucoma screening and checkup Recommended yearly dental visit for hygiene and checkup  Vaccinations: Influenza vaccine: Currently due Pneumococcal vaccine: Completed series Tdap vaccine: Pt declines today.  Shingles vaccine: Pt declines today.     Advanced directives: Currently on file.   Conditions/risks identified: Continue to increase water intake to 6-8 8 oz glasses a day.   Next appointment: 03/24/19 @ 10:00 AM with Fenton Malling.   Preventive Care 21 Years and Older, Female Preventive care refers to lifestyle choices and visits with your health care provider that can promote health and wellness. What does preventive care include?  A yearly physical exam. This is also called an annual well check.  Dental exams once or twice a year.  Routine eye exams. Ask your health care provider how often you should have your eyes checked.  Personal lifestyle choices, including:  Daily care of your teeth and gums.  Regular physical activity.  Eating a healthy diet.  Avoiding tobacco and drug use.  Limiting alcohol use.  Practicing safe sex.  Taking low-dose aspirin every day.  Taking vitamin and mineral supplements as recommended by your health care provider. What happens during an annual well check? The services and screenings done by your health care provider during your annual well check will depend on your age, overall health, lifestyle risk factors, and family history of disease. Counseling  Your health care  provider may ask you questions about your:  Alcohol use.  Tobacco use.  Drug use.  Emotional well-being.  Home and relationship well-being.  Sexual activity.  Eating habits.  History of falls.  Memory and ability to understand (cognition).  Work and work Statistician.  Reproductive health. Screening  You may have the following tests or measurements:  Height, weight, and BMI.  Blood pressure.  Lipid and cholesterol levels. These may be checked every 5 years, or more frequently if you are over 59 years old.  Skin check.  Lung cancer screening. You may have this screening every year starting at age 32 if you have a 30-pack-year history of smoking and currently smoke or have quit within the past 15 years.  Fecal occult blood test (FOBT) of the stool. You may have this test every year starting at age 46.  Flexible sigmoidoscopy or colonoscopy. You may have a sigmoidoscopy every 5 years or a colonoscopy every 10 years starting at age 6.  Hepatitis C blood test.  Hepatitis B blood test.  Sexually transmitted disease (STD) testing.  Diabetes screening. This is done by checking your blood sugar (glucose) after you have not eaten for a while (fasting). You may have this done every 1-3 years.  Bone density scan. This is done to screen for osteoporosis. You may have this done starting at age 41.  Mammogram. This may be done every 1-2 years. Talk to your health care provider about how often you should have regular mammograms. Talk with your health care provider about your test results, treatment options, and if necessary, the need for more tests. Vaccines  Your health care provider may recommend certain vaccines,  such as:  Influenza vaccine. This is recommended every year.  Tetanus, diphtheria, and acellular pertussis (Tdap, Td) vaccine. You may need a Td booster every 10 years.  Zoster vaccine. You may need this after age 63.  Pneumococcal 13-valent conjugate (PCV13)  vaccine. One dose is recommended after age 79.  Pneumococcal polysaccharide (PPSV23) vaccine. One dose is recommended after age 78. Talk to your health care provider about which screenings and vaccines you need and how often you need them. This information is not intended to replace advice given to you by your health care provider. Make sure you discuss any questions you have with your health care provider. Document Released: 06/25/2015 Document Revised: 02/16/2016 Document Reviewed: 03/30/2015 Elsevier Interactive Patient Education  2017 [place] Prevention in the Home Falls can cause injuries. They can happen to people of all ages. There are many things you can do to make your home safe and to help prevent falls. What can I do on the outside of my home?  Regularly fix the edges of walkways and driveways and fix any cracks.  Remove anything that might make you trip as you walk through a door, such as a raised step or threshold.  Trim any bushes or trees on the path to your home.  Use bright outdoor lighting.  Clear any walking paths of anything that might make someone trip, such as rocks or tools.  Regularly check to see if handrails are loose or broken. Make sure that both sides of any steps have handrails.  Any raised decks and porches should have guardrails on the edges.  Have any leaves, snow, or ice cleared regularly.  Use sand or salt on walking paths during winter.  Clean up any spills in your garage right away. This includes oil or grease spills. What can I do in the bathroom?  Use night lights.  Install grab bars by the toilet and in the tub and shower. Do not use towel bars as grab bars.  Use non-skid mats or decals in the tub or shower.  If you need to sit down in the shower, use a plastic, non-slip stool.  Keep the floor dry. Clean up any water that spills on the floor as soon as it happens.  Remove soap buildup in the tub or shower regularly.   Attach bath mats securely with double-sided non-slip rug tape.  Do not have throw rugs and other things on the floor that can make you trip. What can I do in the bedroom?  Use night lights.  Make sure that you have a light by your bed that is easy to reach.  Do not use any sheets or blankets that are too big for your bed. They should not hang down onto the floor.  Have a firm chair that has side arms. You can use this for support while you get dressed.  Do not have throw rugs and other things on the floor that can make you trip. What can I do in the kitchen?  Clean up any spills right away.  Avoid walking on wet floors.  Keep items that you use a lot in easy-to-reach places.  If you need to reach something above you, use a strong step stool that has a grab bar.  Keep electrical cords out of the way.  Do not use floor polish or wax that makes floors slippery. If you must use wax, use non-skid floor wax.  Do not have throw rugs and other things on  the floor that can make you trip. What can I do with my stairs?  Do not leave any items on the stairs.  Make sure that there are handrails on both sides of the stairs and use them. Fix handrails that are broken or loose. Make sure that handrails are as long as the stairways.  Check any carpeting to make sure that it is firmly attached to the stairs. Fix any carpet that is loose or worn.  Avoid having throw rugs at the top or bottom of the stairs. If you do have throw rugs, attach them to the floor with carpet tape.  Make sure that you have a light switch at the top of the stairs and the bottom of the stairs. If you do not have them, ask someone to add them for you. What else can I do to help prevent falls?  Wear shoes that:  Do not have high heels.  Have rubber bottoms.  Are comfortable and fit you well.  Are closed at the toe. Do not wear sandals.  If you use a stepladder:  Make sure that it is fully opened. Do not climb  a closed stepladder.  Make sure that both sides of the stepladder are locked into place.  Ask someone to hold it for you, if possible.  Clearly mark and make sure that you can see:  Any grab bars or handrails.  First and last steps.  Where the edge of each step is.  Use tools that help you move around (mobility aids) if they are needed. These include:  Canes.  Walkers.  Scooters.  Crutches.  Turn on the lights when you go into a dark area. Replace any light bulbs as soon as they burn out.  Set up your furniture so you have a clear path. Avoid moving your furniture around.  If any of your floors are uneven, fix them.  If there are any pets around you, be aware of where they are.  Review your medicines with your doctor. Some medicines can make you feel dizzy. This can increase your chance of falling. Ask your doctor what other things that you can do to help prevent falls. This information is not intended to replace advice given to you by your health care provider. Make sure you discuss any questions you have with your health care provider. Document Released: 03/25/2009 Document Revised: 11/04/2015 Document Reviewed: 07/03/2014 Elsevier Interactive Patient Education  2017 [employer].

## 2019-02-24 ENCOUNTER — Other Ambulatory Visit: Payer: Self-pay | Admitting: Physician Assistant

## 2019-02-24 DIAGNOSIS — J301 Allergic rhinitis due to pollen: Secondary | ICD-10-CM

## 2019-03-03 ENCOUNTER — Other Ambulatory Visit: Payer: Self-pay | Admitting: Physician Assistant

## 2019-03-03 DIAGNOSIS — I10 Essential (primary) hypertension: Secondary | ICD-10-CM

## 2019-03-03 DIAGNOSIS — F419 Anxiety disorder, unspecified: Secondary | ICD-10-CM

## 2019-03-08 IMAGING — CT CT ABD-PELV W/ CM
2 of 5 series · 15 of 46 positions shown, 17 images · IV contrast (iopamidol)
Comparison: Small-bowel follow-through 04/24/2013. CT Abdomen and
Pelvis 01/17/2013.

CLINICAL DATA: 71-year-old female with 1 week of epigastric pain,
progressing since this morning and new onset diarrhea.

EXAM:
CT ABDOMEN AND PELVIS WITH CONTRAST
TECHNIQUE: Multidetector CT imaging of the abdomen and pelvis was performed
using the standard protocol following bolus administration of
intravenous contrast.
CONTRAST:  100mL [id number] IOPAMIDOL ([id number]) INJECTION 61%

[Series 2: axial st · axial · 0.65mm/px · z∈[+392,+772]mm · 12 of 86 slices shown, 14 images]
[im 5/86  soft-tissue]
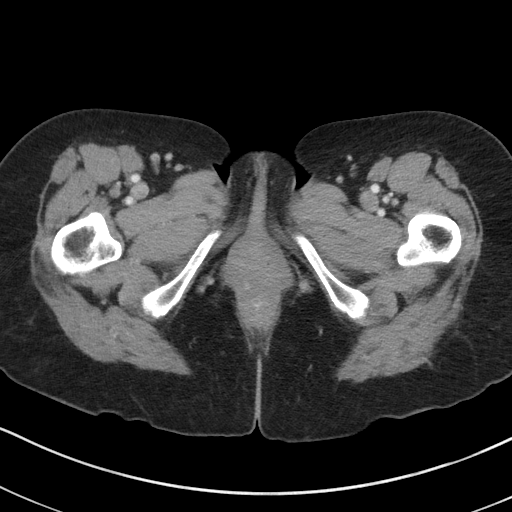
[im 5/86  bone]
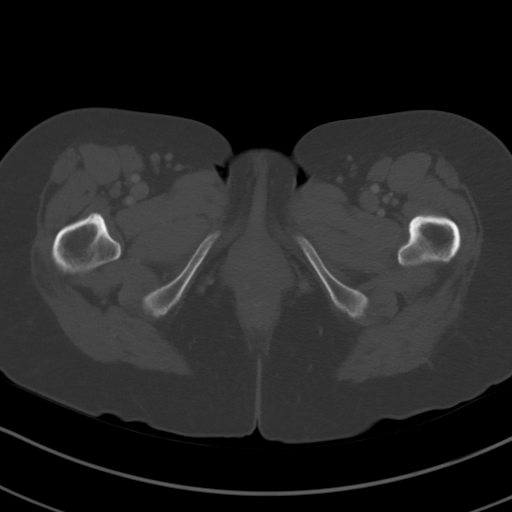
[im 15/86  soft-tissue]
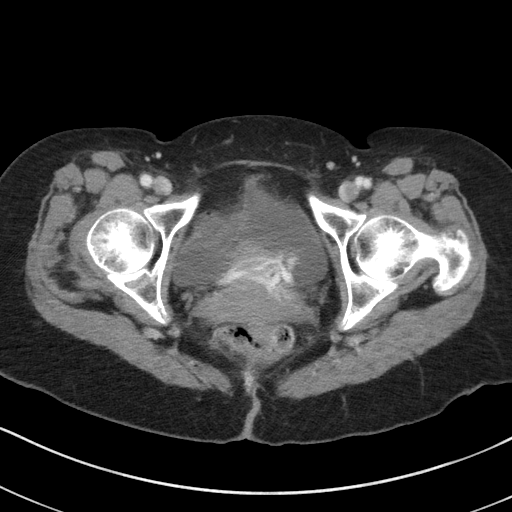
[im 19/86  soft-tissue]
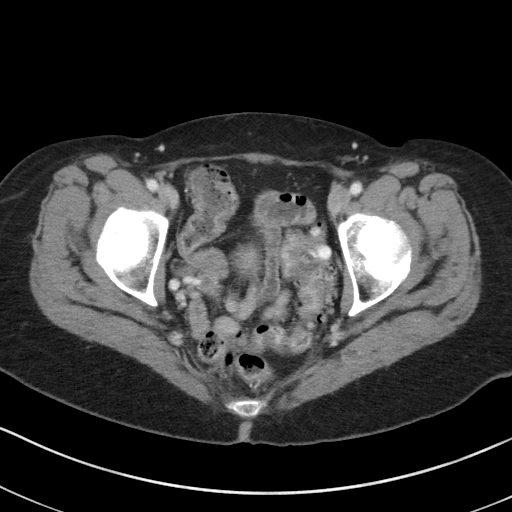
[im 24/86  soft-tissue]
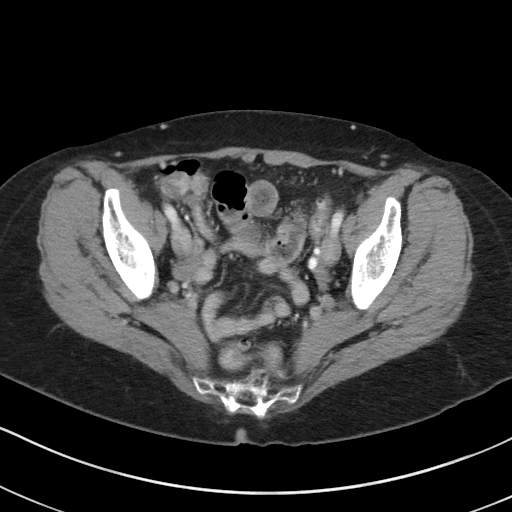
[im 34/86  soft-tissue]
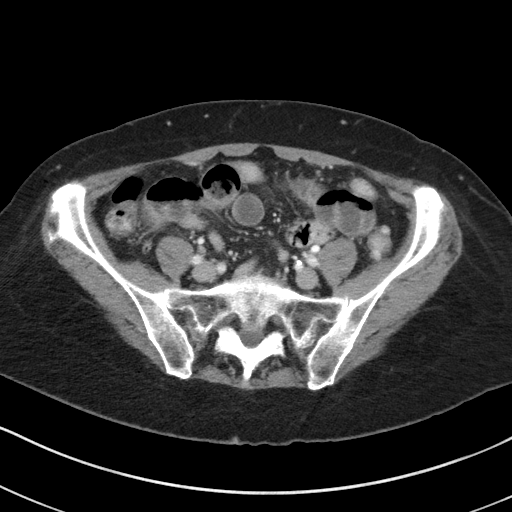
[im 38/86  soft-tissue]
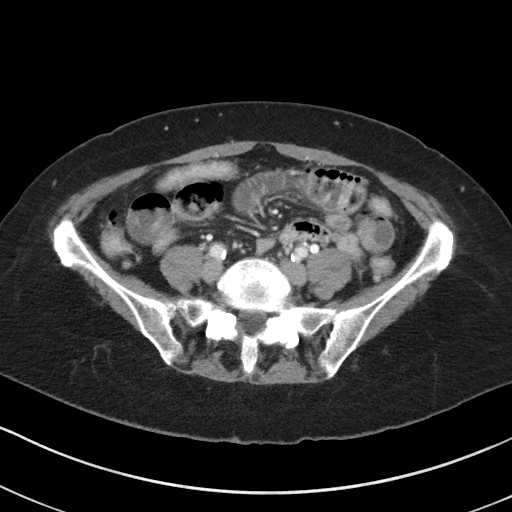
[im 48/86  soft-tissue]
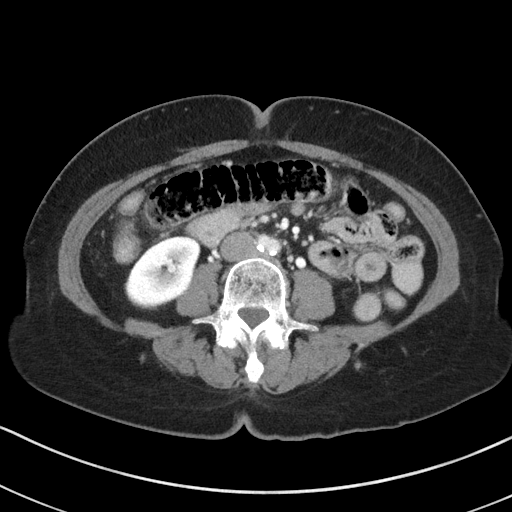
[im 52/86  soft-tissue]
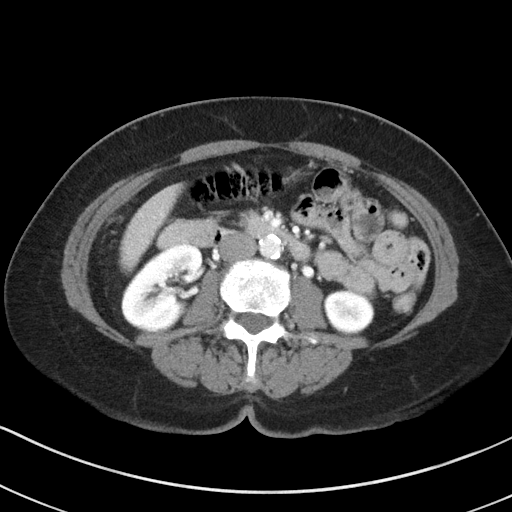
[im 62/86  soft-tissue]
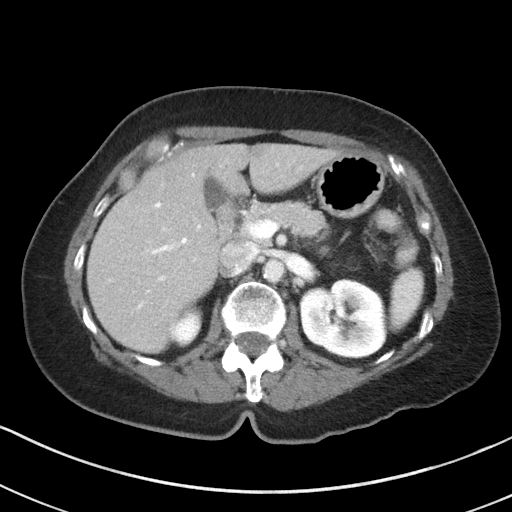
[im 62/86  bone]
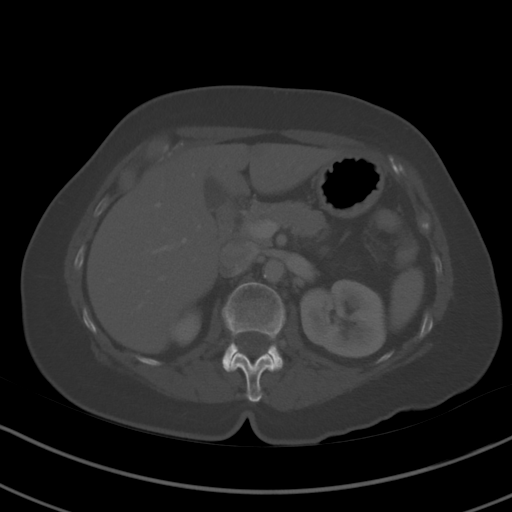
[im 67/86  soft-tissue]
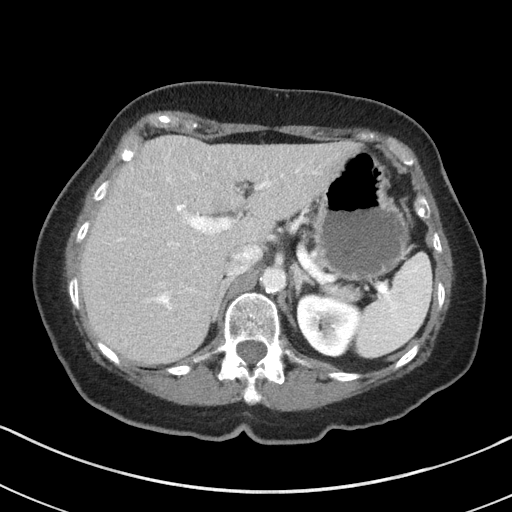
[im 71/86  soft-tissue]
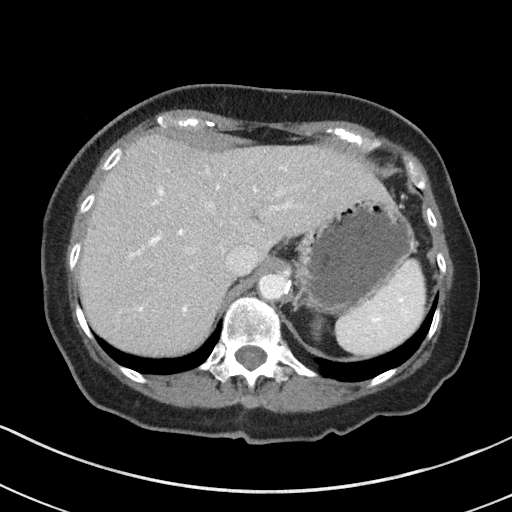
[im 81/86  soft-tissue]
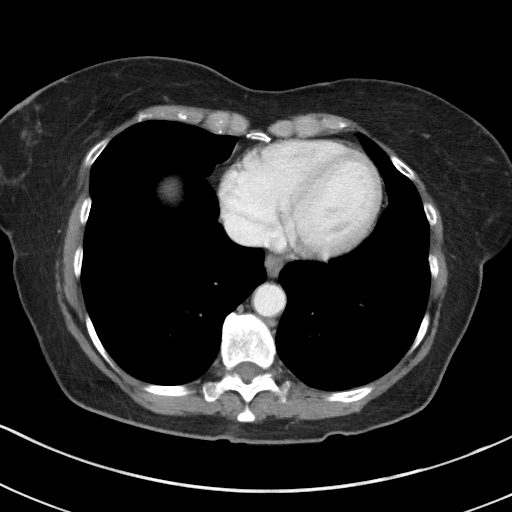

[Series 5: coronal st · coronal · 0.69mm/px · 3 of 71 slices shown]
[im 24/71  soft-tissue]
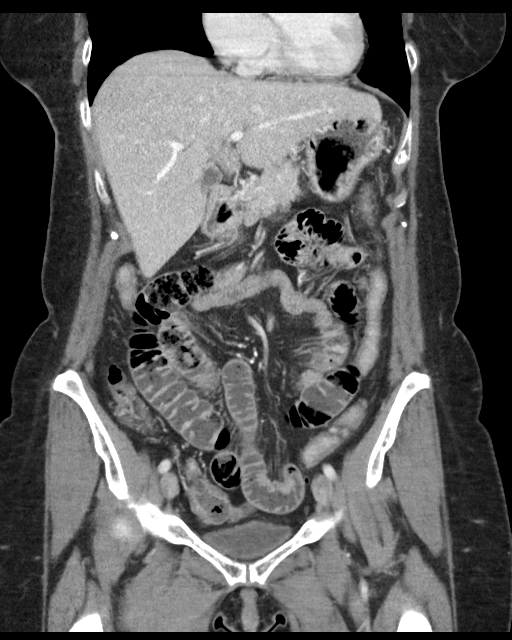
[im 32/71  soft-tissue]
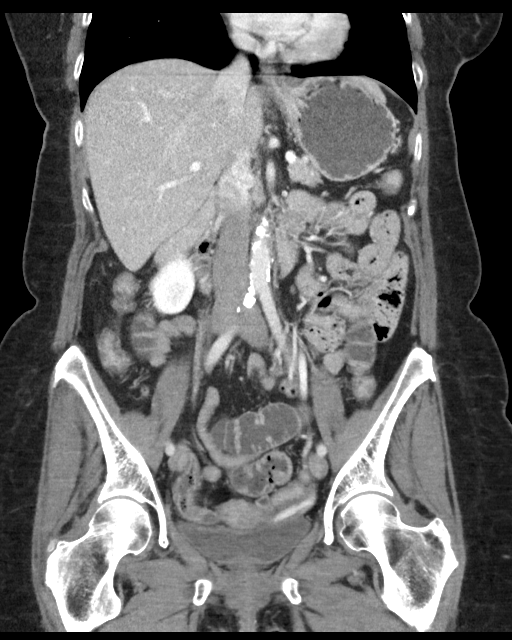
[im 39/71  soft-tissue]
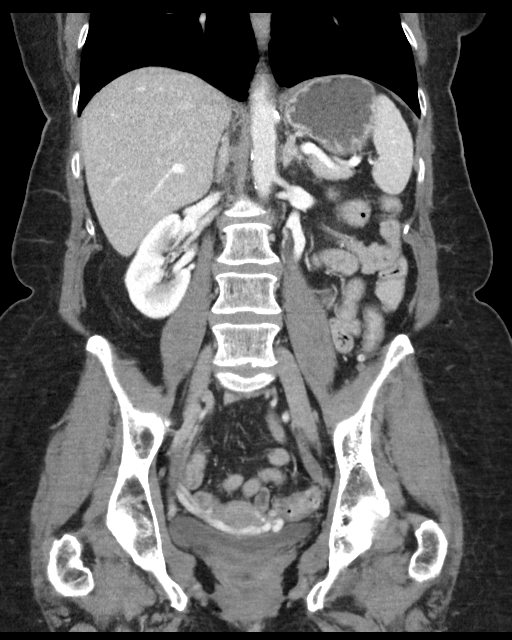

[15 of 46 positions shown; findings below may reference images not displayed]

FINDINGS: Lower chest: Lung bases are stable since 1155 and negative. No
pericardial or pleural effusion.

Hepatobiliary: Negative.

Pancreas: Negative.

Spleen: Negative.

Adrenals/Urinary Tract: Normal adrenal glands. Bilateral renal
enhancement and renal contrast excretion.

Unremarkable urinary bladder.

Stomach/Bowel: Negative rectum. Sigmoid diverticulosis. No active
inflammation identified. Diverticulosis continues throughout the
left colon, no active inflammation. Decompressed transverse colon
and right colon.

The cecum is located in the right hemipelvis. Normal retrocecal
appendix. Decompressed terminal ileum and distal small bowel loops.

However, there are mildly dilated mid abdominal small bowel loops
containing a mix of fluid, air, and some flocculation material. The
transition point appears to be in the left abdomen in the vicinity
of coronal image 26. The proximal small bowel is also decompressed.
The stomach is decompressed. Loops measure up to 3 cm diameter.

Furthermore, there is a swirled appearance of the distal small bowel
mesentery (series 2, image 48), which was not present in 1155.

No associated free air or free fluid. Minimal to mild wall
thickening in the affected small bowel loops.

Vascular/Lymphatic: Calcified aortic atherosclerosis. Major arterial
structures are patent. Portal venous system is patent.

Reproductive: Negative; prominent venous reflux into the gonadal
veins left greater than right.

Other: No pelvic free fluid.

Musculoskeletal: No acute osseous abnormality identified.
IMPRESSION: 1. Constellation of findings most compatible with a closed loop
obstruction of mid small bowel, favored due to an internal hernia
(note the swirling appearance of the distal small bowel mesentery
which is new since a 1155 CT Abdomen and Pelvis). However, despite
the suspected closed loop mechanism, there is no significant bowel
wall edema at this time. No free air or free fluid. The obstructed
loops measure up to 3 cm diameter.
2. Otherwise negative CT Abdomen and Pelvis; diverticulosis of the
descending and sigmoid colon without active inflammation.
3. Study discussed by telephone with Dr. JOEKOUL JOMANTAS on

## 2019-03-17 ENCOUNTER — Other Ambulatory Visit: Payer: Self-pay | Admitting: Physician Assistant

## 2019-03-17 ENCOUNTER — Telehealth: Payer: Self-pay | Admitting: Physician Assistant

## 2019-03-17 ENCOUNTER — Telehealth: Payer: Self-pay

## 2019-03-17 DIAGNOSIS — R7309 Other abnormal glucose: Secondary | ICD-10-CM

## 2019-03-17 DIAGNOSIS — R739 Hyperglycemia, unspecified: Secondary | ICD-10-CM

## 2019-03-17 DIAGNOSIS — I700 Atherosclerosis of aorta: Secondary | ICD-10-CM

## 2019-03-17 DIAGNOSIS — E7800 Pure hypercholesterolemia, unspecified: Secondary | ICD-10-CM

## 2019-03-17 NOTE — Telephone Encounter (Signed)
Pt needing a call back regarding her Bloodwork f/u appt.  Please call pt back at [phone] asap.  Thanks, [employer]

## 2019-03-17 NOTE — Telephone Encounter (Signed)
Patient was advised labs ordered.

## 2019-03-17 NOTE — Telephone Encounter (Signed)

## 2019-03-17 NOTE — Telephone Encounter (Signed)
Returned call to pt.

## 2019-03-17 NOTE — Progress Notes (Signed)
Labs ordered.

## 2019-03-18 ENCOUNTER — Encounter: Payer: Self-pay | Admitting: Plastic Surgery

## 2019-03-18 ENCOUNTER — Ambulatory Visit (INDEPENDENT_AMBULATORY_CARE_PROVIDER_SITE_OTHER): Payer: Self-pay | Admitting: Plastic Surgery

## 2019-03-18 ENCOUNTER — Other Ambulatory Visit: Payer: Self-pay

## 2019-03-18 VITALS — BP 158/84 | HR 83 | Temp 96.8°F | Ht 62.0 in | Wt 136.4 lb

## 2019-03-18 DIAGNOSIS — E7800 Pure hypercholesterolemia, unspecified: Secondary | ICD-10-CM | POA: Diagnosis not present

## 2019-03-18 DIAGNOSIS — Z719 Counseling, unspecified: Secondary | ICD-10-CM

## 2019-03-18 DIAGNOSIS — I700 Atherosclerosis of aorta: Secondary | ICD-10-CM | POA: Diagnosis not present

## 2019-03-18 DIAGNOSIS — R7309 Other abnormal glucose: Secondary | ICD-10-CM | POA: Diagnosis not present

## 2019-03-18 DIAGNOSIS — R739 Hyperglycemia, unspecified: Secondary | ICD-10-CM | POA: Diagnosis not present

## 2019-03-18 NOTE — Progress Notes (Signed)
Botulinum Toxin and Filler Injection Procedure Note  Procedure: Cosmetic botulinum toxin and Filler administration  Pre-operative Diagnosis: Dynamic rhytides and midface volume loss  Post-operative Diagnosis: Same  Complications:  None  Brief history: The patient desires botulinum toxin injection of her forehead. I discussed with the patient this proposed procedure of botulinum toxin injections, which is customized depending on the particular needs of the patient. It is performed on facial rhytids as a temporary correction. The alternatives were discussed with the patient. The risks were addressed including bleeding, scarring, infection, damage to deeper structures, asymmetry, and chronic pain, which may occur infrequently after a procedure. The individual's choice to undergo a surgical procedure is based on the comparison of risks to potential benefits. Other risks include unsatisfactory results, brow ptosis, eyelid ptosis, allergic reaction, temporary paralysis, which should go away with time, bruising, blurring disturbances and delayed healing. Botulinum toxin injections do not arrest the aging process or produce permanent tightening of the eyelid.  Operative intervention maybe necessary to maintain the results of a blepharoplasty or botulinum toxin. The patient understands and wishes to proceed. An informed consent was signed and informational brochures given to her prior to the procedure.  Procedure: The area was prepped with alcohol and dried with a clean gauze. Using a clean technique, the botulinum toxin was diluted with 1.25 cc of preservative-free normal saline which was slowly injected with an 18 gauge needle in a tuberculin syringes.  A 32 gauge needles were then used to inject the botulinum toxin. This mixture allow for an aliquot of 5 units per 0.1 cc in each injection site.    Subsequently the mixture was injected in the glabellar and forehead area with preservation of the temporal  branch to the lateral eyebrow as well as into each lateral canthal area beginning from the lateral orbital rim medial to the zygomaticus major in 3 separate areas. A total of 32 Units of botulinum toxin was used. The forehead and glabellar area was injected with care to inject intramuscular only while holding pressure on the supratrochlear vessels in each area during each injection on either side of the medial corrugators. The injection proceeded vertically superiorly to the medial 2/3 of the frontalis muscle and superior 2/3 of the lateral frontalis, again with preservation of the frontal branch.  The midface area was injected at the 3 sub-regions of the mid-face: zygomaticomalar region, anteromedial cheek region, and submalar region for a total of one syringe on each side of the face. The technique used was serial puncture with equal injections in the 3 sub-regions: the zygomaticomalar region, the anteromedial cheek, and the submalar region.  No complications were noted. Light pressure was held for 5 minutes. She was instructed explicitly in post-operative care.  Botox LOT:  [id number] EXP:  5/23  J. Voluma XC two LOT: [id number] EXP: 2019-08-02

## 2019-03-19 ENCOUNTER — Telehealth: Payer: Self-pay

## 2019-03-19 ENCOUNTER — Ambulatory Visit: Payer: Self-pay | Admitting: Physician Assistant

## 2019-03-19 LAB — COMPREHENSIVE METABOLIC PANEL
ALT: 39 IU/L — ABNORMAL HIGH (ref 0–32)
AST: 34 IU/L (ref 0–40)
Albumin/Globulin Ratio: 1.9 (ref 1.2–2.2)
Albumin: 4.5 g/dL (ref 3.7–4.7)
Alkaline Phosphatase: 99 IU/L (ref 39–117)
BUN/Creatinine Ratio: 23 (ref 12–28)
BUN: 14 mg/dL (ref 8–27)
Bilirubin Total: 0.5 mg/dL (ref 0.0–1.2)
CO2: 25 mmol/L (ref 20–29)
Calcium: 10 mg/dL (ref 8.7–10.3)
Chloride: 99 mmol/L (ref 96–106)
Creatinine, Ser: 0.62 mg/dL (ref 0.57–1.00)
GFR calc Af Amer: 103 mL/min/{1.73_m2} (ref >59)
GFR calc non Af Amer: 89 mL/min/{1.73_m2} (ref >59)
Globulin, Total: 2.4 g/dL (ref 1.5–4.5)
Glucose: 91 mg/dL (ref 65–99)
Potassium: 4.2 mmol/L (ref 3.5–5.2)
Sodium: 140 mmol/L (ref 134–144)
Total Protein: 6.9 g/dL (ref 6.0–8.5)

## 2019-03-19 LAB — CBC WITH DIFFERENTIAL/PLATELET
Basophils Absolute: 0 10*3/uL (ref 0.0–0.2)
Basos: 0 %
EOS (ABSOLUTE): 0.1 10*3/uL (ref 0.0–0.4)
Eos: 2 %
Hematocrit: 42.6 % (ref 34.0–46.6)
Hemoglobin: 14 g/dL (ref 11.1–15.9)
Immature Grans (Abs): 0 10*3/uL (ref 0.0–0.1)
Immature Granulocytes: 0 %
Lymphocytes Absolute: 1.7 10*3/uL (ref 0.7–3.1)
Lymphs: 22 %
MCH: 31 pg (ref 26.6–33.0)
MCHC: 32.9 g/dL (ref 31.5–35.7)
MCV: 95 fL (ref 79–97)
Monocytes Absolute: 0.7 10*3/uL (ref 0.1–0.9)
Monocytes: 9 %
Neutrophils Absolute: 5.4 10*3/uL (ref 1.4–7.0)
Neutrophils: 67 %
Platelets: 290 10*3/uL (ref 150–450)
RBC: 4.51 x10E6/uL (ref 3.77–5.28)
RDW: 11.9 % (ref 11.7–15.4)
WBC: 8.1 10*3/uL (ref 3.4–10.8)

## 2019-03-19 LAB — HEMOGLOBIN A1C
Est. average glucose Bld gHb Est-mCnc: 120 mg/dL
Hgb A1c MFr Bld: 5.8 % — ABNORMAL HIGH (ref 4.8–5.6)

## 2019-03-19 NOTE — Telephone Encounter (Signed)
-----   Message from [name], [place] sent at 03/19/2019  8:49 AM EDT ----- Blood count is normal. Kidney function is normal. Liver enzymes look good, except one enzyme (ALT) is very borderline high at 39, normal is 32. Limit fatty foods, alcohol and acetaminophen (tylenol) based products. If desired we can recheck in 3 months. A1c is also slightly elevated at 5.8. Limit sugars and simple carbohydrates.

## 2019-03-19 NOTE — Telephone Encounter (Signed)
Patient advised as directed below. 

## 2019-03-24 ENCOUNTER — Ambulatory Visit: Payer: PPO | Admitting: Physician Assistant

## 2019-04-29 ENCOUNTER — Other Ambulatory Visit: Payer: Self-pay | Admitting: *Deleted

## 2019-07-10 NOTE — Progress Notes (Signed)
Patient: Isabel Gonzalez Female    DOB: 07/16/1945   74 y.o.   MRN: [id number] Visit Date: 07/14/2019  Today's Provider: [name], [name]   Chief Complaint  Patient presents with  . Tingling   Subjective:     HPI  Patient here with c/o tingling in left thigh. She reports that this has been going on for over a year and goes away. She reports that for the past 4 days it has been constant and it hurts to touch. Notices it more when she is standing or walking more. Occasionally has some numbness in two of her toes as well. This comes and goes even less frequently. It is not radiating to her toes as it skips the lower leg (no symptoms at all there).   Allergies  Allergen Reactions  . Percocet [Oxycodone-Acetaminophen] Nausea And Vomiting  . Vicodin [Hydrocodone-Acetaminophen] Nausea And Vomiting     Current Outpatient Medications:  .  ALPRAZolam (XANAX) 0.5 MG tablet, TAKE 1/2-1 TABLET BY MOUTH DAILY AS NEEDED, Disp: 30 tablet, Rfl: 5 .  Apoaequorin (PREVAGEN) 10 MG CAPS, Take 10 mg by mouth daily., Disp: , Rfl:  .  aspirin 81 MG EC tablet, Take 81 mg by mouth daily. , Disp: , Rfl:  .  cetirizine (ZYRTEC ALLERGY) 10 MG tablet, Take 10 mg by mouth daily., Disp: , Rfl:  .  Cholecalciferol (VITAMIN D) 50 MCG (2000 UT) tablet, Take 2,000 Units by mouth daily., Disp: , Rfl:  .  diltiazem (CARDIZEM) 30 MG tablet, TAKE ONE TABLET 3 TIMES DAILY AS NEEDED, Disp: 180 tablet, Rfl: 1 .  fluticasone (FLONASE) 50 MCG/ACT nasal spray, TAKE 2 SPRAYS INTO BOTH NOSTRILS DAILY, Disp: 16 g, Rfl: 0 .  Ginger, Zingiber officinalis, (GINGER ROOT PO), Take by mouth daily. , Disp: , Rfl:  .  latanoprost (XALATAN) 0.005 % ophthalmic solution, Place 1 drop into both eyes at bedtime., Disp: , Rfl:  .  Latanoprost 0.005 % EMUL, Apply 1 drop to eye daily., Disp: , Rfl:  .  Magnesium 500 MG CAPS, Take by mouth daily. , Disp: , Rfl:  .  OVER THE COUNTER MEDICATION, Zinc and Elderberry-Take 1  capsule by mouth daily., Disp: , Rfl:  .  POTASSIUM PO, Take by mouth daily., Disp: , Rfl:  .  pravastatin (PRAVACHOL) 20 MG tablet, Take 1 tablet (20 mg total) by mouth every evening., Disp: 90 tablet, Rfl: 2 .  Specialty Vitamins Products (ONE-A-DAY BONE STRENGTH PO), Take by mouth daily., Disp: , Rfl:  .  vitamin B-12 (CYANOCOBALAMIN) 1000 MCG tablet, Take 1 tablet (1,000 mcg total) by mouth daily., Disp: 30 tablet, Rfl: 0 .  vitamin C (ASCORBIC ACID) 500 MG tablet, Take 500 mg by mouth daily., Disp: , Rfl:  .  Calcium-Vitamin D (CALTRATE 600 PLUS-VIT D PO), Take 1 tablet by mouth daily. , Disp: , Rfl:   Review of Systems  Constitutional: Negative.   Respiratory: Negative.   Cardiovascular: Negative.   Musculoskeletal: Negative.  Negative for back pain, gait problem and joint swelling.  Neurological: Positive for numbness. Negative for weakness.    Social History   Tobacco Use  . Smoking status: Former Smoker    Packs/day: 0.25    Years: 25.00    Pack years: 6.25    Quit date: 06/11/1990    Years since quitting: 29.1  . Smokeless tobacco: Never Used  Substance Use Topics  . Alcohol use: Yes    Alcohol/week:  7.0 standard drinks    Types: 7 Glasses of wine per week      Objective:   BP (!) 154/94 (BP Location: Left Arm, Patient Position: Sitting, Cuff Size: Normal) Comment: 123/60's at home  Pulse 74   Temp (!) 97 F (36.1 C) (Temporal)   Resp 16   Wt 135 lb (61.2 kg)   BMI 24.69 kg/m  Vitals:   07/14/19 1324  BP: (!) 154/94  Pulse: 74  Resp: 16  Temp: (!) 97 F (36.1 C)  TempSrc: Temporal  Weight: 135 lb (61.2 kg)  Body mass index is 24.69 kg/m.   Physical Exam Vitals reviewed.  Constitutional:      General: She is not in acute distress.    Appearance: Normal appearance. She is well-developed and normal weight. She is not ill-appearing.  HENT:     Head: Normocephalic and atraumatic.  Cardiovascular:     Rate and Rhythm: Normal rate.     Pulses: Normal  pulses.     Heart sounds: Normal heart sounds. No murmur.  Pulmonary:     Effort: Pulmonary effort is normal. No respiratory distress.  Musculoskeletal:     Cervical back: Normal range of motion and neck supple.     Lumbar back: Normal.  Skin:         Comments: Negative thompson test and negative Homan sign. No lower extremity swelling or discoloration.  Neurological:     Mental Status: She is alert.  Psychiatric:        Mood and Affect: Mood normal.        Behavior: Behavior normal.        Thought Content: Thought content normal.        Judgment: Judgment normal.      No results found for any visits on 07/14/19.     Assessment & Plan    1. Phlebitis Suspect symptoms to be from phlebitis of the small reticular vein in the same area. Symptoms can be worse after a lot of walking or standing. She has used triamcinolone cream topically over the area and that helps to calm it down some. Also discussed using warm compress and OTC NSAID of choice when painful. Call if worsening.  - triamcinolone cream (KENALOG) 0.1 %; Apply 1 application topically 2 (two) times daily.  Dispense: 30 g; Refill: 0  2. Anxiety Stable. Diagnosis pulled for medication refill. Continue current medical treatment plan. - ALPRAZolam (XANAX) 0.5 MG tablet; TAKE 1/2-1 TABLET BY MOUTH DAILY AS NEEDED  Dispense: 30 tablet; Refill: [place], [name]  [facility]

## 2019-07-14 ENCOUNTER — Ambulatory Visit (INDEPENDENT_AMBULATORY_CARE_PROVIDER_SITE_OTHER): Payer: PPO | Admitting: Physician Assistant

## 2019-07-14 ENCOUNTER — Encounter: Payer: Self-pay | Admitting: Physician Assistant

## 2019-07-14 ENCOUNTER — Other Ambulatory Visit: Payer: Self-pay

## 2019-07-14 VITALS — BP 154/94 | HR 74 | Temp 97.0°F | Resp 16 | Wt 135.0 lb

## 2019-07-14 DIAGNOSIS — I809 Phlebitis and thrombophlebitis of unspecified site: Secondary | ICD-10-CM | POA: Diagnosis not present

## 2019-07-14 DIAGNOSIS — F419 Anxiety disorder, unspecified: Secondary | ICD-10-CM

## 2019-07-14 MED ORDER — TRIAMCINOLONE ACETONIDE 0.1 % EX CREA: 1.0000 "application " | TOPICAL_CREAM | Freq: Two times a day (BID) | CUTANEOUS | 0 refills | Status: DC

## 2019-07-14 MED ORDER — ALPRAZOLAM 0.5 MG PO TABS: ORAL_TABLET | ORAL | 5 refills | Status: DC

## 2019-07-14 NOTE — Patient Instructions (Signed)
Phlebitis Phlebitis is soreness and swelling (inflammation) of a vein. Follow these instructions at home: Managing pain, stiffness, and swelling  If told, apply heat to the affected area. Do this as often as told by your doctor. Use the heat source that your doctor tells you to use. This may include a moist heat pack or a heating pad. ? Place a towel between your skin and the heat source. ? Leave the heat on for 20-30 minutes. ? Take off the heat if your skin turns bright red. This is very important if you cannot feel pain, heat, or cold. You may be more likely to get burned.  Raise (elevate) the affected area above the level of your heart while you are sitting or lying down. Medicines  Take over-the-counter and prescription medicines only as told by your doctor.  If you were prescribed an antibiotic medicine, take it as told by your doctor. Do not stop taking the antibiotic even if your condition gets better.  If you take medicines to thin your blood, carry a medical alert card or wear your medical alert jewelry. General instructions   If you have phlebitis in your legs: ? Do not stand or sit for a long time. ? Keep your legs moving. ? Get up and take short walks if you have to sit for a long time. ? Try to avoid bed rest that lasts for a long time. Regular sleep is not bed rest.  Wear compression stockings as told by your doctor. These stockings help: ? To reduce swelling in your legs. ? To prevent blood clots. ? To stop the condition from coming back.  Do not use any products that contain nicotine or tobacco, such as cigarettes and e-cigarettes. If you need help quitting, ask your doctor.  Keep all follow-up visits as told by your doctor. This is important. This may include any follow-up blood tests. Contact a doctor if:  You have strange bruises.  You have bleeding problems.  Your symptoms do not get better.  Your symptoms get worse.  You are taking medicine to treat  swelling (anti-inflammatory medicine) and you get belly (abdominal) pain. Get help right away if:  You have sudden chest pain.  You suddenly have trouble breathing.  You have a fever and your symptoms get worse.  You cough up blood.  You feel dizzy or you pass out.  You have very bad pain and swelling in the affected arm or leg. These symptoms may be an emergency. Do not wait to see if the symptoms will go away. Get medical help right away. Call your local emergency services (911 in the [place] Do not drive yourself to the hospital. Summary  Phlebitis is soreness and swelling (inflammation) of a vein.  Raise (elevate) the affected area above the level of your heart while you are sitting or lying down.  If told, apply heat to the affected area. Do this as often as told by your doctor. Use the heat source that your doctor tells you to use. This may include a moist heat pack or a heating pad.  Take over-the-counter and prescription medicines only as told by your doctor. This information is not intended to replace advice given to you by your health care provider. Make sure you discuss any questions you have with your health care provider. Document Revised: 07/09/2018 Document Reviewed: 07/04/2016 Elsevier Patient Education  2020 [employer].

## 2019-08-28 ENCOUNTER — Other Ambulatory Visit: Payer: Self-pay | Admitting: Physician Assistant

## 2019-08-28 DIAGNOSIS — J301 Allergic rhinitis due to pollen: Secondary | ICD-10-CM

## 2019-08-28 NOTE — Telephone Encounter (Signed)
Requested Prescriptions  Pending Prescriptions Disp Refills  . fluticasone (FLONASE) 50 MCG/ACT nasal spray [Pharmacy Med Name: FLUTICASONE PROPIONATE 50 MCG/ACT N]      Sig: 2 SPRAYS INTO BOTH NOSTRILS EVERY DAY     Ear, Nose, and Throat: Nasal Preparations - Corticosteroids Passed - 08/28/2019 12:55 PM      Passed - Valid encounter within last 12 months    Recent Outpatient Visits          1 month ago Phlebitis   [facility] [place], Clearnce Sorrel, [place]   8 months ago [place], Clearnce Sorrel, [place]   1 year ago [facility] [place], Dionne Bucy, MD   1 year ago Medicare annual wellness visit, subsequent   [place], Clearnce Sorrel, [place]   2 years ago Transition of care performed with sharing of clinical summary   [facility] [name], [place]

## 2019-09-02 ENCOUNTER — Other Ambulatory Visit: Payer: Self-pay

## 2019-09-02 DIAGNOSIS — F419 Anxiety disorder, unspecified: Secondary | ICD-10-CM

## 2019-09-02 MED ORDER — ALPRAZOLAM 0.5 MG PO TABS: ORAL_TABLET | ORAL | 0 refills | Status: DC

## 2019-09-02 NOTE — Telephone Encounter (Signed)
Copied from [place] [id number]. Topic: General - Inquiry >> Sep 02, 2019 10:10 AM Greggory Keen D wrote: Reason for CRM: Pt called saying she was at the beach and left her medications at home.  She is staying for two weeks but she is going to be getting her medications tomorrow.  She had them over-nighted to her yesterday.  She is asking that Tawanna Sat have one Xanax sent to the pharmacy there so she can sleep tonight.  She said she has not slept in three nights.  [facility], [place]  Phone # [phone]  PTS" CB  [phone]

## 2019-09-18 DIAGNOSIS — Z85828 Personal history of other malignant neoplasm of skin: Secondary | ICD-10-CM | POA: Diagnosis not present

## 2019-09-18 DIAGNOSIS — L821 Other seborrheic keratosis: Secondary | ICD-10-CM | POA: Diagnosis not present

## 2019-09-18 DIAGNOSIS — D2262 Melanocytic nevi of left upper limb, including shoulder: Secondary | ICD-10-CM | POA: Diagnosis not present

## 2019-09-18 DIAGNOSIS — D2271 Melanocytic nevi of right lower limb, including hip: Secondary | ICD-10-CM | POA: Diagnosis not present

## 2019-09-18 DIAGNOSIS — D2261 Melanocytic nevi of right upper limb, including shoulder: Secondary | ICD-10-CM | POA: Diagnosis not present

## 2019-09-18 DIAGNOSIS — D2272 Melanocytic nevi of left lower limb, including hip: Secondary | ICD-10-CM | POA: Diagnosis not present

## 2019-09-18 DIAGNOSIS — D225 Melanocytic nevi of trunk: Secondary | ICD-10-CM | POA: Diagnosis not present

## 2019-10-22 DIAGNOSIS — H524 Presbyopia: Secondary | ICD-10-CM | POA: Diagnosis not present

## 2019-10-22 DIAGNOSIS — H2513 Age-related nuclear cataract, bilateral: Secondary | ICD-10-CM | POA: Diagnosis not present

## 2019-10-22 DIAGNOSIS — H401131 Primary open-angle glaucoma, bilateral, mild stage: Secondary | ICD-10-CM | POA: Diagnosis not present

## 2019-11-01 NOTE — Progress Notes (Signed)
Date:  11/03/2019   ID:  Isabel Gonzalez, DOB 1945/10/21, MRN [id number]  Patient Location:  [address] [place] [address]   Provider location:   [facility], [place] office  PCP:  [name], [name]  Cardiologist:  Arvid Right [facility]  Chief Complaint  Patient presents with  . other    12 month follow up. Meds reviewed by the pt. verbally. "doing well."  Pt. c/o BP fluctuating.     History of Present Illness:    Isabel Gonzalez is a 74 y.o. female past medical history of hyperlipidemia,  Anxiety/panic attacks remote smoking who stopped 25 years ago,  tachypalpitations, PVCs mild to moderate aortic atherosclerosis seen on CT scan Statin myalgias who presents for routine followup of her PAD,  Tachycardia  Doing well,  Active Walks frequently, Labile pressure by history sometimes low, other times higher  BP at home: avg [id number] systolic Takes diltiazem as needed Takes diltiazem at night for dreams/sleeps better Previously with Horrible dreams , worse with covid  No recent anxiety attacks Previously on Xanax, not taking this anymore for sleep Walks several times per week No chest pain or shortness of breath on exertion   Off zetia, possible side effects, "flu?"  Over 1 year ago Did not retry it   Tolerating pravastatin 20 mg daily, would like to check lipids  EKG personally reviewed by myself on todays visit Shows normal sinus rhythm rate 67 bpm PVCs  Other past medical history reviewed Emergency surgery 3 weeks ago Severe ABD pain surgery, etiology  D/C 02/28/2017  inflammation of jejunum, umbilicus hernia fixed  previous  fall while she was running steps May 2016 Suffered a Left humerus fx 10/2014  Previously had trouble on Lipitor, Crestor, vytorin. Reports having muscle ache   Prior CV studies:   The following studies were reviewed today:  Previous CT scan from 01/17/2013  showed mild to moderate  descending aortic atherosclerotic plaque   CT scan of abdomen pelvis with contrast at [facility] also documented aortic atherosclerosis   Past Medical History:  Diagnosis Date  . Anemia   . Anxiety   . Arthritis   . Cancer (HCC)    squamous cell  . Constipation   . Dysrhythmia    palpitations   . Environmental allergies   . GERD (gastroesophageal reflux disease)   . History of bladder infections   . Hyperlipidemia   . Murmur   . Urinary problem    Past Surgical History:  Procedure Laterality Date  . BREAST REDUCTION SURGERY    . FACIAL COSMETIC SURGERY    . LAPAROTOMY N/A 02/24/2017   Procedure: EXPLORATORY LAPAROTOMY and repair small hernia;  Surgeon: Jules Husbands, MD;  Location: [facility] ORS;  Service: General;  Laterality: N/A;  . ORIF HUMERUS FRACTURE Right 07/08/2015   Procedure: OPEN REDUCTION INTERNAL FIXATION (ORIF) HUMERAL SHAFT FRACTURE with bone graft and allograft;  Surgeon: Justice Britain, MD;  Location: [place];  Service: Orthopedics;  Laterality: Right;  . SKIN SURGERY     squamous cell     Current Meds  Medication Sig  . ALPRAZolam (XANAX) 0.5 MG tablet TAKE 1/2-1 TABLET BY MOUTH DAILY AS NEEDED  . Apoaequorin (PREVAGEN) 10 MG CAPS Take 10 mg by mouth daily.  Marland Kitchen aspirin 81 MG EC tablet Take 81 mg by mouth daily.   . cetirizine (ZYRTEC ALLERGY) 10 MG tablet Take 10 mg by mouth daily.  Marland Kitchen  Cholecalciferol (VITAMIN D) 50 MCG (2000 UT) tablet Take 2,000 Units by mouth daily.  Marland Kitchen diltiazem (CARDIZEM) 30 MG tablet TAKE ONE TABLET 3 TIMES DAILY AS NEEDED  . fluticasone (FLONASE) 50 MCG/ACT nasal spray 2 SPRAYS INTO BOTH NOSTRILS EVERY DAY  . Ginger, Zingiber officinalis, (GINGER ROOT PO) Take by mouth daily.   Marland Kitchen latanoprost (XALATAN) 0.005 % ophthalmic solution Place 1 drop into both eyes at bedtime.  . Latanoprost 0.005 % EMUL Apply 1 drop to eye daily.  . Magnesium 500 MG CAPS Take by mouth daily.   Marland Kitchen OVER THE COUNTER MEDICATION Zinc and Elderberry-Take 1 capsule by mouth  daily.  Marland Kitchen POTASSIUM PO Take by mouth daily.  Marland Kitchen Specialty Vitamins Products (ONE-A-DAY BONE STRENGTH PO) Take by mouth daily.  Marland Kitchen triamcinolone cream (KENALOG) 0.1 % Apply 1 application topically 2 (two) times daily.  . vitamin B-12 (CYANOCOBALAMIN) 1000 MCG tablet Take 1 tablet (1,000 mcg total) by mouth daily.  . vitamin C (ASCORBIC ACID) 500 MG tablet Take 500 mg by mouth daily.     Allergies:   Percocet [oxycodone-acetaminophen] and Vicodin [hydrocodone-acetaminophen]   Social History   Tobacco Use  . Smoking status: Former Smoker    Packs/day: 0.25    Years: 25.00    Pack years: 6.25    Quit date: 06/11/1990    Years since quitting: 29.4  . Smokeless tobacco: Never Used  Substance Use Topics  . Alcohol use: Yes    Alcohol/week: 7.0 standard drinks    Types: 7 Glasses of wine per week  . Drug use: No     Family Hx: The patient's family history includes Diabetes in her brother; Stroke (age of onset: 28) in her mother.  ROS:   Please see the history of present illness.    Review of Systems  Constitutional: Negative.   Respiratory: Negative.   Cardiovascular: Negative.   Gastrointestinal: Negative.   Musculoskeletal: Negative.   Neurological: Negative.   Psychiatric/Behavioral: Negative.   All other systems reviewed and are negative.    Labs/Other Tests and Data Reviewed:    Recent Labs: 03/18/2019: ALT 39; BUN 14; Creatinine, Ser 0.62; Hemoglobin 14.0; Platelets 290; Potassium 4.2; Sodium 140   Recent Lipid Panel Lab Results  Component Value Date/Time   CHOL 170 12/11/2018 10:54 AM   TRIG 90 12/11/2018 10:54 AM   HDL 61 12/11/2018 10:54 AM   CHOLHDL 2.8 12/11/2018 10:54 AM   CHOLHDL 4.0 Ratio 08/24/2009 08:24 PM   LDLCALC 91 12/11/2018 10:54 AM    Wt Readings from Last 3 Encounters:  11/03/19 132 lb (59.9 kg)  07/14/19 135 lb (61.2 kg)  03/18/19 136 lb 6.4 oz (61.9 kg)     Exam:    Vital Signs: Vital signs may also be detailed in the HPI BP (!)  150/70 (BP Location: Left Arm, Patient Position: Sitting, Cuff Size: Normal)   Pulse 67   Ht 5\' 1"  (1.549 m)   Wt 132 lb (59.9 kg)   SpO2 98%   BMI 24.94 kg/m    Constitutional:  oriented to person, place, and time. No distress.  HENT:  Head: Grossly normal Eyes:  no discharge. No scleral icterus.  Neck: No JVD, no carotid bruits  Cardiovascular: Regular rate and rhythm, no murmurs appreciated Pulmonary/Chest: Clear to auscultation bilaterally, no wheezes or rails Abdominal: Soft.  no distension.  no tenderness.  Musculoskeletal: Normal range of motion Neurological:  normal muscle tone. Coordination normal. No atrophy Skin: Skin warm and dry Psychiatric: normal  affect, pleasant   ASSESSMENT & PLAN:    Aortic atherosclerosis (HCC) Tolerating pravastatin 20 mg daily no side effects Lipids and LFTs ordered today  Frequent PVCs Diltiazem as needed, If getting worse, could use metoprolol succinate low dose She is asymptomatic  Chest pain, unspecified type Prior anxiety attack,  Doing well  No further workup   Hypercholesteremia Pravastatin as above, will continue Labs ordered today  Anxiety Stable, spending time at the beach Not on Xanax   Total encounter time more than 25 minutes  Greater than 50% was spent in counseling and coordination of care with the patient   Disposition: Follow-up in 12 months   Signed, Ida Rogue, MD  11/03/2019 11:13 AM    [place] [facility] [id number], [place], [place] [address]

## 2019-11-03 ENCOUNTER — Other Ambulatory Visit: Payer: Self-pay

## 2019-11-03 ENCOUNTER — Ambulatory Visit (INDEPENDENT_AMBULATORY_CARE_PROVIDER_SITE_OTHER): Payer: PPO | Admitting: Cardiovascular Disease

## 2019-11-03 ENCOUNTER — Encounter: Payer: Self-pay | Admitting: Cardiovascular Disease

## 2019-11-03 VITALS — BP 150/70 | HR 67 | Ht 61.0 in | Wt 132.0 lb

## 2019-11-03 DIAGNOSIS — E7800 Pure hypercholesterolemia, unspecified: Secondary | ICD-10-CM | POA: Diagnosis not present

## 2019-11-03 DIAGNOSIS — I493 Ventricular premature depolarization: Secondary | ICD-10-CM | POA: Diagnosis not present

## 2019-11-03 DIAGNOSIS — I700 Atherosclerosis of aorta: Secondary | ICD-10-CM | POA: Diagnosis not present

## 2019-11-03 DIAGNOSIS — R079 Chest pain, unspecified: Secondary | ICD-10-CM | POA: Diagnosis not present

## 2019-11-03 DIAGNOSIS — F419 Anxiety disorder, unspecified: Secondary | ICD-10-CM | POA: Diagnosis not present

## 2019-11-03 NOTE — Patient Instructions (Addendum)
Medication Instructions:  No changes  If you need a refill on your cardiac medications before your next appointment, please call your pharmacy.    Lab work: LIPID, LFT   If you have labs (blood work) drawn today and your tests are completely normal, you will receive your results only by: Marland Kitchen MyChart Message (if you have MyChart) OR . A paper copy in the mail If you have any lab test that is abnormal or we need to change your treatment, we will call you to review the results.   Testing/Procedures: No new testing needed   Follow-Up: At [facility], you and your health needs are our priority.  As part of our continuing mission to provide you with exceptional heart care, we have created designated Provider Care Teams.  These Care Teams include your primary Cardiologist (physician) and Advanced Practice Providers (APPs -  Physician Assistants and Nurse Practitioners) who all work together to provide you with the care you need, when you need it.  . You will need a follow up appointment in 12 months   . Providers on your designated Care Team:   . Murray Hodgkins, NP . Christell Faith, [name] . Marrianne Mood, [name]  Any Other Special Instructions Will Be Listed Below (If Applicable).  For educational health videos Log in to : [email or web address] Or : [email or web address], password : [email or web address]

## 2019-11-04 LAB — LIPID PANEL
Chol/HDL Ratio: 3.2 ratio (ref 0.0–4.4)
Cholesterol, Total: 194 mg/dL (ref 100–199)
HDL: 60 mg/dL (ref >39)
LDL Chol Calc (NIH): 109 mg/dL — ABNORMAL HIGH (ref 0–99)
Triglycerides: 146 mg/dL (ref 0–149)
VLDL Cholesterol Cal: 25 mg/dL (ref 5–40)

## 2019-11-04 LAB — HEPATIC FUNCTION PANEL
ALT: 20 IU/L (ref 0–32)
AST: 23 IU/L (ref 0–40)
Albumin: 4.8 g/dL — ABNORMAL HIGH (ref 3.7–4.7)
Alkaline Phosphatase: 92 IU/L (ref 48–121)
Bilirubin Total: 0.5 mg/dL (ref 0.0–1.2)
Bilirubin, Direct: 0.13 mg/dL (ref 0.00–0.40)
Total Protein: 7.1 g/dL (ref 6.0–8.5)

## 2019-11-07 DIAGNOSIS — H401131 Primary open-angle glaucoma, bilateral, mild stage: Secondary | ICD-10-CM | POA: Diagnosis not present

## 2019-11-07 DIAGNOSIS — H2512 Age-related nuclear cataract, left eye: Secondary | ICD-10-CM | POA: Diagnosis not present

## 2019-11-07 DIAGNOSIS — H2513 Age-related nuclear cataract, bilateral: Secondary | ICD-10-CM | POA: Diagnosis not present

## 2019-11-07 DIAGNOSIS — H04123 Dry eye syndrome of bilateral lacrimal glands: Secondary | ICD-10-CM | POA: Diagnosis not present

## 2019-11-07 DIAGNOSIS — H25013 Cortical age-related cataract, bilateral: Secondary | ICD-10-CM | POA: Diagnosis not present

## 2019-11-07 DIAGNOSIS — H25043 Posterior subcapsular polar age-related cataract, bilateral: Secondary | ICD-10-CM | POA: Diagnosis not present

## 2019-11-24 ENCOUNTER — Telehealth: Payer: Self-pay | Admitting: *Deleted

## 2019-11-24 NOTE — Telephone Encounter (Signed)
No answer. No voicemail. 

## 2019-11-24 NOTE — Telephone Encounter (Signed)
-----   Message from Minna Merritts, MD sent at 11/23/2019  5:45 PM EDT ----- Improved compared to her baseline Typically 250 now down to 190 We will leave it up to her whether she would like to retry Zetia 10 mg daily.  It will drop it another 20%, goal total cholesterol 150

## 2019-11-26 MED ORDER — EZETIMIBE 10 MG PO TABS: 10.0000 mg | ORAL_TABLET | Freq: Every day | ORAL | 3 refills | Status: DC

## 2019-11-26 NOTE — Telephone Encounter (Signed)
Spoke with patient and reviewed provider recommendations to start Zetia. Also discussed cost of this medication and offered to call her pharmacy to check on price. [facility] price at [facility] was roughly $25 for 90 day supply, with her insurance it would be more at roughly $30, and at [facility] it is $20 for 90 day supply no insurance and no cash. Advised that I would send her My Chart message where I send it in for her to pick up. She was very appreciative for the call and assisting with sending it to cheapest place. Instructed her to please call if she should have any further questions and she verbalized understanding with no further questions at this time.

## 2020-01-15 ENCOUNTER — Other Ambulatory Visit: Payer: Self-pay | Admitting: Cardiovascular Disease

## 2020-01-15 ENCOUNTER — Other Ambulatory Visit: Payer: Self-pay | Admitting: Physician Assistant

## 2020-01-15 DIAGNOSIS — I10 Essential (primary) hypertension: Secondary | ICD-10-CM

## 2020-01-15 DIAGNOSIS — F419 Anxiety disorder, unspecified: Secondary | ICD-10-CM

## 2020-01-15 NOTE — Telephone Encounter (Signed)
Requested medication (s) are due for refill today -yes  Requested medication (s) are on the active medication list -yes  Future visit scheduled -yes  Last refill: 11/04/19  Notes to clinic: Request for non delegated Rx  Requested Prescriptions  Pending Prescriptions Disp Refills   ALPRAZolam (XANAX) 0.5 MG tablet [Pharmacy Med Name: ALPRAZOLAM 0.5 MG TAB] 30 tablet     Sig: TAKE 1/2 TO 1 TABLET BY MOUTH DAILY AS NEEDED      Not Delegated - Psychiatry:  Anxiolytics/Hypnotics Failed - 01/15/2020 11:23 AM      Failed - This refill cannot be delegated      Failed - Urine Drug Screen completed in last 360 days.      Failed - Valid encounter within last 6 months    Recent Outpatient Visits           6 months ago Phlebitis   [facility] [place], Clearnce Sorrel, [place]   1 year ago [place], Clearnce Sorrel, [place]   1 year ago [place], Dionne Bucy, [place]   1 year ago Medicare annual wellness visit, subsequent   [place], Clearnce Sorrel, [place]   2 years ago Transition of care performed with sharing of clinical summary   [place], Clearnce Sorrel, [name]       Future Appointments             In 1 month [place], Clearnce Sorrel, [name] [facility]             Signed Prescriptions Disp Refills   diltiazem (CARDIZEM) 30 MG tablet 180 tablet 0    Sig: TAKE ONE TABLET 3 TIMES DAILY AS NEEDED      Cardiovascular:  Calcium Channel Blockers Failed - 01/15/2020 11:23 AM      Failed - Last BP in normal range    BP Readings from Last 1 Encounters:  11/03/19 (!) 150/70          Failed - Valid encounter within last 6 months    Recent Outpatient Visits           6 months ago Phlebitis   [facility] [place], Clearnce Sorrel, [place]   1 year ago [place], Clearnce Sorrel, [place]   1 year ago [place], Dionne Bucy, MD   1 year ago Medicare annual wellness visit, subsequent   [place], Clearnce Sorrel, [place]   2 years ago Transition of care performed with sharing of clinical summary   [place], Clearnce Sorrel, [place]       Future Appointments             In 1 month [place], Clearnce Sorrel, [name] [employer], [place]                Requested Prescriptions  Pending Prescriptions Disp Refills   ALPRAZolam (XANAX) 0.5 MG tablet [Pharmacy Med Name: ALPRAZOLAM 0.5 MG TAB] 30 tablet     Sig: TAKE 1/2 TO 1 TABLET BY MOUTH DAILY AS NEEDED      Not Delegated - Psychiatry:  Anxiolytics/Hypnotics Failed - 01/15/2020 11:23 AM      Failed - This refill cannot be delegated      Failed - Urine Drug Screen completed in last 360 days.      Failed - Valid encounter within last  6 months    Recent Outpatient Visits           6 months ago Phlebitis   [facility] [place], Clearnce Sorrel, [place]   1 year ago [place], Clearnce Sorrel, [place]   1 year ago [place], Dionne Bucy, MD   1 year ago Medicare annual wellness visit, subsequent   [place], Clearnce Sorrel, [place]   2 years ago Transition of care performed with sharing of clinical summary   [place], Clearnce Sorrel, [place]       Future Appointments             In 1 month [place], Clearnce Sorrel, [name] [employer]             Signed Prescriptions Disp Refills   diltiazem (CARDIZEM) 30 MG tablet 180 tablet 0    Sig: TAKE ONE TABLET 3 TIMES DAILY AS NEEDED      Cardiovascular:  Calcium Channel Blockers Failed - 01/15/2020 11:23 AM      Failed - Last BP in normal range    BP Readings from Last 1 Encounters:  11/03/19 (!) 150/70          Failed - Valid encounter within last 6 months    Recent  Outpatient Visits           6 months ago Phlebitis   [facility] [place], Clearnce Sorrel, [place]   1 year ago [place], Clearnce Sorrel, [place]   1 year ago [place], Dionne Bucy, MD   1 year ago Medicare annual wellness visit, subsequent   [place], Clearnce Sorrel, [place]   2 years ago Transition of care performed with sharing of clinical summary   [place], Clearnce Sorrel, [place]       Future Appointments             In 1 month [place], Clearnce Sorrel, [name] [employer] [employer], [place]

## 2020-01-15 NOTE — Telephone Encounter (Signed)
Call to patient- scheduled follow up-9/7 ( patient is out of town until then.

## 2020-02-09 DIAGNOSIS — H2512 Age-related nuclear cataract, left eye: Secondary | ICD-10-CM | POA: Diagnosis not present

## 2020-02-09 DIAGNOSIS — H2513 Age-related nuclear cataract, bilateral: Secondary | ICD-10-CM | POA: Diagnosis not present

## 2020-02-17 ENCOUNTER — Encounter: Payer: Self-pay | Admitting: Physician Assistant

## 2020-02-17 ENCOUNTER — Ambulatory Visit (INDEPENDENT_AMBULATORY_CARE_PROVIDER_SITE_OTHER): Payer: PPO | Admitting: Physician Assistant

## 2020-02-17 ENCOUNTER — Other Ambulatory Visit: Payer: Self-pay

## 2020-02-17 VITALS — BP 145/84 | HR 86 | Temp 98.4°F | Resp 16 | Wt 135.0 lb

## 2020-02-17 DIAGNOSIS — F419 Anxiety disorder, unspecified: Secondary | ICD-10-CM | POA: Diagnosis not present

## 2020-02-17 NOTE — Patient Instructions (Signed)
10 Relaxation Techniques That Zap Stress Fast By Jeannette Moninger   Listen  Relax. You deserve it, it's good for you, and it takes less time than you think. You don't need a spa weekend or a retreat. Each of these stress-relieving tips can get you from OMG to om in less than 15 minutes. 1. Meditate  A few minutes of practice per day can help ease anxiety. "Research suggests that daily meditation may alter the brain's neural pathways, making you more resilient to stress," says psychologist Robbie Maller Hartman, PhD, a [place] health and wellness coach. It's simple. Sit up straight with both feet on the floor. Close your eyes. Focus your attention on reciting -- out loud or silently -- a positive mantra such as "I feel at peace" or "I love myself." Place one hand on your belly to sync the mantra with your breaths. Let any distracting thoughts float by like clouds. 2. Breathe Deeply  Take a 5-minute break and focus on your breathing. Sit up straight, eyes closed, with a hand on your belly. Slowly inhale through your nose, feeling the breath start in your abdomen and work its way to the top of your head. Reverse the process as you exhale through your mouth.  "Deep breathing counters the effects of stress by slowing the heart rate and lowering blood pressure," psychologist Judith Tutin, PhD, says. She's a certified life coach in [place], [place] 3. Be Present  Slow down.  "Take 5 minutes and focus on only one behavior with awareness," Tutin says. Notice how the air feels on your face when you're walking and how your feet feel hitting the ground. Enjoy the texture and taste of each bite of food. When you spend time in the moment and focus on your senses, you should feel less tense. 4. Reach Out  Your social network is one of your best tools for handling stress. Talk to others -- preferably face to face, or at least on the phone. Share what's going on. You can get a fresh perspective while keeping your  connection strong. 5. Tune In to Your Body  Mentally scan your body to get a sense of how stress affects it each day. Lie on your back, or sit with your feet on the floor. Start at your toes and work your way up to your scalp, noticing how your body feels.  "Simply be aware of places you feel tight or loose without trying to change anything," Tutin says. For 1 to 2 minutes, imagine each deep breath flowing to that body part. Repeat this process as you move your focus up your body, paying close attention to sensations you feel in each body part. 6. Decompress  Place a warm heat wrap around your neck and shoulders for 10 minutes. Close your eyes and relax your face, neck, upper chest, and back muscles. Remove the wrap, and use a tennis ball or foam roller to massage away tension.  "Place the ball between your back and the wall. Lean into the ball, and hold gentle pressure for up to 15 seconds. Then move the ball to another spot, and apply pressure," says Cathy Benninger, a nurse practitioner and assistant professor at [employer] [facility] in [place]. 7. Laugh Out Loud  A good belly laugh doesn't just lighten the load mentally. It lowers cortisol, your body's stress hormone, and boosts brain chemicals called endorphins, which help your mood. Lighten up by tuning in to your favorite sitcom or video, reading   the comics, or chatting with someone who makes you smile. 8. Crank Up the Tunes  Research shows that listening to soothing music can lower blood pressure, heart rate, and anxiety. "Create a playlist of songs or nature sounds (the ocean, a bubbling brook, birds chirping), and allow your mind to focus on the different melodies, instruments, or singers in the piece," Benninger says. You also can blow off steam by rocking out to more upbeat tunes -- or singing at the top of your lungs! 9. Get Moving  You don't have to run in order to get a runner's high. All forms of exercise,  including yoga and walking, can ease depression and anxiety by helping the brain release feel-good chemicals and by giving your body a chance to practice dealing with stress. You can go for a quick walk around the block, take the stairs up and down a few flights, or do some stretching exercises like head rolls and shoulder shrugs. 10. Be Grateful  Keep a gratitude journal or several (one by your bed, one in your purse, and one at work) to help you remember all the things that are good in your life.  "Being grateful for your blessings cancels out negative thoughts and worries," says Joni Emmerling, a wellness coach in [place], [place].  Use these journals to savor good experiences like a child's smile, a sunshine-filled day, and good health. Don't forget to celebrate accomplishments like mastering a new task at work or a new hobby. When you start feeling stressed, spend a few minutes looking through your notes to remind yourself what really matters.   

## 2020-02-17 NOTE — Progress Notes (Signed)
I,Isabel Gonzalez,acting as a scribe for [employer], [name].,have documented all relevant documentation on the behalf of Isabel Daring, [name],as directed by  Isabel Daring, [name] while in the presence of Isabel Daring, [name].   Established patient visit   Patient: Isabel Gonzalez   DOB: 1945/07/04   74 y.o. Female  MRN: [id number] Visit Date: 02/17/2020  Today's healthcare provider: [name], [name]   Chief Complaint  Patient presents with  . Follow-up   Subjective    HPI  Anxiety From 07/14/2019-Stable. Diagnosis pulled for medication refill. Continue current medical treatment plan. Reports she is doing well. Has no complaints. Needs refill of her alprazolam.    Patient Active Problem List   Diagnosis Date Noted  . Encounter for counseling 12/02/2018  . Aortic atherosclerosis ([place]) 03/14/2017  . Ileus, postoperative ([place]) 02/26/2017  . Small bowel obstruction ([place])   . 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 B12 deficiency 03/27/2016  . Humerus fracture 03/19/2015    Class: Status post  . Abdominal pain 03/18/2015  . Allergic rhinitis 03/18/2015  . Anxiety 03/18/2015  . Arthritis 03/18/2015  . Body mass index (BMI) of 23.0-23.9 in adult 03/18/2015  . Bursitis 03/18/2015  . Family history of diabetes mellitus 03/18/2015  . Acid reflux 03/18/2015  . Glaucoma 03/18/2015  . Hypercholesteremia 03/18/2015  . Blood glucose elevated 03/18/2015  . Headache, migraine 03/18/2015  . Awareness of heartbeats 03/18/2015  . Chest pain 01/23/2012  . PALPITATIONS 08/23/2009   Past Medical History:  Diagnosis Date  . Anemia   . Anxiety   . Arthritis   . Cancer (HCC)    squamous cell  . Constipation   . Dysrhythmia    palpitations   . Environmental allergies   . GERD (gastroesophageal reflux disease)   . History of bladder infections   . Hyperlipidemia   . Murmur   . Urinary problem        Medications: Outpatient Medications Prior to Visit    Medication Sig  . ALPRAZolam (XANAX) 0.5 MG tablet TAKE 1/2 TO 1 TABLET BY MOUTH DAILY AS NEEDED  . Apoaequorin (PREVAGEN) 10 MG CAPS Take 10 mg by mouth daily.  Marland Kitchen aspirin 81 MG EC tablet Take 81 mg by mouth daily.   . cetirizine (ZYRTEC ALLERGY) 10 MG tablet Take 10 mg by mouth daily.  . Cholecalciferol (VITAMIN D) 50 MCG (2000 UT) tablet Take 2,000 Units by mouth daily.  Marland Kitchen diltiazem (CARDIZEM) 30 MG tablet TAKE ONE TABLET 3 TIMES DAILY AS NEEDED  . ezetimibe (ZETIA) 10 MG tablet Take 1 tablet (10 mg total) by mouth daily.  . fluticasone (FLONASE) 50 MCG/ACT nasal spray 2 SPRAYS INTO BOTH NOSTRILS EVERY DAY  . Ginger, Zingiber officinalis, (GINGER ROOT PO) Take by mouth daily.   Marland Kitchen latanoprost (XALATAN) 0.005 % ophthalmic solution Place 1 drop into both eyes at bedtime.  . Latanoprost 0.005 % EMUL Apply 1 drop to eye daily.  . Magnesium 500 MG CAPS Take by mouth daily.   Marland Kitchen OVER THE COUNTER MEDICATION Zinc and Elderberry-Take 1 capsule by mouth daily.  Marland Kitchen POTASSIUM PO Take by mouth daily.  . pravastatin (PRAVACHOL) 20 MG tablet TAKE ONE TABLET EVERY EVENING  . Specialty Vitamins Products (ONE-A-DAY BONE STRENGTH PO) Take by mouth daily.  Marland Kitchen triamcinolone cream (KENALOG) 0.1 % Apply 1 application topically 2 (two) times daily.  . vitamin B-12 (CYANOCOBALAMIN) 1000 MCG tablet Take 1 tablet (1,000 mcg total) by mouth daily.  Marland Kitchen  vitamin C (ASCORBIC ACID) 500 MG tablet Take 500 mg by mouth daily.   No facility-administered medications prior to visit.    Review of Systems  Constitutional: Negative for appetite change, chills, fatigue and fever.  Respiratory: Negative for chest tightness and shortness of breath.   Cardiovascular: Negative for chest pain and palpitations.  Gastrointestinal: Negative for abdominal pain, nausea and vomiting.  Neurological: Negative for dizziness and weakness.  Psychiatric/Behavioral: Negative for dysphoric mood. The patient is not nervous/anxious.     Last  CBC Lab Results  Component Value Date   WBC 8.1 03/18/2019   HGB 14.0 03/18/2019   HCT 42.6 03/18/2019   MCV 95 03/18/2019   MCH 31.0 03/18/2019   RDW 11.9 03/18/2019   PLT 290 59/93/5701   Last metabolic panel Lab Results  Component Value Date   GLUCOSE 91 03/18/2019   NA 140 03/18/2019   K 4.2 03/18/2019   CL 99 03/18/2019   CO2 25 03/18/2019   BUN 14 03/18/2019   CREATININE 0.62 03/18/2019   GFRNONAA 89 03/18/2019   GFRAA 103 03/18/2019   CALCIUM 10.0 03/18/2019   PHOS 2.0 (L) 02/27/2017   PROT 7.1 11/03/2019   ALBUMIN 4.8 (H) 11/03/2019   LABGLOB 2.4 03/18/2019   AGRATIO 1.9 03/18/2019   BILITOT 0.5 11/03/2019   ALKPHOS 92 11/03/2019   AST 23 11/03/2019   ALT 20 11/03/2019   ANIONGAP 5 02/28/2017      Objective    There were no vitals taken for this visit. BP Readings from Last 3 Encounters:  02/17/20 (!) 145/84  11/03/19 (!) 150/70  07/14/19 (!) 154/94   Wt Readings from Last 3 Encounters:  02/17/20 135 lb (61.2 kg)  11/03/19 132 lb (59.9 kg)  07/14/19 135 lb (61.2 kg)      Physical Exam Vitals reviewed.  Constitutional:      General: She is not in acute distress.    Appearance: Normal appearance. She is well-developed and normal weight. She is not ill-appearing or diaphoretic.  Cardiovascular:     Rate and Rhythm: Normal rate and regular rhythm.     Heart sounds: Normal heart sounds. No murmur heard.  No friction rub. No gallop.   Pulmonary:     Effort: Pulmonary effort is normal. No respiratory distress.     Breath sounds: Normal breath sounds. No wheezing or rales.  Musculoskeletal:     Cervical back: Normal range of motion and neck supple.  Neurological:     Mental Status: She is alert.  Psychiatric:        Mood and Affect: Mood normal.        Thought Content: Thought content normal.      No results found for any visits on 02/17/20.  Assessment & Plan     1. Anxiety Doing well. Uses alprazolam prn for sleep. Alprazolam was  refilled on 01/15/20 with 5 refills so no refill required at this time. Call if symptoms worsen.    No follow-ups on file.      Isabel Bowl, [name], have reviewed all documentation for this visit. The documentation on 02/17/20 for the exam, diagnosis, procedures, and orders are all accurate and complete.   Rubye Beach  [facility] [phone] (phone) [phone] (fax)  [place]

## 2020-02-19 DIAGNOSIS — H2512 Age-related nuclear cataract, left eye: Secondary | ICD-10-CM | POA: Diagnosis not present

## 2020-02-19 DIAGNOSIS — H52202 Unspecified astigmatism, left eye: Secondary | ICD-10-CM | POA: Diagnosis not present

## 2020-02-19 DIAGNOSIS — H401122 Primary open-angle glaucoma, left eye, moderate stage: Secondary | ICD-10-CM | POA: Diagnosis not present

## 2020-02-19 DIAGNOSIS — H401121 Primary open-angle glaucoma, left eye, mild stage: Secondary | ICD-10-CM | POA: Diagnosis not present

## 2020-02-20 ENCOUNTER — Telehealth: Payer: Self-pay

## 2020-02-20 DIAGNOSIS — H2511 Age-related nuclear cataract, right eye: Secondary | ICD-10-CM | POA: Diagnosis not present

## 2020-02-20 NOTE — Telephone Encounter (Signed)
Copied from [place] [id number]. Topic: General - Inquiry >> Feb 19, 2020  1:50 PM Gillis Ends D wrote: Reason for CRM: Patient states that she was exposed to Covid this past weekend because her grand daughter is now positive for Covid . Mrs. Brull would like to come in for a Covid Test. She had Eye surgery this morning and have to return to an appointment in the morning, she has no symptoms as of now. Contact the patient at [phone].Please advise the patient

## 2020-02-27 ENCOUNTER — Telehealth: Payer: Self-pay | Admitting: Physician Assistant

## 2020-02-27 NOTE — Telephone Encounter (Signed)
Copied from [place] [id number]. Topic: Medicare AWV >> Feb 27, 2020  2:06 PM Cher Nakai R wrote: Reason for CRM:  Left message for patient to call back and schedule Medicare Annual Wellness Visit (AWV) either virtually or in office.  Last AWV 02/19/2019  Please schedule at anytime with [facility] Advisor.  If any questions, please contact me at [phone]

## 2020-03-04 DIAGNOSIS — H401111 Primary open-angle glaucoma, right eye, mild stage: Secondary | ICD-10-CM | POA: Diagnosis not present

## 2020-03-04 DIAGNOSIS — H2511 Age-related nuclear cataract, right eye: Secondary | ICD-10-CM | POA: Diagnosis not present

## 2020-03-15 ENCOUNTER — Other Ambulatory Visit: Payer: Self-pay | Admitting: *Deleted

## 2020-03-15 ENCOUNTER — Other Ambulatory Visit: Payer: Self-pay | Admitting: Physician Assistant

## 2020-03-15 DIAGNOSIS — J301 Allergic rhinitis due to pollen: Secondary | ICD-10-CM

## 2020-03-15 NOTE — Telephone Encounter (Signed)
Approved per protocol.  

## 2020-03-23 ENCOUNTER — Telehealth: Payer: Self-pay

## 2020-03-23 NOTE — Telephone Encounter (Signed)
Copied from [place] [id number]. Topic: General - Call Back - No Documentation >> Mar 22, 2020  4:46 PM Erick Blinks wrote: Best contact: [phone]  Pt wants covid vaccine, she had a reaction to her second dose. She needs to be monitored following vaccine.

## 2020-03-26 NOTE — Telephone Encounter (Signed)
LMTCB and schedule for covid booster

## 2020-03-26 NOTE — Telephone Encounter (Signed)
Left vm x2.

## 2020-03-29 NOTE — Telephone Encounter (Signed)
Left voicemail to call office so that we can schedule covid vaccine.

## 2020-04-12 ENCOUNTER — Ambulatory Visit: Payer: PPO | Attending: Internal Medicine

## 2020-04-12 ENCOUNTER — Ambulatory Visit: Payer: PPO

## 2020-04-12 DIAGNOSIS — Z23 Encounter for immunization: Secondary | ICD-10-CM

## 2020-04-12 NOTE — Progress Notes (Signed)
   Covid-19 Vaccination Clinic  Name:  Yocheved Depner    MRN: [id number] DOB: 1946/01/14  04/12/2020  Ms. Treloar was observed post Covid-19 immunization for 30 minutes based on pre-vaccination screening without incident. She was provided with Vaccine Information Sheet and instruction to access the V-Safe system.   Ms. Bastone was instructed to call 911 with any severe reactions post vaccine: Marland Kitchen Difficulty breathing  . Swelling of face and throat  . A fast heartbeat  . A bad rash all over body  . Dizziness and weakness

## 2020-04-19 ENCOUNTER — Telehealth (INDEPENDENT_AMBULATORY_CARE_PROVIDER_SITE_OTHER): Payer: PPO | Admitting: Physician Assistant

## 2020-04-19 ENCOUNTER — Telehealth: Payer: Self-pay

## 2020-04-19 ENCOUNTER — Encounter: Payer: Self-pay | Admitting: Physician Assistant

## 2020-04-19 DIAGNOSIS — F411 Generalized anxiety disorder: Secondary | ICD-10-CM | POA: Diagnosis not present

## 2020-04-19 DIAGNOSIS — F321 Major depressive disorder, single episode, moderate: Secondary | ICD-10-CM

## 2020-04-19 MED ORDER — ESCITALOPRAM OXALATE 10 MG PO TABS: ORAL_TABLET | ORAL | 1 refills | Status: DC

## 2020-04-19 NOTE — Progress Notes (Signed)
MyChart Video Visit    Virtual Visit via Video Note   This visit type was conducted due to national recommendations for restrictions regarding the COVID-19 Pandemic (e.g. social distancing) in an effort to limit this patient's exposure and mitigate transmission in our community. This patient is at least at moderate risk for complications without adequate follow up. This format is felt to be most appropriate for this patient at this time. Physical exam was limited by quality of the video and audio technology used for the visit.   Patient location: Home Provider location: [facility]  I discussed the limitations of evaluation and management by telemedicine and the availability of in person appointments. The patient expressed understanding and agreed to proceed.  Patient: Isabel Gonzalez   DOB: 1946-02-17   74 y.o. Female  MRN: [id number] Visit Date: 04/19/2020  Today's healthcare provider: [name], [name]   Chief Complaint  Patient presents with  . Depression   Subjective    HPI  Depression: Reports that for past three months she has been feeling sad, stressed and she doesn't really want to go out of the house, when normally she is very social. She is having terrible dreams at night. She also has been having this panic feeling. She reports that when she takes the xanax that it helps for a little bit but then it doesn't.  Reports that her BP goes up 165/70's when she starts to feel this way other than that her BP is in the 120/70 or lower.  Patient Active Problem List   Diagnosis Date Noted  . Encounter for counseling 12/02/2018  . Aortic atherosclerosis ([place]) 03/14/2017  . Ileus, postoperative ([place]) 02/26/2017  . Small bowel obstruction ([place])   . 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 B12 deficiency 03/27/2016  . Humerus fracture 03/19/2015    Class: Status post  . Abdominal pain 03/18/2015  . Allergic rhinitis 03/18/2015  . Anxiety 03/18/2015  . Arthritis 03/18/2015  . Body  mass index (BMI) of 23.0-23.9 in adult 03/18/2015  . Bursitis 03/18/2015  . Family history of diabetes mellitus 03/18/2015  . Acid reflux 03/18/2015  . Glaucoma 03/18/2015  . Hypercholesteremia 03/18/2015  . Blood glucose elevated 03/18/2015  . Headache, migraine 03/18/2015  . Awareness of heartbeats 03/18/2015  . Chest pain 01/23/2012  . PALPITATIONS 08/23/2009   Past Medical History:  Diagnosis Date  . Anemia   . Anxiety   . Arthritis   . Cancer (HCC)    squamous cell  . Constipation   . Dysrhythmia    palpitations   . Environmental allergies   . GERD (gastroesophageal reflux disease)   . History of bladder infections   . Hyperlipidemia   . Murmur   . Urinary problem       Medications: Outpatient Medications Prior to Visit  Medication Sig  . ALPRAZolam (XANAX) 0.5 MG tablet TAKE 1/2 TO 1 TABLET BY MOUTH DAILY AS NEEDED  . Apoaequorin (PREVAGEN) 10 MG CAPS Take 10 mg by mouth daily.  Marland Kitchen aspirin 81 MG EC tablet Take 81 mg by mouth daily.   . cetirizine (ZYRTEC ALLERGY) 10 MG tablet Take 10 mg by mouth daily.  . Cholecalciferol (VITAMIN D) 50 MCG (2000 UT) tablet Take 2,000 Units by mouth daily.  Marland Kitchen diltiazem (CARDIZEM) 30 MG tablet TAKE ONE TABLET 3 TIMES DAILY AS NEEDED  . fluticasone (FLONASE) 50 MCG/ACT nasal spray 2 SPRAYS INTO BOTH NOSTRILS EVERY DAY  . Ginger, Zingiber officinalis, (GINGER ROOT PO) Take  by mouth daily.   Marland Kitchen latanoprost (XALATAN) 0.005 % ophthalmic solution Place 1 drop into both eyes at bedtime.  . Latanoprost 0.005 % EMUL Apply 1 drop to eye daily.  . Magnesium 500 MG CAPS Take by mouth daily.   Marland Kitchen OVER THE COUNTER MEDICATION Zinc and Elderberry-Take 1 capsule by mouth daily.  Marland Kitchen POTASSIUM PO Take by mouth daily.  . pravastatin (PRAVACHOL) 20 MG tablet TAKE ONE TABLET EVERY EVENING  . Specialty Vitamins Products (ONE-A-DAY BONE STRENGTH PO) Take by mouth daily.  Marland Kitchen triamcinolone cream (KENALOG) 0.1 % Apply 1 application topically 2 (two) times daily.   . vitamin B-12 (CYANOCOBALAMIN) 1000 MCG tablet Take 1 tablet (1,000 mcg total) by mouth daily.  . vitamin C (ASCORBIC ACID) 500 MG tablet Take 500 mg by mouth daily.  Marland Kitchen ezetimibe (ZETIA) 10 MG tablet Take 1 tablet (10 mg total) by mouth daily.   No facility-administered medications prior to visit.    Review of Systems  Last CBC Lab Results  Component Value Date   WBC 8.1 03/18/2019   HGB 14.0 03/18/2019   HCT 42.6 03/18/2019   MCV 95 03/18/2019   MCH 31.0 03/18/2019   RDW 11.9 03/18/2019   PLT 290 05/69/7948   Last metabolic panel Lab Results  Component Value Date   GLUCOSE 91 03/18/2019   NA 140 03/18/2019   K 4.2 03/18/2019   CL 99 03/18/2019   CO2 25 03/18/2019   BUN 14 03/18/2019   CREATININE 0.62 03/18/2019   GFRNONAA 89 03/18/2019   GFRAA 103 03/18/2019   CALCIUM 10.0 03/18/2019   PHOS 2.0 (L) 02/27/2017   PROT 7.1 11/03/2019   ALBUMIN 4.8 (H) 11/03/2019   LABGLOB 2.4 03/18/2019   AGRATIO 1.9 03/18/2019   BILITOT 0.5 11/03/2019   ALKPHOS 92 11/03/2019   AST 23 11/03/2019   ALT 20 11/03/2019   ANIONGAP 5 02/28/2017      Objective    There were no vitals taken for this visit. BP Readings from Last 3 Encounters:  02/17/20 (!) 145/84  11/03/19 (!) 150/70  07/14/19 (!) 154/94   Wt Readings from Last 3 Encounters:  02/17/20 135 lb (61.2 kg)  11/03/19 132 lb (59.9 kg)  07/14/19 135 lb (61.2 kg)      Physical Exam     Assessment & Plan     1. GAD (generalized anxiety disorder) Acutely worsening. Has been progressing since August but really escalated over the last 3 weeks. Will add lexapro as below. Continue alprazolam prn. Will f/u in 4-6 weeks. Call if adverse effect occurs. - escitalopram (LEXAPRO) 10 MG tablet; Start with 1/2 tab (5mg ) PO q hs x 1 week then increase 1 tab (10mg ) PO q hs  Dispense: 30 tablet; Refill: 1  2. Depression, major, single episode, moderate ([place]) See above medical treatment plan. - escitalopram (LEXAPRO) 10 MG  tablet; Start with 1/2 tab (5mg ) PO q hs x 1 week then increase 1 tab (10mg ) PO q hs  Dispense: 30 tablet; Refill: 1   Return in about 4 weeks (around 05/17/2020), or if symptoms worsen or fail to improve.     I discussed the assessment and treatment plan with the patient. The patient was provided an opportunity to ask questions and all were answered. The patient agreed with the plan and demonstrated an understanding of the instructions.   The patient was advised to call back or seek an in-person evaluation if the symptoms worsen or if the condition fails to improve  as anticipated.  I provided 15 minutes of face-to-face time during this encounter via MyChart Video enabled encounter.  [name], [name], have reviewed all documentation for this visit. The documentation on 04/19/20 for the exam, diagnosis, procedures, and orders are all accurate and complete.  Rubye Beach [facility] [phone] (phone) [phone] (fax)  [place]

## 2020-04-19 NOTE — Telephone Encounter (Signed)
Patient advised.

## 2020-04-19 NOTE — Telephone Encounter (Signed)
Copied from [place] [phone]. Topic: General - Other >> Apr 19, 2020  3:21 PM Hinda Lenis D wrote: PT states she has glaucoma, asking if ok for her to take this medication / escitalopram (LEXAPRO) 10 MG tablet [[id number]] / please advise

## 2020-04-19 NOTE — Patient Instructions (Signed)
Escitalopram tablets What is this medicine? ESCITALOPRAM (es sye TAL oh pram) is used to treat depression and certain types of anxiety. This medicine may be used for other purposes; ask your health care provider or pharmacist if you have questions. COMMON BRAND NAME(S): Lexapro What should I tell my health care provider before I take this medicine? They need to know if you have any of these conditions:  bipolar disorder or a family history of bipolar disorder  diabetes  glaucoma  heart disease  kidney or liver disease  receiving electroconvulsive therapy  seizures (convulsions)  suicidal thoughts, plans, or attempt by you or a family member  an unusual or allergic reaction to escitalopram, the related drug citalopram, other medicines, foods, dyes, or preservatives  pregnant or trying to become pregnant  breast-feeding How should I use this medicine? Take this medicine by mouth with a glass of water. Follow the directions on the prescription label. You can take it with or without food. If it upsets your stomach, take it with food. Take your medicine at regular intervals. Do not take it more often than directed. Do not stop taking this medicine suddenly except upon the advice of your doctor. Stopping this medicine too quickly may cause serious side effects or your condition may worsen. A special MedGuide will be given to you by the pharmacist with each prescription and refill. Be sure to read this information carefully each time. Talk to your pediatrician regarding the use of this medicine in children. Special care may be needed. Overdosage: If you think you have taken too much of this medicine contact a poison control center or emergency room at once. NOTE: This medicine is only for you. Do not share this medicine with others. What if I miss a dose? If you miss a dose, take it as soon as you can. If it is almost time for your next dose, take only that dose. Do not take double or  extra doses. What may interact with this medicine? Do not take this medicine with any of the following medications:  certain medicines for fungal infections like fluconazole, itraconazole, ketoconazole, posaconazole, voriconazole  cisapride  citalopram  dronedarone  linezolid  MAOIs like Carbex, Eldepryl, Marplan, Nardil, and Parnate  methylene blue (injected into a vein)  pimozide  thioridazine This medicine may also interact with the following medications:  alcohol  amphetamines  aspirin and aspirin-like medicines  carbamazepine  certain medicines for depression, anxiety, or psychotic disturbances  certain medicines for migraine headache like almotriptan, eletriptan, frovatriptan, naratriptan, rizatriptan, sumatriptan, zolmitriptan  certain medicines for sleep  certain medicines that treat or prevent blood clots like warfarin, enoxaparin, dalteparin  cimetidine  diuretics  dofetilide  fentanyl  furazolidone  isoniazid  lithium  metoprolol  NSAIDs, medicines for pain and inflammation, like ibuprofen or naproxen  other medicines that prolong the QT interval (cause an abnormal heart rhythm)  procarbazine  rasagiline  supplements like St. John's wort, kava kava, valerian  tramadol  tryptophan  ziprasidone This list may not describe all possible interactions. Give your health care provider a list of all the medicines, herbs, non-prescription drugs, or dietary supplements you use. Also tell them if you smoke, drink alcohol, or use illegal drugs. Some items may interact with your medicine. What should I watch for while using this medicine? Tell your doctor if your symptoms do not get better or if they get worse. Visit your doctor or health care professional for regular checks on your progress. Because it may   take several weeks to see the full effects of this medicine, it is important to continue your treatment as prescribed by your doctor. Patients  and their families should watch out for new or worsening thoughts of suicide or depression. Also watch out for sudden changes in feelings such as feeling anxious, agitated, panicky, irritable, hostile, aggressive, impulsive, severely restless, overly excited and hyperactive, or not being able to sleep. If this happens, especially at the beginning of treatment or after a change in dose, call your health care professional. You may get drowsy or dizzy. Do not drive, use machinery, or do anything that needs mental alertness until you know how this medicine affects you. Do not stand or sit up quickly, especially if you are an older patient. This reduces the risk of dizzy or fainting spells. Alcohol may interfere with the effect of this medicine. Avoid alcoholic drinks. Your mouth may get dry. Chewing sugarless gum or sucking hard candy, and drinking plenty of water may help. Contact your doctor if the problem does not go away or is severe. What side effects may I notice from receiving this medicine? Side effects that you should report to your doctor or health care professional as soon as possible:  allergic reactions like skin rash, itching or hives, swelling of the face, lips, or tongue  anxious  black, tarry stools  changes in vision  confusion  elevated mood, decreased need for sleep, racing thoughts, impulsive behavior  eye pain  fast, irregular heartbeat  feeling faint or lightheaded, falls  feeling agitated, angry, or irritable  hallucination, loss of contact with reality  loss of balance or coordination  loss of memory  painful or prolonged erections  restlessness, pacing, inability to keep still  seizures  stiff muscles  suicidal thoughts or other mood changes  trouble sleeping  unusual bleeding or bruising  unusually weak or tired  vomiting Side effects that usually do not require medical attention (report to your doctor or health care professional if they  continue or are bothersome):  changes in appetite  change in sex drive or performance  headache  increased sweating  indigestion, nausea  tremors This list may not describe all possible side effects. Call your doctor for medical advice about side effects. You may report side effects to FDA at 1-800-FDA-1088. Where should I keep my medicine? Keep out of reach of children. Store at room temperature between 15 and 30 degrees C (59 and 86 degrees F). Throw away any unused medicine after the expiration date. NOTE: This sheet is a summary. It may not cover all possible information. If you have questions about this medicine, talk to your doctor, pharmacist, or health care provider.  2020 Elsevier/Gold Standard (2018-05-20 11:21:44)  

## 2020-04-19 NOTE — Telephone Encounter (Signed)
Should be ok but if she notices any visual changes when she starts she needs to stop immediately and call the office.

## 2020-05-17 ENCOUNTER — Encounter: Payer: Self-pay | Admitting: Physician Assistant

## 2020-05-17 ENCOUNTER — Telehealth (INDEPENDENT_AMBULATORY_CARE_PROVIDER_SITE_OTHER): Payer: PPO | Admitting: Physician Assistant

## 2020-05-17 DIAGNOSIS — F321 Major depressive disorder, single episode, moderate: Secondary | ICD-10-CM

## 2020-05-17 DIAGNOSIS — F411 Generalized anxiety disorder: Secondary | ICD-10-CM

## 2020-05-17 MED ORDER — ESCITALOPRAM OXALATE 10 MG PO TABS: 10.0000 mg | ORAL_TABLET | Freq: Every day | ORAL | 1 refills | Status: DC

## 2020-05-17 NOTE — Progress Notes (Signed)
MyChart Video Visit    Virtual Visit via Video Note   This visit type was conducted due to national recommendations for restrictions regarding the COVID-19 Pandemic (e.g. social distancing) in an effort to limit this patient's exposure and mitigate transmission in our community. This patient is at least at moderate risk for complications without adequate follow up. This format is felt to be most appropriate for this patient at this time. Physical exam was limited by quality of the video and audio technology used for the visit.   Patient location: Home Provider location: [facility]  Interactive audio and video communications were attempted, although failed due to patient's inability to connect to video. Continued visit with audio only interaction with patient agreement.  I discussed the limitations of evaluation and management by telemedicine and the availability of in person appointments. The patient expressed understanding and agreed to proceed.  Patient: Isabel Gonzalez   DOB: [birth year]   74 y.o. Female  MRN: [id number] Visit Date: 05/17/2020  Today's healthcare provider: [name], [name]   Chief Complaint  Patient presents with  . Follow-up   Subjective    HPI  Follow up for GAD and Depression  The patient was last seen for this 4-6 weeks ago. Changes made at last visit include continue Alprazolam prn. Will add Lexapro.  She reports excellent compliance with treatment. She feels that condition is Improved. She is not having side effects. Had mild dizziness the first 2 nights of taking, but that has completely resolved. Reports she is feeling great and feels like herself again.  -----------------------------------------------------------------------------------------   Patient Active Problem List   Diagnosis Date Noted  . Encounter for counseling 12/02/2018  . Aortic atherosclerosis ([place]) 03/14/2017  . Ileus, postoperative ([place]) 02/26/2017  . Small bowel  obstruction ([place])   . 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 B12 deficiency 03/27/2016  . Humerus fracture 03/19/2015    Class: Status post  . Abdominal pain 03/18/2015  . Allergic rhinitis 03/18/2015  . Anxiety 03/18/2015  . Arthritis 03/18/2015  . Body mass index (BMI) of 23.0-23.9 in adult 03/18/2015  . Bursitis 03/18/2015  . Family history of diabetes mellitus 03/18/2015  . Acid reflux 03/18/2015  . Glaucoma 03/18/2015  . Hypercholesteremia 03/18/2015  . Blood glucose elevated 03/18/2015  . Headache, migraine 03/18/2015  . Awareness of heartbeats 03/18/2015  . Chest pain 01/23/2012  . PALPITATIONS 08/23/2009   Past Medical History:  Diagnosis Date  . Anemia   . Anxiety   . Arthritis   . Cancer (HCC)    squamous cell  . Constipation   . Dysrhythmia    palpitations   . Environmental allergies   . GERD (gastroesophageal reflux disease)   . History of bladder infections   . Hyperlipidemia   . Murmur   . Urinary problem       Medications: Outpatient Medications Prior to Visit  Medication Sig  . ALPRAZolam (XANAX) 0.5 MG tablet TAKE 1/2 TO 1 TABLET BY MOUTH DAILY AS NEEDED  . Apoaequorin (PREVAGEN) 10 MG CAPS Take 10 mg by mouth daily.  Marland Kitchen aspirin 81 MG EC tablet Take 81 mg by mouth daily.   . cetirizine (ZYRTEC ALLERGY) 10 MG tablet Take 10 mg by mouth daily.  . Cholecalciferol (VITAMIN D) 50 MCG (2000 UT) tablet Take 2,000 Units by mouth daily.  Marland Kitchen diltiazem (CARDIZEM) 30 MG tablet TAKE ONE TABLET 3 TIMES DAILY AS NEEDED  . escitalopram (LEXAPRO) 10 MG tablet Start with 1/2 tab (5mg )  PO q hs x 1 week then increase 1 tab (10mg ) PO q hs  . ezetimibe (ZETIA) 10 MG tablet Take 1 tablet (10 mg total) by mouth daily.  . fluticasone (FLONASE) 50 MCG/ACT nasal spray 2 SPRAYS INTO BOTH NOSTRILS EVERY DAY  . Ginger, Zingiber officinalis, (GINGER ROOT PO) Take by mouth daily.   Marland Kitchen latanoprost (XALATAN) 0.005 % ophthalmic solution Place 1 drop into both eyes at bedtime.  . Latanoprost  0.005 % EMUL Apply 1 drop to eye daily.  . Magnesium 500 MG CAPS Take by mouth daily.   Marland Kitchen OVER THE COUNTER MEDICATION Elderberry-Take 1 capsule by mouth daily.  Marland Kitchen POTASSIUM PO Take by mouth daily.  . pravastatin (PRAVACHOL) 20 MG tablet TAKE ONE TABLET EVERY EVENING  . Specialty Vitamins Products (ONE-A-DAY BONE STRENGTH PO) Take by mouth daily.  Marland Kitchen triamcinolone cream (KENALOG) 0.1 % Apply 1 application topically 2 (two) times daily.  . vitamin B-12 (CYANOCOBALAMIN) 1000 MCG tablet Take 1 tablet (1,000 mcg total) by mouth daily.  . vitamin C (ASCORBIC ACID) 500 MG tablet Take 500 mg by mouth daily.   No facility-administered medications prior to visit.    Review of Systems  Constitutional: Negative.   Respiratory: Negative.   Cardiovascular: Negative.   Psychiatric/Behavioral: Negative.     Last CBC Lab Results  Component Value Date   WBC 8.1 03/18/2019   HGB 14.0 03/18/2019   HCT 42.6 03/18/2019   MCV 95 03/18/2019   MCH 31.0 03/18/2019   RDW 11.9 03/18/2019   PLT 290 94/70/9628   Last metabolic panel Lab Results  Component Value Date   GLUCOSE 91 03/18/2019   NA 140 03/18/2019   K 4.2 03/18/2019   CL 99 03/18/2019   CO2 25 03/18/2019   BUN 14 03/18/2019   CREATININE 0.62 03/18/2019   GFRNONAA 89 03/18/2019   GFRAA 103 03/18/2019   CALCIUM 10.0 03/18/2019   PHOS 2.0 (L) 02/27/2017   PROT 7.1 11/03/2019   ALBUMIN 4.8 (H) 11/03/2019   LABGLOB 2.4 03/18/2019   AGRATIO 1.9 03/18/2019   BILITOT 0.5 11/03/2019   ALKPHOS 92 11/03/2019   AST 23 11/03/2019   ALT 20 11/03/2019   ANIONGAP 5 02/28/2017      Objective    There were no vitals taken for this visit. BP Readings from Last 3 Encounters:  02/17/20 (!) 145/84  11/03/19 (!) 150/70  07/14/19 (!) 154/94   Wt Readings from Last 3 Encounters:  02/17/20 135 lb (61.2 kg)  11/03/19 132 lb (59.9 kg)  07/14/19 135 lb (61.2 kg)      Physical Exam Constitutional:      General: She is not in acute  distress. Pulmonary:     Effort: No respiratory distress.  Neurological:     Mental Status: She is alert.  Psychiatric:        Mood and Affect: Mood normal.        Thought Content: Thought content normal.       Assessment & Plan     1. GAD (generalized anxiety disorder) Much improved. Continue lexapro as below for 3-6 months and can then re-evaluate to see if possible to discontinue/complete treatment. She agrees.  - escitalopram (LEXAPRO) 10 MG tablet; Take 1 tablet (10 mg total) by mouth at bedtime.  Dispense: 90 tablet; Refill: 1  2. Depression, major, single episode, moderate ([place]) See above medical treatment plan. - escitalopram (LEXAPRO) 10 MG tablet; Take 1 tablet (10 mg total) by mouth at bedtime.  Dispense: 90 tablet; Refill: 1   No follow-ups on file.     I discussed the assessment and treatment plan with the patient. The patient was provided an opportunity to ask questions and all were answered. The patient agreed with the plan and demonstrated an understanding of the instructions.   The patient was advised to call back or seek an in-person evaluation if the symptoms worsen or if the condition fails to improve as anticipated.  I provided 5 minutes of non-face-to-face time during this encounter.  [name], [name], have reviewed all documentation for this visit. The documentation on 05/17/20 for the exam, diagnosis, procedures, and orders are all accurate and complete.  [place] [facility] [phone] (phone) [phone] (fax)  [place]

## 2020-06-29 NOTE — Progress Notes (Signed)
Subjective:   Isabel Gonzalez is a 75 y.o. female who presents for Medicare Annual (Subsequent) preventive examination.  I connected with Liam Graham today by telephone and verified that I am speaking with the correct person using two identifiers. Location patient: home Location provider: work Persons participating in the virtual visit: patient, Engineer, civil (consulting).    I discussed the limitations, risks, security and privacy concerns of performing an evaluation and management service by virtual visit and the availability of in person appointments. I also discussed with the patient that there may be a patient responsible charge related to this service. The patient expressed understanding and verbally consented to this telephonic visit.    Interactive video telecommunications were attempted between this provider and patient and were successful.  Some vital signs may be absent or patient reported.   Review of Systems    N/A  Cardiac Risk Factors include: advanced age (>89men, >91 women);dyslipidemia     Objective:    There were no vitals filed for this visit. There is no height or weight on file to calculate BMI.  Advanced Directives 06/30/2020 02/19/2019 03/13/2017 03/06/2017 02/23/2017 03/27/2016 07/08/2015  Does Patient Have a Medical Advance Directive? Yes Yes Yes Yes Yes Yes Yes  Type of Estate agent of [place];Living will Healthcare Power of [place];Living will Healthcare Power of [place];Living will Healthcare Power of [place];Living will Living will;Healthcare Power of [employer] Power of [place];Living will Healthcare Power of [place];Living will  Does patient want to make changes to medical advance directive? - - - No - Patient declined No - Patient declined - -  Copy of Healthcare Power of Attorney in Chart? Yes - validated most recent copy scanned in chart (See row information) Yes - validated most recent copy scanned in chart (See row  information) No - copy requested - Yes - Yes    Current Medications (verified) Outpatient Encounter Medications as of 06/30/2020  Medication Sig  . ALPRAZolam (XANAX) 0.5 MG tablet TAKE 1/2 TO 1 TABLET BY MOUTH DAILY AS NEEDED  . Apoaequorin (PREVAGEN) 10 MG CAPS Take 10 mg by mouth daily.  Marland Kitchen aspirin 81 MG EC tablet Take 81 mg by mouth daily.  . cetirizine (ZYRTEC) 10 MG tablet Take 10 mg by mouth daily.  . Cholecalciferol (VITAMIN D) 50 MCG (2000 UT) tablet Take 2,000 Units by mouth every other day.  . diltiazem (CARDIZEM) 30 MG tablet TAKE ONE TABLET 3 TIMES DAILY AS NEEDED  . escitalopram (LEXAPRO) 10 MG tablet Take 1 tablet (10 mg total) by mouth at bedtime.  Marland Kitchen ezetimibe (ZETIA) 10 MG tablet Take 1 tablet (10 mg total) by mouth daily.  . fluticasone (FLONASE) 50 MCG/ACT nasal spray 2 SPRAYS INTO BOTH NOSTRILS EVERY DAY  . Ginger, Zingiber officinalis, (GINGER ROOT PO) Take by mouth every other day.  . latanoprost (XALATAN) 0.005 % ophthalmic solution Place 1 drop into both eyes at bedtime.  . Magnesium 500 MG CAPS Take by mouth every other day.  . Misc Natural Products (ELDERBERRY ZINC/VIT C/IMMUNE MT) Take 1 tablet by mouth daily at 6 (six) AM. With Vitamin D  . POTASSIUM PO Take by mouth daily.  . pravastatin (PRAVACHOL) 20 MG tablet TAKE ONE TABLET EVERY EVENING  . Specialty Vitamins Products (ONE-A-DAY BONE STRENGTH PO) Take by mouth daily.  Marland Kitchen triamcinolone cream (KENALOG) 0.1 % Apply 1 application topically 2 (two) times daily.  . vitamin B-12 (CYANOCOBALAMIN) 1000 MCG tablet Take 1 tablet (1,000 mcg total) by mouth daily.  Marland Kitchen  vitamin C (ASCORBIC ACID) 500 MG tablet Take 500 mg by mouth daily.  Marland Kitchen XIIDRA 5 % SOLN Place 1 drop into both eyes 2 (two) times daily. Only using it once a day  . Latanoprost 0.005 % EMUL Apply 1 drop to eye daily. (Patient not taking: Reported on 06/30/2020)  . OVER THE COUNTER MEDICATION Elderberry-Take 1 capsule by mouth daily. (Patient not taking: Reported  on 06/30/2020)   No facility-administered encounter medications on file as of 06/30/2020.    Allergies (verified) Percocet [oxycodone-acetaminophen] and Vicodin [hydrocodone-acetaminophen]   History: Past Medical History:  Diagnosis Date  . Anemia   . Anxiety   . Arthritis   . Cancer (HCC)    squamous cell  . Constipation   . Dysrhythmia    palpitations   . Environmental allergies   . GERD (gastroesophageal reflux disease)   . History of bladder infections   . Hyperlipidemia   . Murmur   . Urinary problem    Past Surgical History:  Procedure Laterality Date  . BREAST REDUCTION SURGERY    . FACIAL COSMETIC SURGERY    . LAPAROTOMY N/A 02/24/2017   Procedure: EXPLORATORY LAPAROTOMY and repair small hernia;  Surgeon: Jules Husbands, MD;  Location: [facility] ORS;  Service: General;  Laterality: N/A;  . ORIF HUMERUS FRACTURE Right 07/08/2015   Procedure: OPEN REDUCTION INTERNAL FIXATION (ORIF) HUMERAL SHAFT FRACTURE with bone graft and allograft;  Surgeon: Justice Britain, MD;  Location: [facility];  Service: Orthopedics;  Laterality: Right;  . SKIN SURGERY     squamous cell   Family History  Problem Relation Age of Onset  . Stroke Mother 23  . Diabetes Brother   . Stroke Brother    Social History   Socioeconomic History  . Marital status: Widowed    Spouse name: Not on file  . Number of children: 3  . Years of education: Not on file  . Highest education level: Some college, no degree  Occupational History  . Occupation: retired  Tobacco Use  . Smoking status: Former Smoker    Packs/day: 0.25    Years: 25.00    Pack years: 6.25    Quit date: 06/11/1990    Years since quitting: 30.0  . Smokeless tobacco: Never Used  Vaping Use  . Vaping Use: Never used  Substance and Sexual Activity  . Alcohol use: Yes    Alcohol/week: 7.0 - 11.0 standard drinks    Types: 7 - 11 Glasses of wine per week    Comment: 1-1.5 daily  . Drug use: No  . Sexual activity: Not on file  Other Topics  Concern  . Not on file  Social History Narrative   Regular exercise         Social Determinants of Health   Financial Resource Strain: Low Risk   . Difficulty of Paying Living Expenses: Not hard at all  Food Insecurity: No Food Insecurity  . Worried About Charity fundraiser in the Last Year: Never true  . Ran Out of Food in the Last Year: Never true  Transportation Needs: No Transportation Needs  . Lack of Transportation (Medical): No  . Lack of Transportation (Non-Medical): No  Physical Activity: Sufficiently Active  . Days of Exercise per Week: 5 days  . Minutes of Exercise per Session: 40 min  Stress: No Stress Concern Present  . Feeling of Stress : Not at all  Social Connections: Moderately Integrated  . Frequency of Communication with Friends and Family: More  than three times a week  . Frequency of Social Gatherings with Friends and Family: More than three times a week  . Attends Religious Services: More than 4 times per year  . Active Member of Clubs or Organizations: Yes  . Attends Banker Meetings: More than 4 times per year  . Marital Status: Widowed    Tobacco Counseling Counseling given: Not Answered   Clinical Intake:  Pre-visit preparation completed: Yes  Pain : No/denies pain     Nutritional Risks: None Diabetes: No  How often do you need to have someone help you when you read instructions, pamphlets, or other written materials from your doctor or pharmacy?: 1 - Never  Diabetic? No  Interpreter Needed?: No  Information entered by :: [facility], LPN   Activities of Daily Living In your present state of health, do you have any difficulty performing the following activities: 06/30/2020  Hearing? N  Vision? N  Difficulty concentrating or making decisions? N  Walking or climbing stairs? N  Dressing or bathing? N  Doing errands, shopping? N  Preparing Food and eating ? N  Using the Toilet? N  In the past six months, have you  accidently leaked urine? N  Do you have problems with loss of bowel control? N  Managing your Medications? N  Managing your Finances? N  Housekeeping or managing your Housekeeping? N  Some recent data might be hidden    Patient Care Team: Margaretann Loveless, [name] as PCP - General (Family Medicine) Antonieta Iba, MD as Consulting Physician (Cardiology) Debbrah Alar, MD as Consulting Physician (Dermatology) Thurnell Garbe, OD (Optometry)  Indicate any recent Medical Services you may have received from other than [facility] providers in the past year (date may be approximate).     Assessment:   This is a routine wellness examination for Isabel Gonzalez.  Hearing/Vision screen No exam data present  Dietary issues and exercise activities discussed: Current Exercise Habits: Home exercise routine, Type of exercise: walking, Time (Minutes): 40, Frequency (Times/Week): 5, Weekly Exercise (Minutes/Week): 200, Intensity: Mild, Exercise limited by: None identified  Goals    . DIET - INCREASE WATER INTAKE     Recommend to drink at least 6-8 8oz glasses of water per day.      Depression Screen PHQ 2/9 Scores 06/30/2020 04/19/2020 02/19/2019 02/06/2018 03/13/2017 03/13/2017 03/27/2016  PHQ - 2 Score 0 4 0 0 0 0 0  PHQ- 9 Score - 17 - 0 2 - -    Fall Risk Fall Risk  06/30/2020 07/14/2019 02/19/2019 02/06/2018 03/13/2017  Falls in the past year? 0 0 0 No No  Number falls in past yr: 0 0 - - -  Injury with Fall? 0 0 - - -  Follow up - Falls evaluation completed - - -    FALL RISK PREVENTION PERTAINING TO THE HOME:  Any stairs in or around the home? Yes  If so, are there any without handrails? No  Home free of loose throw rugs in walkways, pet beds, electrical cords, etc? Yes  Adequate lighting in your home to reduce risk of falls? Yes   ASSISTIVE DEVICES UTILIZED TO PREVENT FALLS:  Life alert? No  Use of a cane, walker or w/c? No  Grab bars in the bathroom? No  Shower chair or bench in  shower? No  Elevated toilet seat or a handicapped toilet? Yes    Cognitive Function: Normal cognitive status assessed by direct observation by this Nurse Health Advisor. No  abnormalities found.          Immunizations Immunization History  Administered Date(s) Administered  . Influenza Split 03/29/2009  . Influenza, High Dose Seasonal PF 03/24/2020  . Influenza-Unspecified 03/20/2018, 02/12/2019  . PFIZER(Purple Top)SARS-COV-2 Vaccination 06/18/2019, 07/09/2019, 04/12/2020  . Pneumococcal Conjugate-13 12/10/2013  . Pneumococcal Polysaccharide-23 06/02/2010  . Td 06/29/2003  . Zoster 04/18/2006  . Zoster Recombinat (Shingrix) 09/07/2017    TDAP status: Due, Education has been provided regarding the importance of this vaccine. Advised may receive this vaccine at local pharmacy or Health Dept. Aware to provide a copy of the vaccination record if obtained from local pharmacy or Health Dept. Verbalized acceptance and understanding.  Flu Vaccine status: Up to date  Pneumococcal vaccine status: Up to date  Covid-19 vaccine status: Completed vaccines  Qualifies for Shingles Vaccine? Yes   Zostavax completed Yes   Shingrix Completed?: Yes   Screening Tests Health Maintenance  Topic Date Due  . COLONOSCOPY (Pts 45-57yrs Insurance coverage will need to be confirmed)  04/29/2017  . TETANUS/TDAP  03/09/2023 (Originally 06/28/2013)  . DEXA SCAN  03/15/2023  . INFLUENZA VACCINE  Completed  . COVID-19 Vaccine  Completed  . Hepatitis C Screening  Completed  . PNA vac Low Risk Adult  Completed    Health Maintenance  Health Maintenance Due  Topic Date Due  . COLONOSCOPY (Pts 45-43yrs Insurance coverage will need to be confirmed)  04/29/2017    Colorectal cancer screening: Currently due, declined colonoscopy referral or cologuard order at this time.   Mammogram status: No longer required due to age.  Bone Density status: Completed 03/14/18. Results reflect: Bone density results:  OSTEOPENIA. Repeat every 5 years.  Lung Cancer Screening: (Low Dose CT Chest recommended if Age 65-80 years, 30 pack-year currently smoking OR have quit w/in 15years.) does not qualify.   Additional Screening:  Hepatitis C Screening: Up to date  Vision Screening: Recommended annual ophthalmology exams for early detection of glaucoma and other disorders of the eye. Is the patient up to date with their annual eye exam?  Yes  Who is the provider or what is the name of the office in which the patient attends annual eye exams? Dr Algis Liming If pt is not established with a provider, would they like to be referred to a provider to establish care? No .   Dental Screening: Recommended annual dental exams for proper oral hygiene  Community Resource Referral / Chronic Care Management: CRR required this visit?  No   CCM required this visit?  No      Plan:     I have personally reviewed and noted the following in the patient's chart:   . Medical and social history . Use of alcohol, tobacco or illicit drugs  . Current medications and supplements . Functional ability and status . Nutritional status . Physical activity . Advanced directives . List of other physicians . Hospitalizations, surgeries, and ER visits in previous 12 months . Vitals . Screenings to include cognitive, depression, and falls . Referrals and appointments  In addition, I have reviewed and discussed with patient certain preventive protocols, quality metrics, and best practice recommendations. A written personalized care plan for preventive services as well as general preventive health recommendations were provided to patient.     Isabel Gonzalez [place], [place]   2[birth year]89   Nurse Notes: Declined a colonoscopy referral or cologuard order at this time.

## 2020-06-30 ENCOUNTER — Other Ambulatory Visit: Payer: Self-pay

## 2020-06-30 ENCOUNTER — Ambulatory Visit (INDEPENDENT_AMBULATORY_CARE_PROVIDER_SITE_OTHER): Payer: PPO

## 2020-06-30 DIAGNOSIS — Z0000 Encounter for general adult medical examination without abnormal findings: Secondary | ICD-10-CM

## 2020-06-30 NOTE — Patient Instructions (Signed)
Isabel Gonzalez , Thank you for taking time to come for your Medicare Wellness Visit. I appreciate your ongoing commitment to your health goals. Please review the following plan we discussed and let me know if I can assist you in the future.   Screening recommendations/referrals: Colonoscopy: Currently due, declined a colonoscopy referral or cologuard order at this time.  Mammogram: No longer required.  Bone Density: Up to date, due 03/2023 Recommended yearly ophthalmology/optometry visit for glaucoma screening and checkup Recommended yearly dental visit for hygiene and checkup  Vaccinations: Influenza vaccine: Done 03/24/20 Pneumococcal vaccine: Completed series Tdap vaccine: Currently due, declined receiving. Shingles vaccine: Completed series per patient.  Advanced directives: Currently on file.  Conditions/risks identified: Continue to increase water intake to 6-8 8 oz glasses a day.   Next appointment: 07/06/21 @ 2:40 PM for an AWV. Declined scheduling a follow up with PCP at this time.    Preventive Care 42 Years and Older, Female Preventive care refers to lifestyle choices and visits with your health care provider that can promote health and wellness. What does preventive care include?  A yearly physical exam. This is also called an annual well check.  Dental exams once or twice a year.  Routine eye exams. Ask your health care provider how often you should have your eyes checked.  Personal lifestyle choices, including:  Daily care of your teeth and gums.  Regular physical activity.  Eating a healthy diet.  Avoiding tobacco and drug use.  Limiting alcohol use.  Practicing safe sex.  Taking low-dose aspirin every day.  Taking vitamin and mineral supplements as recommended by your health care provider. What happens during an annual well check? The services and screenings done by your health care provider during your annual well check will depend on your age,  overall health, lifestyle risk factors, and family history of disease. Counseling  Your health care provider may ask you questions about your:  Alcohol use.  Tobacco use.  Drug use.  Emotional well-being.  Home and relationship well-being.  Sexual activity.  Eating habits.  History of falls.  Memory and ability to understand (cognition).  Work and work Statistician.  Reproductive health. Screening  You may have the following tests or measurements:  Height, weight, and BMI.  Blood pressure.  Lipid and cholesterol levels. These may be checked every 5 years, or more frequently if you are over 45 years old.  Skin check.  Lung cancer screening. You may have this screening every year starting at age 27 if you have a 30-pack-year history of smoking and currently smoke or have quit within the past 15 years.  Fecal occult blood test (FOBT) of the stool. You may have this test every year starting at age 41.  Flexible sigmoidoscopy or colonoscopy. You may have a sigmoidoscopy every 5 years or a colonoscopy every 10 years starting at age 78.  Hepatitis C blood test.  Hepatitis B blood test.  Sexually transmitted disease (STD) testing.  Diabetes screening. This is done by checking your blood sugar (glucose) after you have not eaten for a while (fasting). You may have this done every 1-3 years.  Bone density scan. This is done to screen for osteoporosis. You may have this done starting at age 9.  Mammogram. This may be done every 1-2 years. Talk to your health care provider about how often you should have regular mammograms. Talk with your health care provider about your test results, treatment options, and if necessary, the need for more  tests. Vaccines  Your health care provider may recommend certain vaccines, such as:  Influenza vaccine. This is recommended every year.  Tetanus, diphtheria, and acellular pertussis (Tdap, Td) vaccine. You may need a Td booster every 10  years.  Zoster vaccine. You may need this after age 66.  Pneumococcal 13-valent conjugate (PCV13) vaccine. One dose is recommended after age 62.  Pneumococcal polysaccharide (PPSV23) vaccine. One dose is recommended after age 22. Talk to your health care provider about which screenings and vaccines you need and how often you need them. This information is not intended to replace advice given to you by your health care provider. Make sure you discuss any questions you have with your health care provider. Document Released: 06/25/2015 Document Revised: 02/16/2016 Document Reviewed: 03/30/2015 Elsevier Interactive Patient Education  2017 [place] Prevention in the Home Falls can cause injuries. They can happen to people of all ages. There are many things you can do to make your home safe and to help prevent falls. What can I do on the outside of my home?  Regularly fix the edges of walkways and driveways and fix any cracks.  Remove anything that might make you trip as you walk through a door, such as a raised step or threshold.  Trim any bushes or trees on the path to your home.  Use bright outdoor lighting.  Clear any walking paths of anything that might make someone trip, such as rocks or tools.  Regularly check to see if handrails are loose or broken. Make sure that both sides of any steps have handrails.  Any raised decks and porches should have guardrails on the edges.  Have any leaves, snow, or ice cleared regularly.  Use sand or salt on walking paths during winter.  Clean up any spills in your garage right away. This includes oil or grease spills. What can I do in the bathroom?  Use night lights.  Install grab bars by the toilet and in the tub and shower. Do not use towel bars as grab bars.  Use non-skid mats or decals in the tub or shower.  If you need to sit down in the shower, use a plastic, non-slip stool.  Keep the floor dry. Clean up any water that  spills on the floor as soon as it happens.  Remove soap buildup in the tub or shower regularly.  Attach bath mats securely with double-sided non-slip rug tape.  Do not have throw rugs and other things on the floor that can make you trip. What can I do in the bedroom?  Use night lights.  Make sure that you have a light by your bed that is easy to reach.  Do not use any sheets or blankets that are too big for your bed. They should not hang down onto the floor.  Have a firm chair that has side arms. You can use this for support while you get dressed.  Do not have throw rugs and other things on the floor that can make you trip. What can I do in the kitchen?  Clean up any spills right away.  Avoid walking on wet floors.  Keep items that you use a lot in easy-to-reach places.  If you need to reach something above you, use a strong step stool that has a grab bar.  Keep electrical cords out of the way.  Do not use floor polish or wax that makes floors slippery. If you must use wax, use non-skid floor  wax.  Do not have throw rugs and other things on the floor that can make you trip. What can I do with my stairs?  Do not leave any items on the stairs.  Make sure that there are handrails on both sides of the stairs and use them. Fix handrails that are broken or loose. Make sure that handrails are as long as the stairways.  Check any carpeting to make sure that it is firmly attached to the stairs. Fix any carpet that is loose or worn.  Avoid having throw rugs at the top or bottom of the stairs. If you do have throw rugs, attach them to the floor with carpet tape.  Make sure that you have a light switch at the top of the stairs and the bottom of the stairs. If you do not have them, ask someone to add them for you. What else can I do to help prevent falls?  Wear shoes that:  Do not have high heels.  Have rubber bottoms.  Are comfortable and fit you well.  Are closed at the  toe. Do not wear sandals.  If you use a stepladder:  Make sure that it is fully opened. Do not climb a closed stepladder.  Make sure that both sides of the stepladder are locked into place.  Ask someone to hold it for you, if possible.  Clearly mark and make sure that you can see:  Any grab bars or handrails.  First and last steps.  Where the edge of each step is.  Use tools that help you move around (mobility aids) if they are needed. These include:  Canes.  Walkers.  Scooters.  Crutches.  Turn on the lights when you go into a dark area. Replace any light bulbs as soon as they burn out.  Set up your furniture so you have a clear path. Avoid moving your furniture around.  If any of your floors are uneven, fix them.  If there are any pets around you, be aware of where they are.  Review your medicines with your doctor. Some medicines can make you feel dizzy. This can increase your chance of falling. Ask your doctor what other things that you can do to help prevent falls. This information is not intended to replace advice given to you by your health care provider. Make sure you discuss any questions you have with your health care provider. Document Released: 03/25/2009 Document Revised: 11/04/2015 Document Reviewed: 07/03/2014 Elsevier Interactive Patient Education  2017 [employer].

## 2020-07-14 DIAGNOSIS — Z20822 Contact with and (suspected) exposure to covid-19: Secondary | ICD-10-CM | POA: Diagnosis not present

## 2020-07-23 ENCOUNTER — Other Ambulatory Visit: Payer: Self-pay | Admitting: Cardiovascular Disease

## 2020-07-30 ENCOUNTER — Telehealth: Payer: Self-pay | Admitting: Cardiovascular Disease

## 2020-07-30 NOTE — Telephone Encounter (Signed)
Patient calling to report interim episodes for the last 2 months of tingling in Left thigh followed by burning sensation.   Please call to discuss if this is cardiovascular related.

## 2020-07-30 NOTE — Telephone Encounter (Signed)
Was able to return pt's call regarding her tingling/burnign sensation to her left thigh. Pt reports does not feel cardiac related, but when she called to make her annual f/u appt, she wanted to mention it. Reports episodes for the last 2 months of tingling in left thigh followed by burning sensation. Advised pt that it could be a pinch nerve such as sciatic nerve, would reach out to PCP for advise/recomoendation or even try lower back/leg stretches to see if that ease the pain. Pt verbalized understanding, reports not redness, heat, or swelling to the thigh area. No additional concerns at this time. Agreeable to plan, will call back for anything further.

## 2020-08-12 ENCOUNTER — Other Ambulatory Visit: Payer: Self-pay | Admitting: Physician Assistant

## 2020-08-12 DIAGNOSIS — F419 Anxiety disorder, unspecified: Secondary | ICD-10-CM

## 2020-08-12 DIAGNOSIS — I10 Essential (primary) hypertension: Secondary | ICD-10-CM

## 2020-08-12 NOTE — Telephone Encounter (Signed)
Requested medication (s) are due for refill today:   Yes for Cardizem,   Xanax provider to review  Requested medication (s) are on the active medication list:   Yes  Future visit scheduled:   No   Last ordered: Cardizem 01/15/2020 #180, 0 refills;        Xanax 01/15/2020 #30, 5 refills  Clinic note:  Non delegated refill    Requested Prescriptions  Pending Prescriptions Disp Refills   diltiazem (CARDIZEM) 30 MG tablet [Pharmacy Med Name: DILTIAZEM HCL 30 MG TAB] 180 tablet 0    Sig: TAKE ONE TABLET 3 TIMES DAILY AS NEEDED      Cardiovascular:  Calcium Channel Blockers Failed - 08/12/2020 12:06 PM      Failed - Last BP in normal range    BP Readings from Last 1 Encounters:  02/17/20 (!) 145/84          Passed - Valid encounter within last 6 months    Recent Outpatient Visits           2 months ago GAD (generalized anxiety disorder)   [facility], [place], [name]   3 months ago GAD (generalized anxiety disorder)   [facility] [facility], Clearnce Sorrel, [place]   5 months ago [place], Clearnce Sorrel, [place]   1 year ago Phlebitis   [place], Clearnce Sorrel, [place]   1 year ago [place], Clearnce Sorrel, [place]       Future Appointments             In 3 months Gollan, Kathlene November, MD [place], [facility]               ALPRAZolam Duanne Moron) 0.5 MG tablet [Pharmacy Med Name: ALPRAZOLAM 0.5 MG TAB] 30 tablet     Sig: TAKE 1/2 TO 1 TABLET BY MOUTH DAILY AS NEEDED      Not Delegated - Psychiatry:  Anxiolytics/Hypnotics Failed - 08/12/2020 12:06 PM      Failed - This refill cannot be delegated      Failed - Urine Drug Screen completed in last 360 days      Passed - Valid encounter within last 6 months    Recent Outpatient Visits           2 months ago GAD (generalized anxiety disorder)   [place], [place],  [name]   3 months ago GAD (generalized anxiety disorder)   [facility], [place], [place]   5 months ago [place], Clearnce Sorrel, [place]   1 year ago Phlebitis   [place], Clearnce Sorrel, [place]   1 year ago [place], Clearnce Sorrel, [place]       Future Appointments             In 3 months Gollan, Kathlene November, MD [facility], [facility]

## 2020-08-16 DIAGNOSIS — H401131 Primary open-angle glaucoma, bilateral, mild stage: Secondary | ICD-10-CM | POA: Diagnosis not present

## 2020-09-17 DIAGNOSIS — D2272 Melanocytic nevi of left lower limb, including hip: Secondary | ICD-10-CM | POA: Diagnosis not present

## 2020-09-17 DIAGNOSIS — L821 Other seborrheic keratosis: Secondary | ICD-10-CM | POA: Diagnosis not present

## 2020-09-17 DIAGNOSIS — D2262 Melanocytic nevi of left upper limb, including shoulder: Secondary | ICD-10-CM | POA: Diagnosis not present

## 2020-09-17 DIAGNOSIS — Z872 Personal history of diseases of the skin and subcutaneous tissue: Secondary | ICD-10-CM | POA: Diagnosis not present

## 2020-09-17 DIAGNOSIS — Z85828 Personal history of other malignant neoplasm of skin: Secondary | ICD-10-CM | POA: Diagnosis not present

## 2020-09-17 DIAGNOSIS — D225 Melanocytic nevi of trunk: Secondary | ICD-10-CM | POA: Diagnosis not present

## 2020-09-24 DIAGNOSIS — Z8673 Personal history of transient ischemic attack (TIA), and cerebral infarction without residual deficits: Secondary | ICD-10-CM | POA: Diagnosis not present

## 2020-09-24 DIAGNOSIS — R471 Dysarthria and anarthria: Secondary | ICD-10-CM | POA: Diagnosis not present

## 2020-09-24 DIAGNOSIS — F419 Anxiety disorder, unspecified: Secondary | ICD-10-CM | POA: Diagnosis not present

## 2020-09-24 DIAGNOSIS — I10 Essential (primary) hypertension: Secondary | ICD-10-CM | POA: Diagnosis not present

## 2020-09-24 DIAGNOSIS — G459 Transient cerebral ischemic attack, unspecified: Secondary | ICD-10-CM | POA: Diagnosis not present

## 2020-09-24 DIAGNOSIS — R200 Anesthesia of skin: Secondary | ICD-10-CM | POA: Diagnosis not present

## 2020-09-24 DIAGNOSIS — I639 Cerebral infarction, unspecified: Secondary | ICD-10-CM | POA: Diagnosis not present

## 2020-09-27 ENCOUNTER — Telehealth: Payer: Self-pay

## 2020-09-27 NOTE — Telephone Encounter (Signed)
Copied from [place] [id number]. Topic: General - Other >> Sep 27, 2020 10:17 AM Keene Breath wrote: Reason for CRM: Patient is a former patient of Dr. Marlyn Corporal [place]  TIA on Friday and they would like her to follow up with her PCP.  Patient is out of town until Wednesday and stated she would like to see someone around Thursday if possible.  Patient also stated it was not emergent but she did need an appt.  Please advise and call patient to schedule at [phone]

## 2020-09-27 NOTE — Telephone Encounter (Signed)
Noted she should go to the ER if worsening symptoms prior to follow up and can call back to office to schedule sooner follow up if any cancellations.

## 2020-09-27 NOTE — Telephone Encounter (Signed)
Advise her [name], [name] not at practice she will need to be scheduled with another provider.  Advise Emergency room if any emergent symptoms or any stroke like symptoms return immediately be seen.

## 2020-09-27 NOTE — Telephone Encounter (Signed)
I did not see any availability with anydoctor for visit until June, patient states that she will have her hospital follow up with cardiologist Dr. Rockey Situ and follow at [facility], I have schedule patient a future appt with Dr. B for 12/07/20 to follow up.

## 2020-09-27 NOTE — Telephone Encounter (Signed)
Patient has been advised and understood.KW

## 2020-10-05 ENCOUNTER — Ambulatory Visit: Payer: PPO | Admitting: Family

## 2020-10-05 ENCOUNTER — Encounter: Payer: Self-pay | Admitting: Family

## 2020-10-05 ENCOUNTER — Other Ambulatory Visit: Payer: Self-pay

## 2020-10-05 VITALS — BP 146/76 | HR 67 | Ht 61.0 in | Wt 136.0 lb

## 2020-10-05 DIAGNOSIS — Z8673 Personal history of transient ischemic attack (TIA), and cerebral infarction without residual deficits: Secondary | ICD-10-CM

## 2020-10-05 DIAGNOSIS — E785 Hyperlipidemia, unspecified: Secondary | ICD-10-CM

## 2020-10-05 DIAGNOSIS — I493 Ventricular premature depolarization: Secondary | ICD-10-CM

## 2020-10-05 DIAGNOSIS — R002 Palpitations: Secondary | ICD-10-CM

## 2020-10-05 DIAGNOSIS — I700 Atherosclerosis of aorta: Secondary | ICD-10-CM

## 2020-10-05 DIAGNOSIS — I10 Essential (primary) hypertension: Secondary | ICD-10-CM | POA: Diagnosis not present

## 2020-10-05 MED ORDER — DILTIAZEM HCL ER COATED BEADS 120 MG PO CP24: 120.0000 mg | ORAL_CAPSULE | Freq: Every day | ORAL | 2 refills | Status: DC

## 2020-10-05 NOTE — Progress Notes (Signed)
Office Visit    Patient Name: Isabel Gonzalez Date of Encounter: 10/05/2020  PCP:  [place], Jennifer M, [place]  Cardiologist:  Ida Rogue, MD  Advanced Practice Provider:  No care team member to display Electrophysiologist:  None   Chief Complaint    Isabel Gonzalez is a 75 y.o. female with a hx of aortic atherosclerosis, TIA, anxiety, HLD, palpitations, PVC presents today for hospital follow up.   Past Medical History    Past Medical History:  Diagnosis Date  . Anemia   . Anxiety   . Arthritis   . Cancer (HCC)    squamous cell  . Constipation   . Dysrhythmia    palpitations   . Environmental allergies   . GERD (gastroesophageal reflux disease)   . History of bladder infections   . Hyperlipidemia   . Murmur   . Urinary problem    Past Surgical History:  Procedure Laterality Date  . BREAST REDUCTION SURGERY    . FACIAL COSMETIC SURGERY    . LAPAROTOMY N/A 02/24/2017   Procedure: EXPLORATORY LAPAROTOMY and repair small hernia;  Surgeon: Jules Husbands, MD;  Location: [facility] ORS;  Service: General;  Laterality: N/A;  . ORIF HUMERUS FRACTURE Right 07/08/2015   Procedure: OPEN REDUCTION INTERNAL FIXATION (ORIF) HUMERAL SHAFT FRACTURE with bone graft and allograft;  Surgeon: Justice Britain, MD;  Location: [place];  Service: Orthopedics;  Laterality: Right;  . SKIN SURGERY     squamous cell    Allergies  Allergies  Allergen Reactions  . Percocet [Oxycodone-Acetaminophen] Nausea And Vomiting  . Vicodin [Hydrocodone-Acetaminophen] Nausea And Vomiting    History of Present Illness    Isabel Gonzalez is a 75 y.o. female with a hx of aortic atherosclerosis, TIA, anxiety, HLD, palpitations, PVC  last seen 11/03/19 by Dr. Rockey Situ.  Previous intolerance to high intensity statin including Lipitor, Crestor, Vytorin with myalgia. She has tolerated Pravastatin 20mg  daily as well as Zetia 10mg  daily.  She was hospitalized  09/24/2020 and had [facility] in [facility].  She presented after having an episode of word salad followed by facial numbness and left hand numbness while talking with a friend.  It resolved after a few minutes.  She had CT head, neck which was unremarkable.  MRI brain with no evidence of infarction.  She had echocardiogram with LVEF 55 to 60%, LV normal size and function, no wall motion abnormalities, mild diastolic dysfunction, mild MR, trace TR.  Her heart rate during admission ranged from 53 to 71 bpm.  Her blood pressure ranged from 140s over 60s to 190s over 80s.  Her EKG showed sinus bradycardia 56 bpm with no acute ST/T wave changes.  Lab work 09/24/2020  CO2 30.1, BUN 19, creatinine 0.5, GFR greater than 60, AST 53, ALT 45, total protein 8.4, K4.0, NA 137  WBC 7.3, RBC 4.8, hemoglobin 15.3, hematocrit 46.3  Total cholesterol 147.9, triglycerides 132, HDL 52, LDL 68  She presents today for follow-up. Reports no recurrent word salad or TIA symptoms. Tells me she has neurology appointment next month to establish care. We reviewed her hospitalization and testing in detail. Reports no shortness of breath nor dyspnea on exertion. Reports no chest pain, pressure, or tightness. No edema, orthopnea, PND. Reports rare palpitations. Tells me she takes her Dilitazem 30mg  QHS as it helps her sleep. She endorses some pain in her left thigh that radiates from her left buttocks and  is tender on palpation. We discussed that it is likely caused by sciatica. Checks her BP intermittently at home with readings persistently >130/80. Does not previous history of hypotension and occasional lightheadedness. No near syncope nor syncope. Tells me she did have a mechanical fall after her most recent hospitalization where she tripped over her coffee table and struck her left hip on her TV cabinet. Reports bruising and soreness but no difficulties ambulating.   EKGs/Labs/Other Studies Reviewed:   The following  studies were reviewed today:  Echo 09/24/20 LVEF 55 to 60%.  LV normal size and function Normal LV segmental wall motion Mild diastolic dysfunction is present (impaired relaxation pattern) Mild mitral regurgitation Trace tricuspid regurgitation  CT brain without contrast 09/24/2020 No acute intracranial hemorrhage or large territory infarction per unenhanced CT head.  CT angiography of head and neck 09/24/2020 No acute large vessel occlusion intracranially No significant articular stenosis in the neck  MRI brain w/o contrast 09/24/20 No evidence of a recent (acute or subacute) infarction  EKG:  EKG is ordered today.  The ekg ordered today demonstrates NSR 67 pm with occasional PVC. No acute ST/T wave changes.  Recent Labs: 11/03/2019: ALT 20  Recent Lipid Panel    Component Value Date/Time   CHOL 194 11/03/2019 1140   TRIG 146 11/03/2019 1140   HDL 60 11/03/2019 1140   CHOLHDL 3.2 11/03/2019 1140   CHOLHDL 4.0 Ratio 08/24/2009 2024   VLDL 23 08/24/2009 2024   LDLCALC 109 (H) 11/03/2019 1140     Home Medications   Current Meds  Medication Sig  . ALPRAZolam (XANAX) 0.5 MG tablet TAKE 1/2 TO 1 TABLET BY MOUTH DAILY AS NEEDED  . Apoaequorin (PREVAGEN) 10 MG CAPS Take 10 mg by mouth daily.  Marland Kitchen aspirin 81 MG EC tablet Take 81 mg by mouth daily.  . cetirizine (ZYRTEC) 10 MG tablet Take 10 mg by mouth daily.  . Cholecalciferol (VITAMIN D) 50 MCG (2000 UT) tablet Take 2,000 Units by mouth every other day.  . diltiazem (CARDIZEM) 30 MG tablet TAKE ONE TABLET 3 TIMES DAILY AS NEEDED (Patient taking differently: Take 30 mg by mouth daily.)  . escitalopram (LEXAPRO) 10 MG tablet Take 1 tablet (10 mg total) by mouth at bedtime.  . Esomeprazole Magnesium (NEXIUM 24HR PO) Take 1 tablet by mouth at bedtime.  Marland Kitchen ezetimibe (ZETIA) 10 MG tablet Take 1 tablet (10 mg total) by mouth daily.  . fluticasone (FLONASE) 50 MCG/ACT nasal spray 2 SPRAYS INTO BOTH NOSTRILS EVERY DAY  . Ginger, Zingiber  officinalis, (GINGER ROOT PO) Take by mouth every other day.  . latanoprost (XALATAN) 0.005 % ophthalmic solution Place 1 drop into both eyes at bedtime.  . Magnesium 500 MG CAPS Take by mouth every other day.  . Misc Natural Products (ELDERBERRY ZINC/VIT C/IMMUNE MT) Take 1 tablet by mouth daily at 6 (six) AM. With Vitamin D  . POTASSIUM PO Take by mouth daily.  . pravastatin (PRAVACHOL) 20 MG tablet TAKE ONE TABLET EVERY EVENING  . RESTASIS 0.05 % ophthalmic emulsion USE ONE DROP IN BOTH EYES TWICE DAILY  . Specialty Vitamins Products (ONE-A-DAY BONE STRENGTH PO) Take by mouth daily.  Marland Kitchen triamcinolone cream (KENALOG) 0.1 % Apply 1 application topically 2 (two) times daily.  . vitamin B-12 (CYANOCOBALAMIN) 1000 MCG tablet Take 1 tablet (1,000 mcg total) by mouth daily.  . vitamin C (ASCORBIC ACID) 500 MG tablet Take 500 mg by mouth daily.  Marland Kitchen XIIDRA 5 % SOLN Place 1 drop  into both eyes 2 (two) times daily. Only using it once a day     Review of Systems  All other systems reviewed and are otherwise negative except as noted above.  Physical Exam    VS:  BP (!) 146/76 (BP Location: Left Arm, Patient Position: Sitting, Cuff Size: Normal)   Pulse 67   Ht 5\' 1"  (1.549 m)   Wt 136 lb (61.7 kg)   SpO2 98%   BMI 25.70 kg/m  , BMI Body mass index is 25.7 kg/m.  Wt Readings from Last 3 Encounters:  10/05/20 136 lb (61.7 kg)  02/17/20 135 lb (61.2 kg)  11/03/19 132 lb (59.9 kg)    GEN: Well nourished, well developed, in no acute distress. HEENT: normal. Neck: Supple, no JVD, carotid bruits, or masses. Cardiac: RRR, no murmurs, rubs, or gallops. No clubbing, cyanosis, edema.  Radials/DP/PT 2+ and equal bilaterally.  Respiratory:  Respirations regular and unlabored, clear to auscultation bilaterally. GI: Soft, nontender, nondistended. MS: No deformity or atrophy. Skin: Warm and dry, no rash. Neuro:  Strength and sensation are intact. Psych: Normal affect.  Assessment & Plan    1. TIA -  CT head/neck, MRI brain, and echocardiogram during recent admission unremarkable. She has an appointment with neurology upcoming next month per her report. Continue Aspirin, Pravastatin.  2. Aortic atherosclerosis - Continue Aspirin, Pravastatin. EKG no acute ST/T wave changes. No indication for ischemic evaluation. Heart healthy diet and regular cardiovascular exercise encouraged.   3. HTN - BP elevated today and during recent hospitalization. As she tolerates short-acting diltiazem, will start Diltiazem CD 120mg  daily. She will monitor BP at home and keep a log. Goal BP <130/80. If BP not at goal on follow up, consider addition of ARB.  4. PVC / Palpitations - Stable finding by EKG today. Reports rare palpitations. In the setting of uncontrolled hypertension, start Diltiazem CD 120mg  QD, as above. She may utilize Diltiazem 30mg  as needed for breakthrough palpitations.   5. HLD - Lipid panel 09/24/20 with LDL at goal of <70. Continue Pravastatin 20mg  daily, Zetia 10mg  daily.  6. Anxiety - Continue to follow with PCP.   Disposition: Follow up 11/11/20 as scheduled with Dr. Rockey Situ for BP follow up. Signed, Loel Dubonnet, NP 10/05/2020, 9:51 AM [place]

## 2020-10-05 NOTE — Patient Instructions (Addendum)
Medication Instructions:  Your physician has recommended you make the following change in your medication:   START Diltiazem CD (extended release) 120mg  daily  You may continue the short acting Diltiazem (short acting) as needed.   *If you need a refill on your cardiac medications before your next appointment, please call your pharmacy*  Lab Work: None ordered today   Testing/Procedures: Your EKG today showed normal sinus rhythm with occasional early beat PVC's.   Follow-Up: At [facility], you and your health needs are our priority.  As part of our continuing mission to provide you with exceptional heart care, we have created designated Provider Care Teams.  These Care Teams include your primary Cardiologist (physician) and Advanced Practice Providers (APPs -  Physician Assistants and Nurse Practitioners) who all work together to provide you with the care you need, when you need it.  We recommend signing up for the patient portal called "MyChart".  Sign up information is provided on this After Visit Summary.  MyChart is used to connect with patients for Virtual Visits (Telemedicine).  Patients are able to view lab/test results, encounter notes, upcoming appointments, etc.  Non-urgent messages can be sent to your provider as well.   To learn more about what you can do with MyChart, go to [email or web address].    Your next appointment:   11/11/20 with Dr. Rockey Situ as scheduled  Other Instructions  Recommend doing stretches for sciatica.   Heart Healthy Diet Recommendations: A low-salt diet is recommended. Meats should be grilled, baked, or boiled. Avoid fried foods. Focus on lean protein sources like fish or chicken with vegetables and fruits. The American Heart Association is a Microbiologist!  American Heart Association Diet and Lifeystyle Recommendations   Exercise recommendations: The American Heart Association recommends 150 minutes of moderate intensity exercise weekly. Try 30  minutes of moderate intensity exercise 4-5 times per week. This could include walking, jogging, or swimming.

## 2020-10-22 ENCOUNTER — Other Ambulatory Visit: Payer: Self-pay | Admitting: Cardiovascular Disease

## 2020-11-04 DIAGNOSIS — Z79899 Other long term (current) drug therapy: Secondary | ICD-10-CM | POA: Diagnosis not present

## 2020-11-04 DIAGNOSIS — E559 Vitamin D deficiency, unspecified: Secondary | ICD-10-CM | POA: Diagnosis not present

## 2020-11-04 DIAGNOSIS — R569 Unspecified convulsions: Secondary | ICD-10-CM | POA: Diagnosis not present

## 2020-11-04 DIAGNOSIS — E538 Deficiency of other specified B group vitamins: Secondary | ICD-10-CM | POA: Diagnosis not present

## 2020-11-04 LAB — CBC: RBC: 4.47 (ref 3.87–5.11)

## 2020-11-04 LAB — BASIC METABOLIC PANEL
BUN: 20 (ref 4–21)
CO2: 31 — AB (ref 13–22)
Chloride: 103 (ref 99–108)
Glucose: 99
Potassium: 4.6 (ref 3.4–5.3)
Sodium: 140 (ref 137–147)

## 2020-11-04 LAB — HEPATIC FUNCTION PANEL
ALT: 55 — AB (ref 7–35)
AST: 35 (ref 13–35)
Alkaline Phosphatase: 103 (ref 25–125)
Bilirubin, Total: 0.4

## 2020-11-04 LAB — CBC AND DIFFERENTIAL
HCT: 43 (ref 36–46)
Hemoglobin: 13.9 (ref 12.0–16.0)
Platelets: 319 (ref 150–399)
WBC: 6.4

## 2020-11-04 LAB — VITAMIN B12: Vitamin B-12: >1500

## 2020-11-04 LAB — COMPREHENSIVE METABOLIC PANEL
Albumin: 4.5 (ref 3.5–5.0)
Calcium: 10.3 (ref 8.7–10.7)

## 2020-11-04 LAB — TSH: TSH: 1.63 (ref 0.41–5.90)

## 2020-11-04 LAB — VITAMIN D 25 HYDROXY (VIT D DEFICIENCY, FRACTURES): Vit D, 25-Hydroxy: 86.5

## 2020-11-06 DIAGNOSIS — R569 Unspecified convulsions: Secondary | ICD-10-CM | POA: Diagnosis not present

## 2020-11-09 ENCOUNTER — Other Ambulatory Visit: Payer: Self-pay | Admitting: Cardiovascular Disease

## 2020-11-09 ENCOUNTER — Other Ambulatory Visit: Payer: Self-pay | Admitting: Physician Assistant

## 2020-11-09 DIAGNOSIS — F321 Major depressive disorder, single episode, moderate: Secondary | ICD-10-CM

## 2020-11-09 DIAGNOSIS — F411 Generalized anxiety disorder: Secondary | ICD-10-CM

## 2020-11-11 ENCOUNTER — Other Ambulatory Visit: Payer: Self-pay

## 2020-11-11 ENCOUNTER — Encounter: Payer: Self-pay | Admitting: Cardiovascular Disease

## 2020-11-11 ENCOUNTER — Ambulatory Visit: Payer: PPO | Admitting: Cardiovascular Disease

## 2020-11-11 VITALS — BP 122/68 | HR 82 | Ht 62.0 in | Wt 136.5 lb

## 2020-11-11 DIAGNOSIS — I700 Atherosclerosis of aorta: Secondary | ICD-10-CM

## 2020-11-11 DIAGNOSIS — R079 Chest pain, unspecified: Secondary | ICD-10-CM

## 2020-11-11 DIAGNOSIS — R002 Palpitations: Secondary | ICD-10-CM | POA: Diagnosis not present

## 2020-11-11 DIAGNOSIS — I10 Essential (primary) hypertension: Secondary | ICD-10-CM | POA: Diagnosis not present

## 2020-11-11 DIAGNOSIS — E785 Hyperlipidemia, unspecified: Secondary | ICD-10-CM

## 2020-11-11 DIAGNOSIS — I493 Ventricular premature depolarization: Secondary | ICD-10-CM | POA: Diagnosis not present

## 2020-11-11 MED ORDER — DILTIAZEM HCL ER COATED BEADS 120 MG PO CP24: 120.0000 mg | ORAL_CAPSULE | Freq: Every day | ORAL | 3 refills | Status: DC

## 2020-11-11 MED ORDER — PRAVASTATIN SODIUM 20 MG PO TABS: 20.0000 mg | ORAL_TABLET | Freq: Every evening | ORAL | 3 refills | Status: DC

## 2020-11-11 MED ORDER — EZETIMIBE 10 MG PO TABS: 10.0000 mg | ORAL_TABLET | Freq: Every day | ORAL | 3 refills | Status: DC

## 2020-11-11 NOTE — Progress Notes (Signed)
Date:  11/11/2020   ID:  Isabel Gonzalez, DOB [birth year], MRN [id number]  Patient Location:  [id number] [place] [address]   Provider location:   [facility], Central office  PCP:  [name], [name]  Cardiologist:  Arvid Right [facility]  Chief Complaint  Patient presents with  . BP follow up     Patient c/o fluctuating blood pressure. Medications reviewed by the patient verbally.     History of Present Illness:    Isabel Gonzalez is a 75 y.o. female past medical history of hyperlipidemia,  Anxiety/panic attacks remote smoking who stopped 25 years ago,  tachypalpitations, PVCs mild to moderate aortic atherosclerosis seen on CT scan Statin myalgias who presents for routine followup of her PAD,  Tachycardia  Walks frequently, Prior history labile pressure Reports blood pressure continues to be labile  [place] Trans[place] focal neurological symptoms of left hand and bilateral lower face paresthesia, language impairment, elevated blood pressure, total symptoms lasted for few hours. Had similar episode 4 years ago or so. Patient had extensive negative stroke work up.  Sx gone after several hours  Seen by neurology  Started On diltiazem ER 120 daily Blood pressure has been better  No recent anxiety attacks Continues to exercise on a regular basis No chest pain or shortness of breath   Reports she is back on her Zetia Tolerating pravastatin 20 mg daily,   Lab Results  Component Value Date   CHOL 194 11/03/2019   HDL 60 11/03/2019   LDLCALC 109 (H) 11/03/2019   TRIG 146 11/03/2019     Other past medical history reviewed Emergency surgery 3 weeks ago Severe ABD pain surgery, etiology  D/C 02/28/2017  inflammation of jejunum, umbilicus hernia fixed  previous  fall while she was running steps May 2016 Suffered a Left humerus fx 10/2014  Previously had trouble on Lipitor, Crestor, vytorin.  Reports having muscle ache   Prior CV studies:   The following studies were reviewed today:  Previous CT scan from 01/17/2013  showed mild to moderate descending aortic atherosclerotic plaque   CT scan of abdomen pelvis with contrast [facility] also documented aortic atherosclerosis   Past Medical History:  Diagnosis Date  . Anemia   . Anxiety   . Arthritis   . Cancer (HCC)    squamous cell  . Constipation   . Dysrhythmia    palpitations   . Environmental allergies   . GERD (gastroesophageal reflux disease)   . History of bladder infections   . Hyperlipidemia   . Murmur   . Urinary problem    Past Surgical History:  Procedure Laterality Date  . BREAST REDUCTION SURGERY    . FACIAL COSMETIC SURGERY    . LAPAROTOMY N/A 02/24/2017   Procedure: EXPLORATORY LAPAROTOMY and repair small hernia;  Surgeon: Jules Husbands, MD;  Location: [facility] ORS;  Service: General;  Laterality: N/A;  . ORIF HUMERUS FRACTURE Right 07/08/2015   Procedure: OPEN REDUCTION INTERNAL FIXATION (ORIF) HUMERAL SHAFT FRACTURE with bone graft and allograft;  Surgeon: Justice Britain, MD;  Location: [facility];  Service: Orthopedics;  Laterality: Right;  . SKIN SURGERY     squamous cell     Current Meds  Medication Sig  . ALPRAZolam (XANAX) 0.5 MG tablet TAKE 1/2 TO 1 TABLET BY MOUTH DAILY AS NEEDED  . Apoaequorin (PREVAGEN) 10 MG CAPS Take 10 mg by mouth daily.  Marland Kitchen  aspirin 81 MG EC tablet Take 81 mg by mouth daily.  . cetirizine (ZYRTEC) 10 MG tablet Take 10 mg by mouth daily.  . Cholecalciferol (VITAMIN D) 50 MCG (2000 UT) tablet Take 2,000 Units by mouth every other day.  . diltiazem (CARDIZEM) 30 MG tablet TAKE ONE TABLET 3 TIMES DAILY AS NEEDED  . escitalopram (LEXAPRO) 10 MG tablet Take 1 tablet (10 mg total) by mouth at bedtime.  . Esomeprazole Magnesium (NEXIUM 24HR PO) Take 1 tablet by mouth at bedtime.  . fluticasone (FLONASE) 50 MCG/ACT nasal spray 2 SPRAYS INTO BOTH NOSTRILS EVERY DAY  . Ginger,  Zingiber officinalis, (GINGER ROOT PO) Take by mouth every other day.  . latanoprost (XALATAN) 0.005 % ophthalmic solution Place 1 drop into both eyes at bedtime.  . Magnesium 500 MG CAPS Take by mouth every other day.  . Misc Natural Products (ELDERBERRY ZINC/VIT C/IMMUNE MT) Take 1 tablet by mouth daily at 6 (six) AM. With Vitamin D  . POTASSIUM PO Take by mouth daily.  . RESTASIS 0.05 % ophthalmic emulsion USE ONE DROP IN BOTH EYES TWICE DAILY  . Specialty Vitamins Products (ONE-A-DAY BONE STRENGTH PO) Take by mouth daily.  Marland Kitchen triamcinolone cream (KENALOG) 0.1 % Apply 1 application topically 2 (two) times daily.  . vitamin B-12 (CYANOCOBALAMIN) 1000 MCG tablet Take 1 tablet (1,000 mcg total) by mouth daily.  . vitamin C (ASCORBIC ACID) 500 MG tablet Take 500 mg by mouth daily.  Marland Kitchen XIIDRA 5 % SOLN Place 1 drop into both eyes 2 (two) times daily. Only using it once a day  . [DISCONTINUED] diltiazem (CARDIZEM CD) 120 MG 24 hr capsule Take 1 capsule (120 mg total) by mouth daily.  . [DISCONTINUED] ezetimibe (ZETIA) 10 MG tablet TAKE ONE TABLET BY MOUTH DAILY  . [DISCONTINUED] pravastatin (PRAVACHOL) 20 MG tablet TAKE ONE TABLET EVERY EVENING     Allergies:   Percocet [oxycodone-acetaminophen] and Vicodin [hydrocodone-acetaminophen]   Social History   Tobacco Use  . Smoking status: Former Smoker    Packs/day: 0.25    Years: 25.00    Pack years: 6.25    Quit date: 06/11/1990    Years since quitting: 30.4  . Smokeless tobacco: Never Used  Vaping Use  . Vaping Use: Never used  Substance Use Topics  . Alcohol use: Yes    Alcohol/week: 7.0 - 11.0 standard drinks    Types: 7 - 11 Glasses of wine per week    Comment: 1-1.5 daily  . Drug use: No     Family Hx: The patient's family history includes Diabetes in her brother; Stroke in her brother; Stroke (age of onset: 87) in her mother.  ROS:   Please see the history of present illness.    Review of Systems  Constitutional: Negative.    Respiratory: Negative.   Cardiovascular: Negative.   Gastrointestinal: Negative.   Musculoskeletal: Negative.   Neurological: Negative.   Psychiatric/Behavioral: Negative.   All other systems reviewed and are negative.    Labs/Other Tests and Data Reviewed:    Recent Labs: No results found for requested labs within last 8760 hours.   Recent Lipid Panel Lab Results  Component Value Date/Time   CHOL 194 11/03/2019 11:40 AM   TRIG 146 11/03/2019 11:40 AM   HDL 60 11/03/2019 11:40 AM   CHOLHDL 3.2 11/03/2019 11:40 AM   CHOLHDL 4.0 Ratio 08/24/2009 08:24 PM   LDLCALC 109 (H) 11/03/2019 11:40 AM    Wt Readings from Last 3 Encounters:  11/11/20 136 lb 8 oz (61.9 kg)  10/05/20 136 lb (61.7 kg)  02/17/20 135 lb (61.2 kg)     Exam:    Vital Signs: Vital signs may also be detailed in the HPI BP 122/68 (BP Location: Left Arm, Patient Position: Sitting, Cuff Size: Normal)   Pulse 82   Ht 5\' 2"  (1.575 m)   Wt 136 lb 8 oz (61.9 kg)   SpO2 99%   BMI 24.97 kg/m    Constitutional:  oriented to person, place, and time. No distress.  HENT:  Head: Grossly normal Eyes:  no discharge. No scleral icterus.  Neck: No JVD, no carotid bruits  Cardiovascular: Regular rate and rhythm, no murmurs appreciated Pulmonary/Chest: Clear to auscultation bilaterally, no wheezes or rails Abdominal: Soft.  no distension.  no tenderness.  Musculoskeletal: Normal range of motion Neurological:  normal muscle tone. Coordination normal. No atrophy Skin: Skin warm and dry Psychiatric: normal affect, pleasant  ASSESSMENT & PLAN:    Aortic atherosclerosis (HCC) Tolerating pravastatin 20 mg daily with Zetia Goal LDL less than 70 Will check on next visit for primary care  Frequent PVCs No significant symptoms on diltiazem  Chest pain, unspecified type Prior anxiety attack,  No recent symptoms  Hypercholesteremia Pravastatin Zetia refilled  TIA Seen by neurology, on aspirin, pravastatin,  Zetia For recurrent symptoms may need additional work-up She is [place] [facility]  Anxiety Stable, spending time at the beach Not on Xanax   Total encounter time more than 25 minutes  Greater than 50% was spent in counseling and coordination of care with the patient   Disposition: Follow-up in 12 months   Signed, Ida Rogue, MD  11/11/2020 2:45 PM    [id number], [place], [place] [address]

## 2020-11-11 NOTE — Patient Instructions (Signed)
Medication Instructions:  No changes  If you need a refill on your cardiac medications before your next appointment, please call your pharmacy.    Lab work: No new labs needed   If you have labs (blood work) drawn today and your tests are completely normal, you will receive your results only by: . MyChart Message (if you have MyChart) OR . A paper copy in the mail If you have any lab test that is abnormal or we need to change your treatment, we will call you to review the results.   Testing/Procedures: No new testing needed   Follow-Up: At [facility], you and your health needs are our priority.  As part of our continuing mission to provide you with exceptional heart care, we have created designated Provider Care Teams.  These Care Teams include your primary Cardiologist (physician) and Advanced Practice Providers (APPs -  Physician Assistants and Nurse Practitioners) who all work together to provide you with the care you need, when you need it.  . You will need a follow up appointment in 12 months  . Providers on your designated Care Team:   . Christopher Berge, NP . Ryan Dunn, [name] . Jacquelyn Visser, [name]  Any Other Special Instructions Will Be Listed Below (If Applicable).  COVID-19 Vaccine Information can be found at: [email or web address] For questions related to vaccine distribution or appointments, please email [email or web address] or call [phone].     

## 2020-11-16 ENCOUNTER — Other Ambulatory Visit: Payer: Self-pay

## 2020-11-16 NOTE — Telephone Encounter (Signed)
Pt called about confusion on who refills her Rx for escitalopram (LEXAPRO) 10 MG tablet  Isabel Gonzalez was filling this medication and Isabel Gonzalez will refill for pt until August appt with Dr. Jacinto Reap / FYI/ Pt was disconnected when transferring back to her to inform

## 2020-12-07 ENCOUNTER — Ambulatory Visit: Payer: Self-pay | Admitting: Family Medicine

## 2021-01-03 ENCOUNTER — Other Ambulatory Visit: Payer: Self-pay | Admitting: Physician Assistant

## 2021-01-03 DIAGNOSIS — J301 Allergic rhinitis due to pollen: Secondary | ICD-10-CM

## 2021-01-11 ENCOUNTER — Other Ambulatory Visit: Payer: Self-pay

## 2021-01-11 ENCOUNTER — Ambulatory Visit (INDEPENDENT_AMBULATORY_CARE_PROVIDER_SITE_OTHER): Payer: PPO | Admitting: Family Medicine

## 2021-01-11 ENCOUNTER — Encounter: Payer: Self-pay | Admitting: Family Medicine

## 2021-01-11 VITALS — BP 148/80 | HR 78 | Temp 97.9°F | Ht 61.0 in | Wt 139.0 lb

## 2021-01-11 DIAGNOSIS — Z0000 Encounter for general adult medical examination without abnormal findings: Secondary | ICD-10-CM | POA: Diagnosis not present

## 2021-01-11 DIAGNOSIS — R7303 Prediabetes: Secondary | ICD-10-CM | POA: Diagnosis not present

## 2021-01-11 DIAGNOSIS — E7800 Pure hypercholesterolemia, unspecified: Secondary | ICD-10-CM

## 2021-01-11 DIAGNOSIS — F3341 Major depressive disorder, recurrent, in partial remission: Secondary | ICD-10-CM

## 2021-01-11 DIAGNOSIS — E538 Deficiency of other specified B group vitamins: Secondary | ICD-10-CM

## 2021-01-11 DIAGNOSIS — F321 Major depressive disorder, single episode, moderate: Secondary | ICD-10-CM | POA: Insufficient documentation

## 2021-01-11 DIAGNOSIS — F411 Generalized anxiety disorder: Secondary | ICD-10-CM | POA: Diagnosis not present

## 2021-01-11 DIAGNOSIS — F329 Major depressive disorder, single episode, unspecified: Secondary | ICD-10-CM | POA: Insufficient documentation

## 2021-01-11 MED ORDER — ESCITALOPRAM OXALATE 10 MG PO TABS: 10.0000 mg | ORAL_TABLET | Freq: Every day | ORAL | 3 refills | Status: DC

## 2021-01-11 NOTE — Assessment & Plan Note (Signed)
Chronic and stable Continue lexapro at current dose 

## 2021-01-11 NOTE — Assessment & Plan Note (Signed)
Recommend low carb diet °Recheck A1c  °

## 2021-01-11 NOTE — Progress Notes (Signed)
Complete physical exam   Patient: Isabel Gonzalez   DOB: 1945/12/27   75 y.o. Female  MRN: [id number] Visit Date: '@ENCDATE'$ @  Today's healthcare provider: Lavon Paganini, MD   Chief Complaint  Patient presents with   Depression   Anxiety   Subjective    Isabel Gonzalez is a 75 y.o. female who presents today for a complete physical exam.  She reports consuming a general diet. Home exercise routine includes walking 1 hrs per day. She generally feels well. She reports sleeping well. She does not have additional problems to discuss today.  Depression        Associated symptoms include no fatigue, no myalgias and no headaches.  Past medical history includes anxiety.   Anxiety Patient reports no chest pain, dizziness, nausea, palpitations or shortness of breath.   She is no longer taking xanax. She was taking this due to her husbands diagnosis of lukemia. She has stopped this medication 3 months ago and she has been doing well since. Her mood is well managed. She is tolerating 10 mg lexapro.   Screenings She no longer is receiving mammograms. Her last colonoscopy since 2013 and she has since aged out of this screening. She takes calcium and vitamin D for her osteopenia. She is current with her bone density screening   Vaccines  She is current on her COVID and shingrix vaccine. She will considered tetanus at [facility].   Fall incident She had a fall in her house while dancing back in March 2022. She hit the anterior and posterior area of her head. She was having memory loss. Dr. Manuella Ghazi recommends an EEG to determine if the fall was related to a seizure. However she doesn't believe it was a seizure because she was conscious and she's not interested in receiving another work-up.   Ear dermatitis  She has flaky, itchy and skin irritation of her right ear. She is requesting for recommendation for relief.   Past Medical History:  Diagnosis Date   Anemia    Anxiety     Arthritis    Cancer ([place])    squamous cell   Constipation    Dysrhythmia    palpitations    Environmental allergies    GERD (gastroesophageal reflux disease)    History of bladder infections    Hyperlipidemia    Murmur    Urinary problem    Past Surgical History:  Procedure Laterality Date   BREAST REDUCTION SURGERY     FACIAL COSMETIC SURGERY     LAPAROTOMY N/A 02/24/2017   Procedure: EXPLORATORY LAPAROTOMY and repair small hernia;  Surgeon: Jules Husbands, MD;  Location: [facility];  Service: General;  Laterality: N/A;   ORIF HUMERUS FRACTURE Right 07/08/2015   Procedure: OPEN REDUCTION INTERNAL FIXATION (ORIF) HUMERAL SHAFT FRACTURE with bone graft and allograft;  Surgeon: Justice Britain, MD;  Location: [place];  Service: Orthopedics;  Laterality: Right;   SKIN SURGERY     squamous cell   Social History   Socioeconomic History   Marital status: Widowed    Spouse name: Not on file   Number of children: 3   Years of education: Not on file   Highest education level: Some college, no degree  Occupational History   Occupation: retired  Tobacco Use   Smoking status: Former    Packs/day: 0.25    Years: 25.00    Pack years: 6.25    Types: Cigarettes    Quit date: 06/11/1990  Years since quitting: 30.6   Smokeless tobacco: Never  Vaping Use   Vaping Use: Never used  Substance and Sexual Activity   Alcohol use: Yes    Alcohol/week: 7.0 - 11.0 standard drinks    Types: 7 - 11 Glasses of wine per week    Comment: 1-1.5 daily   Drug use: No   Sexual activity: Not on file  Other Topics Concern   Not on file  Social History Narrative   Regular exercise         Social Determinants of Health   Financial Resource Strain: Low Risk    Difficulty of Paying Living Expenses: Not hard at all  Food Insecurity: No Food Insecurity   Worried About Charity fundraiser in the Last Year: Never true   Ran Out of Food in the Last Year: Never true  Transportation Needs: No Transportation  Needs   Lack of Transportation (Medical): No   Lack of Transportation (Non-Medical): No  Physical Activity: Sufficiently Active   Days of Exercise per Week: 5 days   Minutes of Exercise per Session: 40 min  Stress: No Stress Concern Present   Feeling of Stress : Not at all  Social Connections: Moderately Integrated   Frequency of Communication with Friends and Family: More than three times a week   Frequency of Social Gatherings with Friends and Family: More than three times a week   Attends Religious Services: More than 4 times per year   Active Member of [employer] or Organizations: Yes   Attends Archivist Meetings: More than 4 times per year   Marital Status: Widowed  Human resources officer Violence: Not At Risk   Fear of Current or Ex-Partner: No   Emotionally Abused: No   Physically Abused: No   Sexually Abused: No   Family Status  Relation Name Status   Mother  Deceased at age 54       stroke   Father  Deceased at age 41       heart problems   Brother  Alive       mini strokes   Family History  Problem Relation Age of Onset   Stroke Mother 10   Diabetes Brother    Stroke Brother    Allergies  Allergen Reactions   Percocet [Oxycodone-Acetaminophen] Nausea And Vomiting   Vicodin [Hydrocodone-Acetaminophen] Nausea And Vomiting    Patient Care Team: Kewanda Poland, Dionne Bucy, MD as PCP - General (Family Medicine) Rockey Situ, Kathlene November, MD as PCP - Cardiology (Cardiology) Minna Merritts, MD as Consulting Physician (Cardiology) Oneta Rack, MD as Consulting Physician (Dermatology) Cleaster Corin, OD (Optometry)   Medications: Outpatient Medications Prior to Visit  Medication Sig   Apoaequorin (PREVAGEN) 10 MG CAPS Take 10 mg by mouth daily.   aspirin 81 MG EC tablet Take 81 mg by mouth daily.   cetirizine (ZYRTEC) 10 MG tablet Take 10 mg by mouth daily.   Cholecalciferol (VITAMIN D) 50 MCG (2000 UT) tablet Take 2,000 Units by mouth every other day.   diltiazem  (CARDIZEM CD) 120 MG 24 hr capsule Take 1 capsule (120 mg total) by mouth daily.   diltiazem (CARDIZEM) 30 MG tablet TAKE ONE TABLET 3 TIMES DAILY AS NEEDED   Esomeprazole Magnesium (NEXIUM 24HR PO) Take 1 tablet by mouth at bedtime.   ezetimibe (ZETIA) 10 MG tablet Take 1 tablet (10 mg total) by mouth daily.   fluticasone (FLONASE) 50 MCG/ACT nasal spray 2 SPRAYS INTO BOTH NOSTRILS EVERY DAY  Ginger, Zingiber officinalis, (GINGER ROOT PO) Take by mouth every other day.   latanoprost (XALATAN) 0.005 % ophthalmic solution Place 1 drop into both eyes at bedtime.   Magnesium 500 MG CAPS Take by mouth every other day.   Misc Natural Products (ELDERBERRY ZINC/VIT C/IMMUNE MT) Take 1 tablet by mouth daily at 6 (six) AM. With Vitamin D   POTASSIUM PO Take by mouth daily.   pravastatin (PRAVACHOL) 20 MG tablet Take 1 tablet (20 mg total) by mouth every evening.   RESTASIS 0.05 % ophthalmic emulsion USE ONE DROP IN BOTH EYES TWICE DAILY   Specialty Vitamins Products (ONE-A-DAY BONE STRENGTH PO) Take by mouth daily.   triamcinolone cream (KENALOG) 0.1 % Apply 1 application topically 2 (two) times daily.   vitamin B-12 (CYANOCOBALAMIN) 1000 MCG tablet Take 1 tablet (1,000 mcg total) by mouth daily.   vitamin C (ASCORBIC ACID) 500 MG tablet Take 500 mg by mouth daily.   XIIDRA 5 % SOLN Place 1 drop into both eyes 2 (two) times daily. Only using it once a day   [DISCONTINUED] ALPRAZolam (XANAX XR) 0.5 MG 24 hr tablet Take by mouth.   [DISCONTINUED] escitalopram (LEXAPRO) 10 MG tablet TAKE 1 TABLET BY MOUTH AT BEDTIME   [DISCONTINUED] ALPRAZolam (XANAX) 0.5 MG tablet TAKE 1/2 TO 1 TABLET BY MOUTH DAILY AS NEEDED   No facility-administered medications prior to visit.    Review of Systems  Constitutional:  Negative for chills, diaphoresis, fatigue and fever.  HENT:  Negative for ear pain, sinus pressure, sinus pain and sore throat.   Eyes:  Negative for pain and visual disturbance.  Respiratory:   Negative for cough, chest tightness, shortness of breath and wheezing.   Cardiovascular:  Negative for chest pain, palpitations and leg swelling.  Gastrointestinal:  Negative for abdominal pain, blood in stool, constipation, diarrhea, nausea and vomiting.  Genitourinary:  Negative for dysuria, flank pain, frequency, pelvic pain and urgency.  Musculoskeletal:  Negative for back pain, myalgias and neck pain.  Neurological:  Negative for dizziness, seizures, syncope, weakness, light-headedness, numbness and headaches.  Psychiatric/Behavioral:  Positive for depression.     Objective    BP (!) 148/80 (BP Location: Left Arm)   Pulse 78   Temp 97.9 F (36.6 C) (Oral)   Ht '5\' 1"'$  (1.549 m)   Wt 139 lb (63 kg)   SpO2 97%   BMI 26.26 kg/m    Physical Exam Vitals reviewed.  Constitutional:      General: She is not in acute distress.    Appearance: Normal appearance. She is well-developed. She is not diaphoretic.  HENT:     Head: Normocephalic and atraumatic.     Right Ear: Tympanic membrane, ear canal and external ear normal.     Left Ear: Tympanic membrane, ear canal and external ear normal.     Nose: Nose normal.     Mouth/Throat:     Mouth: Mucous membranes are moist.     Pharynx: Oropharynx is clear. No oropharyngeal exudate.  Eyes:     General: No scleral icterus.    Conjunctiva/sclera: Conjunctivae normal.     Pupils: Pupils are equal, round, and reactive to light.  Neck:     Thyroid: No thyromegaly.  Cardiovascular:     Rate and Rhythm: Normal rate and regular rhythm.     Pulses: Normal pulses.     Heart sounds: Normal heart sounds. No murmur heard. Pulmonary:     Effort: Pulmonary effort is normal. No respiratory  distress.     Breath sounds: Normal breath sounds. No wheezing or rales.  Abdominal:     General: There is no distension.     Palpations: Abdomen is soft.     Tenderness: There is no abdominal tenderness.  Musculoskeletal:        General: No deformity.      Cervical back: Neck supple.     Right lower leg: No edema.     Left lower leg: No edema.  Lymphadenopathy:     Cervical: No cervical adenopathy.  Skin:    General: Skin is warm and dry.     Findings: No rash.  Neurological:     Mental Status: She is alert and oriented to person, place, and time. Mental status is at baseline.     Sensory: No sensory deficit.     Motor: No weakness.     Gait: Gait normal.  Psychiatric:        Mood and Affect: Mood normal.        Behavior: Behavior normal.        Thought Content: Thought content normal.     Last depression screening scores PHQ 2/9 Scores 01/11/2021 06/30/2020 04/19/2020  PHQ - 2 Score 0 0 4  PHQ- 9 Score 0 - 17   Last fall risk screening Fall Risk  01/11/2021  Falls in the past year? 0  Number falls in past yr: 1  Injury with Fall? 0  Risk for fall due to : History of fall(s)  Follow up Falls evaluation completed   Last Audit-C alcohol use screening Alcohol Use Disorder Test (AUDIT) 01/11/2021  1. How often do you have a drink containing alcohol? 1  2. How many drinks containing alcohol do you have on a typical day when you are drinking? 0  3. How often do you have six or more drinks on one occasion? 0  AUDIT-C Score 1  4. How often during the last year have you found that you were not able to stop drinking once you had started? -  5. How often during the last year have you failed to do what was normally expected from you because of drinking? -  6. How often during the last year have you needed a first drink in the morning to get yourself going after a heavy drinking session? -  7. How often during the last year have you had a feeling of guilt of remorse after drinking? -  8. How often during the last year have you been unable to remember what happened the night before because you had been drinking? -  9. Have you or someone else been injured as a result of your drinking? -  10. Has a relative or friend or a doctor or another health  worker been concerned about your drinking or suggested you cut down? -  Alcohol Use Disorder Identification Test Final Score (AUDIT) -  Alcohol Brief Interventions/Follow-up -   A score of 3 or more in women, and 4 or more in men indicates increased risk for alcohol abuse, EXCEPT if all of the points are from question 1   Results for orders placed or performed in visit on 01/11/21  CBC and differential  Result Value Ref Range   Hemoglobin 13.9 12.0 - 16.0   HCT 43 36 - 46   Platelets 319 150 - 399   WBC 6.4   CBC  Result Value Ref Range   RBC 4.47 3.87 - 5.11  VITAMIN D  25 Hydroxy (Vit-D Deficiency, Fractures)  Result Value Ref Range   Vit D, 25-Hydroxy 0000000   Basic metabolic panel  Result Value Ref Range   Glucose 99    BUN 20 4 - 21   CO2 31 (A) 13 - 22   Potassium 4.6 3.4 - 5.3   Sodium 140 137 - 147   Chloride 103 99 - 108  Comprehensive metabolic panel  Result Value Ref Range   Calcium 10.3 8.7 - 10.7   Albumin 4.5 3.5 - 5.0  Hepatic function panel  Result Value Ref Range   Alkaline Phosphatase 103 25 - 125   ALT 55 (A) 7 - 35   AST 35 13 - 35   Bilirubin, Total 0.4   Vitamin B12  Result Value Ref Range   Vitamin B-12 >1,500   TSH  Result Value Ref Range   TSH 1.63 0.41 - 5.90    Assessment & Plan     Problem List Items Addressed This Visit       Other   GAD (generalized anxiety disorder)    Chronic and well controlled Continue lexapro at current dose No longer on xanax       Relevant Medications   escitalopram (LEXAPRO) 10 MG tablet   Hypercholesteremia    Previously well controlled Continue statin Repeat FLP and CMP       Relevant Orders   Lipid panel   B12 deficiency   Relevant Orders   B12   MDD (major depressive disorder)    Chronic and stable Continue lexapro at current dose       Relevant Medications   escitalopram (LEXAPRO) 10 MG tablet   Prediabetes    Recommend low carb diet Recheck A1c       Relevant Orders    Hemoglobin A1c   Other Visit Diagnoses     Encounter for annual physical exam    -  Primary   Relevant Orders   Lipid panel   B12   Hemoglobin A1c       Routine Health Maintenance and Physical Exam  Exercise Activities and Dietary recommendations  Goals      DIET - INCREASE WATER INTAKE     Recommend to drink at least 6-8 8oz glasses of water per day.        Immunization History  Administered Date(s) Administered   Influenza Split 03/29/2009   Influenza, High Dose Seasonal PF 03/24/2020   Influenza-Unspecified 03/20/2018, 02/12/2019   PFIZER(Purple Top)SARS-COV-2 Vaccination 06/18/2019, 07/09/2019, 04/12/2020   Pneumococcal Conjugate-13 12/10/2013   Pneumococcal Polysaccharide-23 06/02/2010   Td 06/29/2003   Zoster Recombinat (Shingrix) 09/07/2017   Zoster, Live 04/18/2006    Health Maintenance  Topic Date Due   Zoster Vaccines- Shingrix (2 of 2) 11/02/2017   INFLUENZA VACCINE  01/10/2021   COVID-19 Vaccine (4 - Booster for [place] series) 01/27/2021 (Originally 07/13/2020)   TETANUS/TDAP  03/09/2023 (Originally 06/28/2013)   DEXA SCAN  03/15/2023   Hepatitis C Screening  Completed   PNA vac Low Risk Adult  Completed   HPV VACCINES  Aged Out    Discussed health benefits of physical activity, and encouraged her to engage in regular exercise appropriate for her age and condition.   Return in about 1 year (around 01/11/2022) for AWV, CPE.     I,Essence Turner,acting as a scribe for Lavon Paganini, MD.,have documented all relevant documentation on the behalf of Lavon Paganini, MD,as directed by  Lavon Paganini, MD while in the presence of Levada Dy  Brita Romp, MD.  I, Lavon Paganini, MD, have reviewed all documentation for this visit. The documentation on 01/11/21 for the exam, diagnosis, procedures, and orders are all accurate and complete.   Zandyr Barnhill, Dionne Bucy, MD, MPH [employer]

## 2021-01-11 NOTE — Assessment & Plan Note (Signed)
Previously well controlled Continue statin Repeat FLP and CMP  

## 2021-01-11 NOTE — Assessment & Plan Note (Signed)
Chronic and well controlled Continue lexapro at current dose No longer on xanax

## 2021-01-12 ENCOUNTER — Encounter: Payer: Self-pay | Admitting: Family Medicine

## 2021-01-12 LAB — HEMOGLOBIN A1C
Est. average glucose Bld gHb Est-mCnc: 117 mg/dL
Hgb A1c MFr Bld: 5.7 % — ABNORMAL HIGH (ref 4.8–5.6)

## 2021-01-12 LAB — LIPID PANEL
Chol/HDL Ratio: 3.3 ratio (ref 0.0–4.4)
Cholesterol, Total: 157 mg/dL (ref 100–199)
HDL: 48 mg/dL (ref >39)
LDL Chol Calc (NIH): 79 mg/dL (ref 0–99)
Triglycerides: 178 mg/dL — ABNORMAL HIGH (ref 0–149)
VLDL Cholesterol Cal: 30 mg/dL (ref 5–40)

## 2021-01-12 LAB — VITAMIN B12: Vitamin B-12: >2000 pg/mL — ABNORMAL HIGH (ref 232–1245)

## 2021-02-16 ENCOUNTER — Other Ambulatory Visit: Payer: Self-pay | Admitting: Cardiovascular Disease

## 2021-05-16 DIAGNOSIS — H401131 Primary open-angle glaucoma, bilateral, mild stage: Secondary | ICD-10-CM | POA: Diagnosis not present

## 2021-07-06 ENCOUNTER — Other Ambulatory Visit: Payer: Self-pay

## 2021-07-06 ENCOUNTER — Ambulatory Visit (INDEPENDENT_AMBULATORY_CARE_PROVIDER_SITE_OTHER): Payer: PPO

## 2021-07-06 VITALS — BP 134/70 | HR 72 | Temp 97.9°F | Ht 61.0 in | Wt 144.1 lb

## 2021-07-06 DIAGNOSIS — Z0000 Encounter for general adult medical examination without abnormal findings: Secondary | ICD-10-CM | POA: Diagnosis not present

## 2021-07-06 NOTE — Progress Notes (Signed)
Subjective:   Katherine Tout is a 76 y.o. female who presents for Medicare Annual (Subsequent) preventive examination.  Review of Systems           Objective:    Today's Vitals   07/06/21 1508  BP: 134/70  Pulse: 72  Temp: 97.9 F (36.6 C)  TempSrc: Oral  SpO2: 100%  Weight: 144 lb 1.6 oz (65.4 kg)  Height: 5\' 1"  (1.549 m)   Body mass index is 27.23 kg/m.  Advanced Directives 06/30/2020 02/19/2019 03/13/2017 03/06/2017 02/23/2017 03/27/2016 07/08/2015  Does Patient Have a Medical Advance Directive? Yes Yes Yes Yes Yes Yes Yes  Type of Paramedic of [place];Living will [place];Living will [place];Living will [place];Living will Living will;Healthcare Power of [place];Living will [place];Living will  Does patient want to make changes to medical advance directive? - - - No - Patient declined No - Patient declined - -  Copy of [place] in Chart? Yes - validated most recent copy scanned in chart (See row information) Yes - validated most recent copy scanned in chart (See row information) No - copy requested - Yes - Yes    Current Medications (verified) Outpatient Encounter Medications as of 07/06/2021  Medication Sig   Apoaequorin (PREVAGEN) 10 MG CAPS Take 10 mg by mouth daily.   aspirin 81 MG EC tablet Take 81 mg by mouth daily.   bimatoprost (LUMIGAN) 0.03 % ophthalmic solution    cetirizine (ZYRTEC) 10 MG tablet Take 10 mg by mouth daily.   Cholecalciferol (VITAMIN D) 50 MCG (2000 UT) tablet Take 2,000 Units by mouth every other day.   diltiazem (CARDIZEM CD) 120 MG 24 hr capsule Take 1 capsule (120 mg total) by mouth daily.   diltiazem (CARDIZEM) 30 MG tablet TAKE ONE TABLET 3 TIMES DAILY AS NEEDED   escitalopram (LEXAPRO) 10 MG tablet Take 1 tablet (10 mg total) by mouth at bedtime.   Esomeprazole Magnesium (NEXIUM  24HR PO) Take 1 tablet by mouth at bedtime.   ezetimibe (ZETIA) 10 MG tablet TAKE ONE TABLET BY MOUTH DAILY   fluticasone (FLONASE) 50 MCG/ACT nasal spray 2 SPRAYS INTO BOTH NOSTRILS EVERY DAY   FLUZONE HIGH-DOSE QUADRIVALENT 0.7 ML SUSY    Ginger, Zingiber officinalis, (GINGER ROOT PO) Take by mouth every other day.   latanoprost (XALATAN) 0.005 % ophthalmic solution Place 1 drop into both eyes at bedtime.   Magnesium 500 MG CAPS Take by mouth every other day.   Misc Natural Products (ELDERBERRY ZINC/VIT C/IMMUNE MT) Take 1 tablet by mouth daily at 6 (six) AM. With Vitamin D   PFIZER COVID-19 VAC BIVALENT injection    POTASSIUM PO Take by mouth daily.   pravastatin (PRAVACHOL) 20 MG tablet Take 1 tablet (20 mg total) by mouth every evening.   RESTASIS 0.05 % ophthalmic emulsion USE ONE DROP IN BOTH EYES TWICE DAILY   Specialty Vitamins Products (ONE-A-DAY BONE STRENGTH PO) Take by mouth daily.   triamcinolone cream (KENALOG) 0.1 % Apply 1 application topically 2 (two) times daily.   vitamin B-12 (CYANOCOBALAMIN) 1000 MCG tablet Take 1 tablet (1,000 mcg total) by mouth daily.   vitamin C (ASCORBIC ACID) 500 MG tablet Take 500 mg by mouth daily.   XIIDRA 5 % SOLN Place 1 drop into both eyes 2 (two) times daily. Only using it once a day   No facility-administered encounter medications on  file as of 07/06/2021.    Allergies (verified) Percocet [oxycodone-acetaminophen] and Vicodin [hydrocodone-acetaminophen]   History: Past Medical History:  Diagnosis Date   Anemia    Anxiety    Arthritis    Cancer ([place])    squamous cell   Constipation    Dysrhythmia    palpitations    Environmental allergies    GERD (gastroesophageal reflux disease)    History of bladder infections    Hyperlipidemia    Murmur    Urinary problem    Past Surgical History:  Procedure Laterality Date   BREAST REDUCTION SURGERY     FACIAL COSMETIC SURGERY     LAPAROTOMY N/A 02/24/2017   Procedure: EXPLORATORY  LAPAROTOMY and repair small hernia;  Surgeon: Jules Husbands, MD;  Location: [facility];  Service: General;  Laterality: N/A;   ORIF HUMERUS FRACTURE Right 07/08/2015   Procedure: OPEN REDUCTION INTERNAL FIXATION (ORIF) HUMERAL SHAFT FRACTURE with bone graft and allograft;  Surgeon: Justice Britain, MD;  Location: [facility];  Service: Orthopedics;  Laterality: Right;   SKIN SURGERY     squamous cell   Family History  Problem Relation Age of Onset   Stroke Mother 47   Diabetes Brother    Stroke Brother    Social History   Socioeconomic History   Marital status: Widowed    Spouse name: Not on file   Number of children: 3   Years of education: Not on file   Highest education level: Some college, no degree  Occupational History   Occupation: retired  Tobacco Use   Smoking status: Former    Packs/day: 0.25    Years: 25.00    Pack years: 6.25    Types: Cigarettes    Quit date: 06/11/1990    Years since quitting: 31.0   Smokeless tobacco: Never  Vaping Use   Vaping Use: Never used  Substance and Sexual Activity   Alcohol use: Yes    Alcohol/week: 7.0 - 11.0 standard drinks    Types: 7 - 11 Glasses of wine per week    Comment: 1-1.5 daily   Drug use: No   Sexual activity: Not on file  Other Topics Concern   Not on file  Social History Narrative   Regular exercise         Social Determinants of Health   Financial Resource Strain: Not on file  Food Insecurity: Not on file  Transportation Needs: Not on file  Physical Activity: Not on file  Stress: Not on file  Social Connections: Not on file    Tobacco Counseling Counseling given: Not Answered   Clinical Intake:  Pre-visit preparation completed: Yes  Pain : No/denies pain     Nutritional Risks: None Diabetes: No  How often do you need to have someone help you when you read instructions, pamphlets, or other written materials from your doctor or pharmacy?: 1 - Never  Diabetic?no  Interpreter Needed?:  No  Information entered by :: Kirke Shaggy, LPN   Activities of Daily Living In your present state of health, do you have any difficulty performing the following activities: 01/11/2021  Hearing? N  Vision? N  Difficulty concentrating or making decisions? N  Walking or climbing stairs? N  Dressing or bathing? N  Doing errands, shopping? N  Some recent data might be hidden    Patient Care Team: Bacigalupo, Dionne Bucy, MD as PCP - General (Family Medicine) Minna Merritts, MD as PCP - Cardiology (Cardiology) Minna Merritts, MD as  Consulting Physician (Cardiology) Oneta Rack, MD as Consulting Physician (Dermatology) Cleaster Corin, OD (Optometry)  Indicate any recent Medical Services you may have received from other than [facility] providers in the past year (date may be approximate).     Assessment:   This is a routine wellness examination for Amor Hyle.  Hearing/Vision screen No results found.  Dietary issues and exercise activities discussed:     Goals Addressed   None    Depression Screen PHQ 2/9 Scores 01/11/2021 06/30/2020 04/19/2020 02/19/2019 02/06/2018 03/13/2017 03/13/2017  PHQ - 2 Score 0 0 4 0 0 0 0  PHQ- 9 Score 0 - 17 - 0 2 -    Fall Risk Fall Risk  01/11/2021 06/30/2020 07/14/2019 02/19/2019 02/06/2018  Falls in the past year? 0 0 0 0 No  Number falls in past yr: 1 0 0 - -  Injury with Fall? 0 0 0 - -  Risk for fall due to : History of fall(s) - - - -  Follow up Falls evaluation completed - Falls evaluation completed - -    FALL RISK PREVENTION PERTAINING TO THE HOME:  Any stairs in or around the home? No  If so, are there any without handrails? No  Home free of loose throw rugs in walkways, pet beds, electrical cords, etc? Yes  Adequate lighting in your home to reduce risk of falls? Yes   ASSISTIVE DEVICES UTILIZED TO PREVENT FALLS:  Life alert? Yes  Use of a cane, walker or w/c? No  Grab bars in the bathroom? No  Shower chair or bench in shower? No   Elevated toilet seat or a handicapped toilet? Yes   TIMED UP AND GO:  Was the test performed? Yes .  Length of time to ambulate 10 feet: 4 sec.   Gait steady and fast without use of assistive device  Cognitive Function:        Immunizations Immunization History  Administered Date(s) Administered   Fluad Quad(high Dose 65+) 03/08/2021   Influenza Split 03/29/2009   Influenza, High Dose Seasonal PF 03/24/2020   Influenza-Unspecified 03/20/2018, 02/12/2019, 03/08/2021   PFIZER(Purple Top)SARS-COV-2 Vaccination 06/18/2019, 07/09/2019, 04/12/2020   Pneumococcal Conjugate-13 12/10/2013   Pneumococcal Polysaccharide-23 06/02/2010   Td 06/29/2003   Zoster Recombinat (Shingrix) 09/07/2017   Zoster, Live 04/18/2006    TDAP status: Due, Education has been provided regarding the importance of this vaccine. Advised may receive this vaccine at local pharmacy or Health Dept. Aware to provide a copy of the vaccination record if obtained from local pharmacy or Health Dept. Verbalized acceptance and understanding.  Flu Vaccine status: Up to date  Pneumococcal vaccine status: Up to date  Covid-19 vaccine status: Completed vaccines  Qualifies for Shingles Vaccine? Yes   Zostavax completed Yes   Shingrix Completed?: No.    Education has been provided regarding the importance of this vaccine. Patient has been advised to call insurance company to determine out of pocket expense if they have not yet received this vaccine. Advised may also receive vaccine at local pharmacy or Health Dept. Verbalized acceptance and understanding.  Screening Tests Health Maintenance  Topic Date Due   Zoster Vaccines- Shingrix (2 of 2) 11/02/2017   COVID-19 Vaccine (4 - Booster for Pfizer series) 06/07/2020   TETANUS/TDAP  03/09/2023 (Originally 06/28/2013)   DEXA SCAN  03/15/2023   Pneumonia Vaccine 17+ Years old  Completed   INFLUENZA VACCINE  Completed   Hepatitis C Screening  Completed   HPV VACCINES   Aged  Out   COLONOSCOPY (Pts 45-42yrs Insurance coverage will need to be confirmed)  Discontinued    Health Maintenance  Health Maintenance Due  Topic Date Due   Zoster Vaccines- Shingrix (2 of 2) 11/02/2017   COVID-19 Vaccine (4 - Booster for Pfizer series) 06/07/2020    Colorectal cancer screening: Type of screening: Colonoscopy. Completed 04/29/12. Repeat every (aged out) years  Mammogram status: No longer required due to age.  Bone Density status: Completed 03/14/18. Results reflect: Bone density results: NORMAL. Repeat every 5 years.  Lung Cancer Screening: (Low Dose CT Chest recommended if Age 55-80 years, 30 pack-year currently smoking OR have quit w/in 15years.) does not qualify.    Additional Screening:  Hepatitis C Screening: does qualify; Completed 02/06/18  Vision Screening: Recommended annual ophthalmology exams for early detection of glaucoma and other disorders of the eye. Is the patient up to date with their annual eye exam?  Yes  Who is the provider or what is the name of the office in which the patient attends annual eye exams? [facility] If pt is not established with a provider, would they like to be referred to a provider to establish care? No .   Dental Screening: Recommended annual dental exams for proper oral hygiene  Community Resource Referral / Chronic Care Management: CRR required this visit?  No   CCM required this visit?  No      Plan:     I have personally reviewed and noted the following in the patients chart:   Medical and social history Use of alcohol, tobacco or illicit drugs  Current medications and supplements including opioid prescriptions.  Functional ability and status Nutritional status Physical activity Advanced directives List of other physicians Hospitalizations, surgeries, and ER visits in previous 12 months Vitals Screenings to include cognitive, depression, and falls Referrals and appointments  In addition,  I have reviewed and discussed with patient certain preventive protocols, quality metrics, and best practice recommendations. A written personalized care plan for preventive services as well as general preventive health recommendations were provided to patient.     Dionisio David, LPN   08/[id number]   Nurse Notes: none

## 2021-07-06 NOTE — Patient Instructions (Signed)
Isabel Gonzalez , Thank you for taking time to come for your Medicare Wellness Visit. I appreciate your ongoing commitment to your health goals. Please review the following plan we discussed and let me know if I can assist you in the future.   Screening recommendations/referrals: Colonoscopy: 04/29/12 Mammogram: n/d Bone Density: 03/14/18 Recommended yearly ophthalmology/optometry visit for glaucoma screening and checkup Recommended yearly dental visit for hygiene and checkup  Vaccinations: Influenza vaccine: 03/08/21 Pneumococcal vaccine: 12/10/13 Tdap vaccine: 06/29/03, due Shingles vaccine: Zostavax 04/18/06   Covid-19:06/18/19, 07/09/19, 04/12/20, 04/03/21  Advanced directives: yes, requested copy  Conditions/risks identified: none   Next appointment: Follow up in one year for your annual wellness visit -declined   Preventive Care 30 Years and Older, Female Preventive care refers to lifestyle choices and visits with your health care provider that can promote health and wellness. What does preventive care include? A yearly physical exam. This is also called an annual well check. Dental exams once or twice a year. Routine eye exams. Ask your health care provider how often you should have your eyes checked. Personal lifestyle choices, including: Daily care of your teeth and gums. Regular physical activity. Eating a healthy diet. Avoiding tobacco and drug use. Limiting alcohol use. Practicing safe sex. Taking low-dose aspirin every day. Taking vitamin and mineral supplements as recommended by your health care provider. What happens during an annual well check? The services and screenings done by your health care provider during your annual well check will depend on your age, overall health, lifestyle risk factors, and family history of disease. Counseling  Your health care provider may ask you questions about your: Alcohol use. Tobacco use. Drug use. Emotional well-being. Home and  relationship well-being. Sexual activity. Eating habits. History of falls. Memory and ability to understand (cognition). Work and work Statistician. Reproductive health. Screening  You may have the following tests or measurements: Height, weight, and BMI. Blood pressure. Lipid and cholesterol levels. These may be checked every 5 years, or more frequently if you are over 81 years old. Skin check. Lung cancer screening. You may have this screening every year starting at age 38 if you have a 30-pack-year history of smoking and currently smoke or have quit within the past 15 years. Fecal occult blood test (FOBT) of the stool. You may have this test every year starting at age 59. Flexible sigmoidoscopy or colonoscopy. You may have a sigmoidoscopy every 5 years or a colonoscopy every 10 years starting at age 41. Hepatitis C blood test. Hepatitis B blood test. Sexually transmitted disease (STD) testing. Diabetes screening. This is done by checking your blood sugar (glucose) after you have not eaten for a while (fasting). You may have this done every 1-3 years. Bone density scan. This is done to screen for osteoporosis. You may have this done starting at age 39. Mammogram. This may be done every 1-2 years. Talk to your health care provider about how often you should have regular mammograms. Talk with your health care provider about your test results, treatment options, and if necessary, the need for more tests. Vaccines  Your health care provider may recommend certain vaccines, such as: Influenza vaccine. This is recommended every year. Tetanus, diphtheria, and acellular pertussis (Tdap, Td) vaccine. You may need a Td booster every 10 years. Zoster vaccine. You may need this after age 12. Pneumococcal 13-valent conjugate (PCV13) vaccine. One dose is recommended after age 81. Pneumococcal polysaccharide (PPSV23) vaccine. One dose is recommended after age 78. Talk to your health  care provider  about which screenings and vaccines you need and how often you need them. This information is not intended to replace advice given to you by your health care provider. Make sure you discuss any questions you have with your health care provider. Document Released: 06/25/2015 Document Revised: 02/16/2016 Document Reviewed: 03/30/2015 Elsevier Interactive Patient Education  2017 [place] Prevention in the Home Falls can cause injuries. They can happen to people of all ages. There are many things you can do to make your home safe and to help prevent falls. What can I do on the outside of my home? Regularly fix the edges of walkways and driveways and fix any cracks. Remove anything that might make you trip as you walk through a door, such as a raised step or threshold. Trim any bushes or trees on the path to your home. Use bright outdoor lighting. Clear any walking paths of anything that might make someone trip, such as rocks or tools. Regularly check to see if handrails are loose or broken. Make sure that both sides of any steps have handrails. Any raised decks and porches should have guardrails on the edges. Have any leaves, snow, or ice cleared regularly. Use sand or salt on walking paths during winter. Clean up any spills in your garage right away. This includes oil or grease spills. What can I do in the bathroom? Use night lights. Install grab bars by the toilet and in the tub and shower. Do not use towel bars as grab bars. Use non-skid mats or decals in the tub or shower. If you need to sit down in the shower, use a plastic, non-slip stool. Keep the floor dry. Clean up any water that spills on the floor as soon as it happens. Remove soap buildup in the tub or shower regularly. Attach bath mats securely with double-sided non-slip rug tape. Do not have throw rugs and other things on the floor that can make you trip. What can I do in the bedroom? Use night lights. Make sure  that you have a light by your bed that is easy to reach. Do not use any sheets or blankets that are too big for your bed. They should not hang down onto the floor. Have a firm chair that has side arms. You can use this for support while you get dressed. Do not have throw rugs and other things on the floor that can make you trip. What can I do in the kitchen? Clean up any spills right away. Avoid walking on wet floors. Keep items that you use a lot in easy-to-reach places. If you need to reach something above you, use a strong step stool that has a grab bar. Keep electrical cords out of the way. Do not use floor polish or wax that makes floors slippery. If you must use wax, use non-skid floor wax. Do not have throw rugs and other things on the floor that can make you trip. What can I do with my stairs? Do not leave any items on the stairs. Make sure that there are handrails on both sides of the stairs and use them. Fix handrails that are broken or loose. Make sure that handrails are as long as the stairways. Check any carpeting to make sure that it is firmly attached to the stairs. Fix any carpet that is loose or worn. Avoid having throw rugs at the top or bottom of the stairs. If you do have throw rugs, attach them to  the floor with carpet tape. Make sure that you have a light switch at the top of the stairs and the bottom of the stairs. If you do not have them, ask someone to add them for you. What else can I do to help prevent falls? Wear shoes that: Do not have high heels. Have rubber bottoms. Are comfortable and fit you well. Are closed at the toe. Do not wear sandals. If you use a stepladder: Make sure that it is fully opened. Do not climb a closed stepladder. Make sure that both sides of the stepladder are locked into place. Ask someone to hold it for you, if possible. Clearly mark and make sure that you can see: Any grab bars or handrails. First and last steps. Where the edge of  each step is. Use tools that help you move around (mobility aids) if they are needed. These include: Canes. Walkers. Scooters. Crutches. Turn on the lights when you go into a dark area. Replace any light bulbs as soon as they burn out. Set up your furniture so you have a clear path. Avoid moving your furniture around. If any of your floors are uneven, fix them. If there are any pets around you, be aware of where they are. Review your medicines with your doctor. Some medicines can make you feel dizzy. This can increase your chance of falling. Ask your doctor what other things that you can do to help prevent falls. This information is not intended to replace advice given to you by your health care provider. Make sure you discuss any questions you have with your health care provider. Document Released: 03/25/2009 Document Revised: 11/04/2015 Document Reviewed: 07/03/2014 Elsevier Interactive Patient Education  2017 [employer].

## 2021-07-18 DIAGNOSIS — H401131 Primary open-angle glaucoma, bilateral, mild stage: Secondary | ICD-10-CM | POA: Diagnosis not present

## 2021-07-19 ENCOUNTER — Other Ambulatory Visit: Payer: Self-pay

## 2021-07-19 ENCOUNTER — Encounter: Payer: Self-pay | Admitting: Plastic Surgery

## 2021-07-19 ENCOUNTER — Ambulatory Visit (INDEPENDENT_AMBULATORY_CARE_PROVIDER_SITE_OTHER): Payer: Self-pay | Admitting: Plastic Surgery

## 2021-07-19 DIAGNOSIS — Z719 Counseling, unspecified: Secondary | ICD-10-CM

## 2021-07-19 NOTE — Progress Notes (Signed)
Botulinum Toxin and Filler Injection Procedure Note  Procedure: Cosmetic botulinum toxin and Filler administration  Pre-operative Diagnosis: Dynamic rhytides  Post-operative Diagnosis: Same  Complications:  None  Brief history: The patient desires botulinum toxin injection of her forehead. I discussed with the patient this proposed procedure of botulinum toxin injections, which is customized depending on the particular needs of the patient. It is performed on facial rhytids as a temporary correction. The alternatives were discussed with the patient. The risks were addressed including bleeding, scarring, infection, damage to deeper structures, asymmetry, and chronic pain, which may occur infrequently after a procedure. The individual's choice to undergo a surgical procedure is based on the comparison of risks to potential benefits. Other risks include unsatisfactory results, brow ptosis, eyelid ptosis, allergic reaction, temporary paralysis, which should go away with time, bruising, blurring disturbances and delayed healing. Botulinum toxin injections do not arrest the aging process or produce permanent tightening of the eyelid.  Operative intervention maybe necessary to maintain the results of a blepharoplasty or botulinum toxin. The patient understands and wishes to proceed.  Procedure: The area was prepped with alcohol and dried with a clean gauze. Using a clean technique, the botulinum toxin was diluted with 1.25 cc of preservative-free normal saline which was slowly injected with an 18 gauge needle in a tuberculin syringes.  A 32 gauge needles were then used to inject the botulinum toxin. This mixture allow for an aliquot of 4 units per 0.1 cc in each injection site.    Subsequently the mixture was injected in the glabellar and forehead area with preservation of the temporal branch to the lateral eyebrow as well as into each lateral canthal area beginning from the lateral orbital rim medial to the  zygomaticus major in 3 separate areas. A total of 36 Units of botulinum toxin was used. The forehead and glabellar area was injected with care to inject intramuscular only while holding pressure on the supratrochlear vessels in each area during each injection on either side of the medial corrugators. The injection proceeded vertically superiorly to the medial 2/3 of the frontalis muscle and superior 2/3 of the lateral frontalis, again with preservation of the frontal branch.  Filler was placed in the nasolabial folds and lateral oral area. No complications were noted. Light pressure was held for 5 minutes. She was instructed explicitly in post-operative care.  Botox LOT:  [id number] EXP:  2025/03  J. Ultra Plus XC LOT: [id number] EXP: 2021/09/25

## 2021-07-28 DIAGNOSIS — X32XXXA Exposure to sunlight, initial encounter: Secondary | ICD-10-CM | POA: Diagnosis not present

## 2021-07-28 DIAGNOSIS — L821 Other seborrheic keratosis: Secondary | ICD-10-CM | POA: Diagnosis not present

## 2021-07-28 DIAGNOSIS — L814 Other melanin hyperpigmentation: Secondary | ICD-10-CM | POA: Diagnosis not present

## 2021-08-15 ENCOUNTER — Other Ambulatory Visit: Payer: Self-pay | Admitting: Cardiovascular Disease

## 2021-10-05 DIAGNOSIS — H401131 Primary open-angle glaucoma, bilateral, mild stage: Secondary | ICD-10-CM | POA: Diagnosis not present

## 2021-10-11 ENCOUNTER — Encounter: Payer: Self-pay | Admitting: Physician Assistant

## 2021-10-11 ENCOUNTER — Ambulatory Visit (INDEPENDENT_AMBULATORY_CARE_PROVIDER_SITE_OTHER): Payer: PPO | Admitting: Physician Assistant

## 2021-10-11 VITALS — BP 117/76 | HR 80 | Temp 99.2°F | Resp 14 | Wt 139.0 lb

## 2021-10-11 DIAGNOSIS — J019 Acute sinusitis, unspecified: Secondary | ICD-10-CM

## 2021-10-11 MED ORDER — DOXYCYCLINE HYCLATE 100 MG PO TABS
100.0000 mg | ORAL_TABLET | Freq: Two times a day (BID) | ORAL | 0 refills | Status: DC
Start: 2021-10-11 — End: 2022-01-23

## 2021-10-11 NOTE — Progress Notes (Deleted)
?  ? ? ?  Established patient visit ? ? ?Patient: Isabel Gonzalez   DOB: [birth year]   76 y.o. Female  MRN: [id number] ?Visit Date: 10/11/2021 ? ?Today's healthcare provider: [name], [name]  ? ?No chief complaint on file. ? ?Subjective  ?  ?HPI  ?*** ? ?Medications: ?Outpatient Medications Prior to Visit  ?Medication Sig  ? Apoaequorin (PREVAGEN) 10 MG CAPS Take 10 mg by mouth daily.  ? aspirin 81 MG EC tablet Take 81 mg by mouth daily.  ? bimatoprost (LUMIGAN) 0.03 % ophthalmic solution   ? cetirizine (ZYRTEC) 10 MG tablet Take 10 mg by mouth daily.  ? Cholecalciferol (VITAMIN D) 50 MCG (2000 UT) tablet Take 2,000 Units by mouth every other day.  ? diltiazem (CARDIZEM CD) 120 MG 24 hr capsule Take 1 capsule (120 mg total) by mouth daily.  ? diltiazem (CARDIZEM) 30 MG tablet TAKE ONE TABLET 3 TIMES DAILY AS NEEDED  ? escitalopram (LEXAPRO) 10 MG tablet Take 1 tablet (10 mg total) by mouth at bedtime.  ? Esomeprazole Magnesium (NEXIUM 24HR PO) Take 1 tablet by mouth at bedtime.  ? ezetimibe (ZETIA) 10 MG tablet TAKE ONE TABLET BY MOUTH DAILY  ? fluticasone (FLONASE) 50 MCG/ACT nasal spray 2 SPRAYS INTO BOTH NOSTRILS EVERY DAY  ? FLUZONE HIGH-DOSE QUADRIVALENT 0.7 ML SUSY   ? Ginger, Zingiber officinalis, (GINGER ROOT PO) Take by mouth every other day. (Patient not taking: Reported on 07/06/2021)  ? latanoprost (XALATAN) 0.005 % ophthalmic solution Place 1 drop into both eyes at bedtime.  ? Magnesium 500 MG CAPS Take by mouth every other day.  ? Misc Natural Products (ELDERBERRY ZINC/VIT C/IMMUNE MT) Take 1 tablet by mouth daily at 6 (six) AM. With Vitamin D  ? PFIZER COVID-19 VAC BIVALENT injection   ? POTASSIUM PO Take by mouth daily.  ? pravastatin (PRAVACHOL) 20 MG tablet Take 1 tablet (20 mg total) by mouth every evening.  ? RESTASIS 0.05 % ophthalmic emulsion USE ONE DROP IN BOTH EYES TWICE DAILY  ? Specialty Vitamins Products (ONE-A-DAY BONE STRENGTH PO) Take by mouth daily.  ? triamcinolone cream  (KENALOG) 0.1 % Apply 1 application topically 2 (two) times daily.  ? vitamin B-12 (CYANOCOBALAMIN) 1000 MCG tablet Take 1 tablet (1,000 mcg total) by mouth daily.  ? vitamin C (ASCORBIC ACID) 500 MG tablet Take 500 mg by mouth daily.  ? XIIDRA 5 % SOLN Place 1 drop into both eyes 2 (two) times daily. Only using it once a day  ? ?No facility-administered medications prior to visit.  ? ? ?Review of Systems ? ?{Labs  Heme  Chem  Endocrine  Serology  Results Review (optional):23779} ?  Objective  ?  ?There were no vitals taken for this visit. ?{Show previous vital signs (optional):23777} ? ?Physical Exam  ?*** ? ?No results found for any visits on 10/11/21. ? Assessment & Plan  ?  ? ?*** ? ?No follow-ups on file.  ?   ? ?{provider attestation***:1} ? ? ?[name], [name]  ?[place] ?[phone] (phone) ?[phone] (fax) ? ?[facility]  ?

## 2021-10-11 NOTE — Progress Notes (Signed)
?  ? ?I,Roshena L Chambers,acting as a Education administrator for [employer], [name].,have documented all relevant documentation on the behalf of Isabel Speak, [name],as directed by  [employer], [name] while in the presence of [employer], [name].  ? ? ?Established patient visit ? ? ?Patient: Isabel Gonzalez   DOB: 01-Feb-1946   76 y.o. Female  MRN: [id number] ?Visit Date: 10/11/2021 ? ?Today's healthcare provider: [name], [name]  ? ?Chief Complaint  ?Patient presents with  ? Cough  ? ?Subjective  ?  ?Cough ?This is a new problem. Episode onset: 2 weeks ago. The problem has been gradually worsening. The cough is Productive of sputum (white sputum). Associated symptoms include a fever (this morning 101), postnasal drip and rhinorrhea. Pertinent negatives include no chest pain, chills, sore throat, shortness of breath or wheezing. Treatments tried: Mucinex and Sudafed. The treatment provided no relief.   ? ? ?Medications: ?Outpatient Medications Prior to Visit  ?Medication Sig  ? Apoaequorin (PREVAGEN) 10 MG CAPS Take 10 mg by mouth daily.  ? aspirin 81 MG EC tablet Take 81 mg by mouth daily.  ? bimatoprost (LUMIGAN) 0.03 % ophthalmic solution   ? cetirizine (ZYRTEC) 10 MG tablet Take 10 mg by mouth daily.  ? Cholecalciferol (VITAMIN D) 50 MCG (2000 UT) tablet Take 2,000 Units by mouth every other day.  ? diltiazem (CARDIZEM CD) 120 MG 24 hr capsule Take 1 capsule (120 mg total) by mouth daily.  ? diltiazem (CARDIZEM) 30 MG tablet TAKE ONE TABLET 3 TIMES DAILY AS NEEDED  ? escitalopram (LEXAPRO) 10 MG tablet Take 1 tablet (10 mg total) by mouth at bedtime.  ? Esomeprazole Magnesium (NEXIUM 24HR PO) Take 1 tablet by mouth at bedtime.  ? ezetimibe (ZETIA) 10 MG tablet TAKE ONE TABLET BY MOUTH DAILY  ? fluticasone (FLONASE) 50 MCG/ACT nasal spray 2 SPRAYS INTO BOTH NOSTRILS EVERY DAY  ? FLUZONE HIGH-DOSE QUADRIVALENT 0.7 ML SUSY   ? Ginger, Zingiber officinalis, (GINGER ROOT PO) Take by mouth every other day.  ? latanoprost  (XALATAN) 0.005 % ophthalmic solution Place 1 drop into both eyes at bedtime.  ? Magnesium 500 MG CAPS Take by mouth every other day.  ? Misc Natural Products (ELDERBERRY ZINC/VIT C/IMMUNE MT) Take 1 tablet by mouth daily at 6 (six) AM. With Vitamin D  ? PFIZER COVID-19 VAC BIVALENT injection   ? POTASSIUM PO Take by mouth daily.  ? pravastatin (PRAVACHOL) 20 MG tablet Take 1 tablet (20 mg total) by mouth every evening.  ? Specialty Vitamins Products (ONE-A-DAY BONE STRENGTH PO) Take by mouth daily.  ? triamcinolone cream (KENALOG) 0.1 % Apply 1 application topically 2 (two) times daily.  ? vitamin B-12 (CYANOCOBALAMIN) 1000 MCG tablet Take 1 tablet (1,000 mcg total) by mouth daily.  ? vitamin C (ASCORBIC ACID) 500 MG tablet Take 500 mg by mouth daily.  ? XIIDRA 5 % SOLN Place 1 drop into both eyes 2 (two) times daily. Only using it once a day  ? [DISCONTINUED] RESTASIS 0.05 % ophthalmic emulsion USE ONE DROP IN BOTH EYES TWICE DAILY (Patient not taking: Reported on 10/11/2021)  ? ?No facility-administered medications prior to visit.  ? ? ?Review of Systems  ?Constitutional:  Positive for fever (this morning 101). Negative for appetite change, chills and fatigue.  ?HENT:  Positive for congestion (chest and throat congestion), postnasal drip, rhinorrhea, sinus pressure, sinus pain and sneezing. Negative for sore throat.   ?Respiratory:  Positive for cough. Negative for chest tightness, shortness of  breath and wheezing.   ?Cardiovascular:  Negative for chest pain and palpitations.  ?Gastrointestinal:  Negative for abdominal pain, nausea and vomiting.  ?Neurological:  Negative for dizziness and weakness.  ? ? ?  Objective  ?  ?BP 117/76 (BP Location: Left Arm, Patient Position: Sitting, Cuff Size: Normal)   Pulse 80   Temp 99.2 ?F (37.3 ?C) (Oral)   Resp 14   Wt 139 lb (63 kg)   SpO2 100% Comment: room air  BMI 26.26 kg/m?  ? ? ?Physical Exam ?Vitals and nursing note reviewed.  ?Constitutional:   ?   General: She  is not in acute distress. ?   Appearance: Normal appearance.  ?HENT:  ?   Head: Normocephalic and atraumatic.  ?   Mouth/Throat:  ?   Mouth: Mucous membranes are moist.  ?   Pharynx: Posterior oropharyngeal erythema (mild) present.  ?Eyes:  ?   Extraocular Movements: Extraocular movements intact.  ?   Pupils: Pupils are equal, round, and reactive to light.  ?Cardiovascular:  ?   Rate and Rhythm: Normal rate and regular rhythm.  ?   Pulses: Normal pulses.  ?   Heart sounds: Normal heart sounds.  ?Pulmonary:  ?   Effort: Pulmonary effort is normal.  ?   Breath sounds: Normal breath sounds.  ?Neurological:  ?   Mental Status: She is alert and oriented to person, place, and time.  ?Psychiatric:     ?   Behavior: Behavior normal.     ?   Thought Content: Thought content normal.     ?   Judgment: Judgment normal.  ?  ? ? ?No results found for any visits on 10/11/21. ? Assessment & Plan  ?  ? ?1. Acute rhinosinusitis ?>10 days ?- Symptomatic treatment with OTC mucinex, nasal saline rinse, antihistamines, humidifier.  ?- doxycycline (VIBRA-TABS) 100 MG tablet; Take 1 tablet (100 mg total) by mouth 2 (two) times daily.  Dispense: 14 tablet; Refill: 0  ? ?Patient was instructed not to fill it unless ?their symptoms worsen  ?Discussed the potential negative side effects of antibiotics and that they can lead to ?resistance if used unnecessarily ?Discussed expectations for duration of symptoms.  Explained that you can get multiple viruses in a row and recommend ?prevention strategies such as hand washing ? ?FU in 7- 14 days. ?   ?The patient was advised to call back or seek an in-person evaluation if the symptoms worsen or if the condition fails to improve as anticipated. ? ?I discussed the assessment and treatment plan with the patient. The patient was provided an opportunity to ask questions and all were answered. The patient agreed with the plan and demonstrated an understanding of the instructions. ? ?The entirety of the  information documented in the History of Present Illness, Review of Systems and Physical Exam were personally obtained by me. Portions of this information were initially documented by the CMA and reviewed by me for thoroughness and accuracy.  ? ? Portions of this note were created using dictation software and may contain typographical errors.  ? ?Isabel Speak, [name]  ?[place] ?[phone] (phone) ?[phone] (fax) ? ?[facility]  ?

## 2021-10-12 ENCOUNTER — Encounter: Payer: Self-pay | Admitting: Physician Assistant

## 2021-10-19 ENCOUNTER — Ambulatory Visit (INDEPENDENT_AMBULATORY_CARE_PROVIDER_SITE_OTHER): Payer: PPO | Admitting: Physician Assistant

## 2021-10-19 ENCOUNTER — Encounter: Payer: Self-pay | Admitting: Physician Assistant

## 2021-10-19 VITALS — BP 148/61 | HR 66 | Temp 97.9°F | Resp 16 | Ht 60.0 in | Wt 140.0 lb

## 2021-10-19 DIAGNOSIS — J019 Acute sinusitis, unspecified: Secondary | ICD-10-CM | POA: Diagnosis not present

## 2021-10-19 NOTE — Progress Notes (Signed)
?  ? ? ?Established patient visit ? ? ?I,Tiffany J Bragg,acting as a Education administrator for [employer], [name].,have documented all relevant documentation on the behalf of [name], [name],as directed by  [employer], [name] while in the presence of [employer], [name].  ? ?Patient: Isabel Gonzalez   DOB: 12-06-1945   76 y.o. Female  MRN: [id number] ?Visit Date: 10/19/2021 ? ?Today's healthcare provider: [name], [name]  ? ?Chief Complaint  ?Patient presents with  ? Sinusitis  ? ?Subjective  ?  ?HPI  ?Follow up for acute rhinosinusitis ? ?The patient was last seen for this 7 days ago. ?Changes made at last visit include took an antibiotic. Patient states she finished the dose 2 days ago  ? ?She reports excellent compliance with treatment. ?She feels that condition is Improved, then came back today ?She is not having side effects.  ? ?-----------------------------------------------------------------------------------------  ? ?Medications: ?Outpatient Medications Prior to Visit  ?Medication Sig  ? Apoaequorin (PREVAGEN) 10 MG CAPS Take 10 mg by mouth daily.  ? aspirin 81 MG EC tablet Take 81 mg by mouth daily.  ? bimatoprost (LUMIGAN) 0.03 % ophthalmic solution   ? cetirizine (ZYRTEC) 10 MG tablet Take 10 mg by mouth daily.  ? Cholecalciferol (VITAMIN D) 50 MCG (2000 UT) tablet Take 2,000 Units by mouth every other day.  ? diltiazem (CARDIZEM CD) 120 MG 24 hr capsule Take 1 capsule (120 mg total) by mouth daily.  ? diltiazem (CARDIZEM) 30 MG tablet TAKE ONE TABLET 3 TIMES DAILY AS NEEDED  ? doxycycline (VIBRA-TABS) 100 MG tablet Take 1 tablet (100 mg total) by mouth 2 (two) times daily.  ? escitalopram (LEXAPRO) 10 MG tablet Take 1 tablet (10 mg total) by mouth at bedtime.  ? Esomeprazole Magnesium (NEXIUM 24HR PO) Take 1 tablet by mouth at bedtime.  ? ezetimibe (ZETIA) 10 MG tablet TAKE ONE TABLET BY MOUTH DAILY  ? fluticasone (FLONASE) 50 MCG/ACT nasal spray 2 SPRAYS INTO BOTH NOSTRILS EVERY DAY  ? FLUZONE  HIGH-DOSE QUADRIVALENT 0.7 ML SUSY   ? Ginger, Zingiber officinalis, (GINGER ROOT PO) Take by mouth every other day.  ? latanoprost (XALATAN) 0.005 % ophthalmic solution Place 1 drop into both eyes at bedtime.  ? Magnesium 500 MG CAPS Take by mouth every other day.  ? Misc Natural Products (ELDERBERRY ZINC/VIT C/IMMUNE MT) Take 1 tablet by mouth daily at 6 (six) AM. With Vitamin D  ? PFIZER COVID-19 VAC BIVALENT injection   ? POTASSIUM PO Take by mouth daily.  ? pravastatin (PRAVACHOL) 20 MG tablet Take 1 tablet (20 mg total) by mouth every evening.  ? Specialty Vitamins Products (ONE-A-DAY BONE STRENGTH PO) Take by mouth daily.  ? triamcinolone cream (KENALOG) 0.1 % Apply 1 application topically 2 (two) times daily.  ? vitamin B-12 (CYANOCOBALAMIN) 1000 MCG tablet Take 1 tablet (1,000 mcg total) by mouth daily.  ? vitamin C (ASCORBIC ACID) 500 MG tablet Take 500 mg by mouth daily.  ? XIIDRA 5 % SOLN Place 1 drop into both eyes 2 (two) times daily. Only using it once a day  ? ?No facility-administered medications prior to visit.  ? ? ?Review of Systems  ?Constitutional: Negative.   ?HENT:  Positive for congestion.   ?Eyes: Negative.   ?Respiratory: Negative.    ?Cardiovascular: Negative.   ? ? ?  Objective  ?  ?BP (!) 148/61 (BP Location: Left Arm, Patient Position: Sitting, Cuff Size: Normal)   Pulse 66   Temp 97.9 ?F (  36.6 ?C) (Oral)   Resp 16   Ht 5' (1.524 m)   Wt 140 lb (63.5 kg)   SpO2 99%   BMI 27.34 kg/m?  ? ? ?Physical Exam ?Vitals reviewed.  ?Constitutional:   ?   General: She is not in acute distress. ?   Appearance: Normal appearance.  ?HENT:  ?   Head: Normocephalic and atraumatic.  ?   Right Ear: Tympanic membrane, ear canal and external ear normal.  ?   Left Ear: Tympanic membrane, ear canal and external ear normal.  ?   Nose: Congestion present.  ?   Mouth/Throat:  ?   Mouth: Mucous membranes are moist.  ?   Pharynx: No oropharyngeal exudate or posterior oropharyngeal erythema.  ?Eyes:  ?    Extraocular Movements: Extraocular movements intact.  ?   Conjunctiva/sclera: Conjunctivae normal.  ?   Pupils: Pupils are equal, round, and reactive to light.  ?Cardiovascular:  ?   Rate and Rhythm: Normal rate and regular rhythm.  ?   Pulses: Normal pulses.  ?   Heart sounds: Normal heart sounds.  ?Pulmonary:  ?   Effort: Pulmonary effort is normal.  ?   Breath sounds: Normal breath sounds.  ?Neurological:  ?   Mental Status: She is alert.  ?  ? ?No results found for any visits on 10/19/21. ? Assessment & Plan  ?  ? ?1. Acute rhinosinusitis ?Improved. ? ?Lung exam normal ?Patient prefers not to take any medications ?Course of abx was completed last Friday. ?Recommended to continue with Flonase, Antihistamines and recheck her status on Monday. Continue air humidifier, lots of fluids. ? ?Fu in 2 weeks ? ? ?The patient was advised to call back or seek an in-person evaluation if the symptoms worsen or if the condition fails to improve as anticipated. ? ?I discussed the assessment and treatment plan with the patient. The patient was provided an opportunity to ask questions and all were answered. The patient agreed with the plan and demonstrated an understanding of the instructions. ? ?The entirety of the information documented in the History of Present Illness, Review of Systems and Physical Exam were personally obtained by me. Portions of this information were initially documented by the CMA and reviewed by me for thoroughness and accuracy.   ?Portions of this note were created using dictation software and may contain typographical errors.  ? ?[name], [name]  ?[place] ?[phone] (phone) ?[phone] (fax) ? ?[facility]  ?

## 2021-10-20 ENCOUNTER — Ambulatory Visit: Payer: PPO | Admitting: Physician Assistant

## 2021-10-21 DIAGNOSIS — J019 Acute sinusitis, unspecified: Secondary | ICD-10-CM | POA: Insufficient documentation

## 2021-11-10 ENCOUNTER — Other Ambulatory Visit: Payer: Self-pay | Admitting: Cardiovascular Disease

## 2021-11-15 ENCOUNTER — Other Ambulatory Visit: Payer: Self-pay | Admitting: Cardiovascular Disease

## 2021-11-15 ENCOUNTER — Other Ambulatory Visit: Payer: Self-pay | Admitting: Family Medicine

## 2021-11-15 DIAGNOSIS — F411 Generalized anxiety disorder: Secondary | ICD-10-CM

## 2021-11-15 DIAGNOSIS — I493 Ventricular premature depolarization: Secondary | ICD-10-CM

## 2021-11-15 DIAGNOSIS — I10 Essential (primary) hypertension: Secondary | ICD-10-CM

## 2021-12-09 DIAGNOSIS — B078 Other viral warts: Secondary | ICD-10-CM | POA: Diagnosis not present

## 2021-12-09 DIAGNOSIS — D2262 Melanocytic nevi of left upper limb, including shoulder: Secondary | ICD-10-CM | POA: Diagnosis not present

## 2021-12-09 DIAGNOSIS — R208 Other disturbances of skin sensation: Secondary | ICD-10-CM | POA: Diagnosis not present

## 2021-12-09 DIAGNOSIS — D2261 Melanocytic nevi of right upper limb, including shoulder: Secondary | ICD-10-CM | POA: Diagnosis not present

## 2021-12-09 DIAGNOSIS — Z85828 Personal history of other malignant neoplasm of skin: Secondary | ICD-10-CM | POA: Diagnosis not present

## 2021-12-09 DIAGNOSIS — L570 Actinic keratosis: Secondary | ICD-10-CM | POA: Diagnosis not present

## 2021-12-09 DIAGNOSIS — D225 Melanocytic nevi of trunk: Secondary | ICD-10-CM | POA: Diagnosis not present

## 2021-12-09 DIAGNOSIS — L538 Other specified erythematous conditions: Secondary | ICD-10-CM | POA: Diagnosis not present

## 2022-01-13 ENCOUNTER — Encounter: Payer: Self-pay | Admitting: Family Medicine

## 2022-01-23 ENCOUNTER — Encounter: Payer: Self-pay | Admitting: Physician Assistant

## 2022-01-23 ENCOUNTER — Ambulatory Visit (INDEPENDENT_AMBULATORY_CARE_PROVIDER_SITE_OTHER): Payer: PPO | Admitting: Physician Assistant

## 2022-01-23 VITALS — BP 164/65 | HR 72 | Temp 97.6°F | Resp 12

## 2022-01-23 DIAGNOSIS — H918X3 Other specified hearing loss, bilateral: Secondary | ICD-10-CM

## 2022-01-23 DIAGNOSIS — H9113 Presbycusis, bilateral: Secondary | ICD-10-CM

## 2022-01-23 DIAGNOSIS — R030 Elevated blood-pressure reading, without diagnosis of hypertension: Secondary | ICD-10-CM

## 2022-01-23 NOTE — Progress Notes (Unsigned)
I,[name],acting as a Education administrator for [employer], [name].,have documented all relevant documentation on the behalf of Isabel Speak, [name],as directed by  [employer], [name] while in the presence of [employer], [name].   Established patient visit   Patient: Isabel Gonzalez   DOB: Aug 07, 1945   76 y.o. Female  MRN: [id number] Visit Date: 01/23/2022  Today's healthcare provider: [name], [name]   Chief Complaint  Patient presents with   Ear Problem   Subjective    HPI  Itchy ears: Patient complains of itchiness in both ears and decreased hearing for more than 2 months. She was last seen in the office on 10/19/2021 and was told that she had a build up of ear wax. Patient would like to have ears irrigated today.   Medications: Outpatient Medications Prior to Visit  Medication Sig   Apoaequorin (PREVAGEN) 10 MG CAPS Take 10 mg by mouth daily.   aspirin 81 MG EC tablet Take 81 mg by mouth daily.   bimatoprost (LUMIGAN) 0.03 % ophthalmic solution    cetirizine (ZYRTEC) 10 MG tablet Take 10 mg by mouth daily.   Cholecalciferol (VITAMIN D) 50 MCG (2000 UT) tablet Take 2,000 Units by mouth every other day.   diltiazem (CARDIZEM CD) 120 MG 24 hr capsule TAKE 1 CAPSULE BY MOUTH DAILY.   diltiazem (CARDIZEM) 30 MG tablet TAKE ONE TABLET 3 TIMES DAILY AS NEEDED   escitalopram (LEXAPRO) 10 MG tablet TAKE 1 TABLET BY MOUTH AT BEDTIME   Esomeprazole Magnesium (NEXIUM 24HR PO) Take 1 tablet by mouth at bedtime.   ezetimibe (ZETIA) 10 MG tablet TAKE ONE TABLET BY MOUTH DAILY   fluticasone (FLONASE) 50 MCG/ACT nasal spray 2 SPRAYS INTO BOTH NOSTRILS EVERY DAY   Ginger, Zingiber officinalis, (GINGER ROOT PO) Take by mouth every other day.   latanoprost (XALATAN) 0.005 % ophthalmic solution Place 1 drop into both eyes at bedtime.   Magnesium 500 MG CAPS Take by mouth every other day.   Misc Natural Products (ELDERBERRY ZINC/VIT C/IMMUNE MT) Take 1 tablet by mouth daily at 6 (six) AM.  With Vitamin D   PFIZER COVID-19 VAC BIVALENT injection    POTASSIUM PO Take by mouth daily.   pravastatin (PRAVACHOL) 20 MG tablet TAKE 1 TABLET BY MOUTH EVERY EVENING.   Specialty Vitamins Products (ONE-A-DAY BONE STRENGTH PO) Take by mouth daily.   triamcinolone cream (KENALOG) 0.1 % Apply 1 application topically 2 (two) times daily.   vitamin B-12 (CYANOCOBALAMIN) 1000 MCG tablet Take 1 tablet (1,000 mcg total) by mouth daily.   vitamin C (ASCORBIC ACID) 500 MG tablet Take 500 mg by mouth daily.   XIIDRA 5 % SOLN Place 1 drop into both eyes 2 (two) times daily. Only using it once a day   [DISCONTINUED] doxycycline (VIBRA-TABS) 100 MG tablet Take 1 tablet (100 mg total) by mouth 2 (two) times daily. (Patient not taking: Reported on 01/23/2022)   [DISCONTINUED] FLUZONE HIGH-DOSE QUADRIVALENT 0.7 ML SUSY  (Patient not taking: Reported on 01/23/2022)   No facility-administered medications prior to visit.    Review of Systems  Constitutional:  Negative for appetite change, chills, fatigue and fever.  HENT:  Negative for ear discharge and ear pain.        Itchy ears and decreased  hearing  Eyes:  Negative for itching.  Respiratory:  Negative for chest tightness and shortness of breath.   Cardiovascular:  Negative for chest pain and palpitations.  Gastrointestinal:  Negative for  abdominal pain, nausea and vomiting.  Neurological:  Negative for dizziness and weakness.    {Labs  Heme  Chem  Endocrine  Serology  Results Review (optional):23779}   Objective    BP (!) 164/65 (BP Location: Left Arm, Patient Position: Sitting, Cuff Size: Normal)   Pulse 72   Temp 97.6 F (36.4 C) (Oral)   Resp 12   SpO2 99% Comment: room air {Show previous vital signs (optional):23777}  Today's Vitals   01/23/22 1507 01/23/22 1511  BP: (!) 152/70 (!) 164/65  Pulse: 72   Resp: 12   Temp: 97.6 F (36.4 C)   TempSrc: Oral   SpO2: 99%    There is no height or weight on file to calculate BMI.    Physical Exam Vitals reviewed.  Constitutional:      General: She is not in acute distress.    Appearance: Normal appearance. She is well-developed. She is not diaphoretic.  HENT:     Head: Normocephalic and atraumatic.  Eyes:     General: No scleral icterus.    Conjunctiva/sclera: Conjunctivae normal.  Neck:     Thyroid: No thyromegaly.  Cardiovascular:     Rate and Rhythm: Normal rate and regular rhythm.     Pulses: Normal pulses.     Heart sounds: Normal heart sounds. No murmur heard. Pulmonary:     Effort: Pulmonary effort is normal. No respiratory distress.     Breath sounds: Normal breath sounds. No wheezing, rhonchi or rales.  Musculoskeletal:     Cervical back: Neck supple.     Right lower leg: No edema.     Left lower leg: No edema.  Lymphadenopathy:     Cervical: No cervical adenopathy.  Skin:    General: Skin is warm and dry.     Findings: No rash.  Neurological:     Mental Status: She is alert and oriented to person, place, and time. Mental status is at baseline.  Psychiatric:        Mood and Affect: Mood normal.        Behavior: Behavior normal.      No results found for any visits on 01/23/22.  Assessment & Plan      Elevated BP without diagnosis of hypertension BP today was elevated but Pt reassured me that outside BP WNL. She cannot take BP meds 2/2 hypotension? Pt will be seeing Dr. Rockey Situ next week. Requested to measured her BP at home and bring her record to her next appt.   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  Dispense: 6 mL; Refill: 0 Continue to use Hydrogen peroxide 3%, carbamide peroxide or use debrox for wax removal   CPE and AWV in September?     The patient was advised to call back or seek an in-person  evaluation if the symptoms worsen or if the condition fails to improve as anticipated.  I discussed the assessment and treatment plan with the patient. The patient was provided an opportunity to ask questions and all were answered. The patient agreed with the plan and demonstrated an understanding of the instructions.  The entirety of the information documented in the History of Present Illness, Review of Systems and Physical Exam were personally obtained by me. Portions of this information were initially documented  by the [place] and reviewed by me for thoroughness and accuracy.  Portions of this note were created using dictation software and may contain typographical errors.     Isabel Speak, [name]  [facility] [phone] (phone) [phone] (fax)  [place]

## 2022-01-24 ENCOUNTER — Telehealth: Payer: Self-pay

## 2022-01-24 MED ORDER — AZELASTINE HCL 0.05 % OP SOLN: 1.0000 [drp] | Freq: Two times a day (BID) | OPHTHALMIC | 12 refills | Status: DC

## 2022-01-24 MED ORDER — AZELASTINE HCL 0.05 % OP SOLN: 1.0000 [drp] | Freq: Two times a day (BID) | OPHTHALMIC | 0 refills | Status: AC

## 2022-01-24 NOTE — Telephone Encounter (Signed)
Received call from Pharmacy.   They need clarification on Sig for Azelastine.  azelastine (OPTIVAR) 0.05 % ophthalmic solution [[id number]]    Order Details Dose: 1 drop Route: Both Eyes Frequency: 2 times daily  Dispense Quantity: 6 mL Refills: 0        Sig: Place 1 drop into both eyes 2 (two) times daily. Use as ear drops   Please review, and inform pharmacy of correct sig.  Call back to [facility]  [place] [phone]

## 2022-01-24 NOTE — Telephone Encounter (Signed)
Copied from [place] [id number]. Topic: General - Other >> Jan 24, 2022 12:09 PM Everette C wrote: Reason for CRM: Kennyth Lose would like to speak with Prov. Ostwalt when possible for rx clarity on the azelastine (OPTIVAR) 0.05 % ophthalmic solution [[id number]]  prescription  Please contact further when possible

## 2022-01-24 NOTE — Telephone Encounter (Signed)
Pharmacy advised.  Place 1 drop in both ears daily.  Long hold time so message left on VM.

## 2022-01-24 NOTE — Telephone Encounter (Signed)
Sig clarified with pharmacist.
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
## 2022-01-31 ENCOUNTER — Ambulatory Visit: Payer: PPO | Admitting: Cardiovascular Disease

## 2022-01-31 ENCOUNTER — Encounter: Payer: Self-pay | Admitting: Cardiovascular Disease

## 2022-01-31 VITALS — BP 100/60 | HR 68 | Ht 60.0 in | Wt 136.1 lb

## 2022-01-31 DIAGNOSIS — I10 Essential (primary) hypertension: Secondary | ICD-10-CM

## 2022-01-31 DIAGNOSIS — I700 Atherosclerosis of aorta: Secondary | ICD-10-CM

## 2022-01-31 DIAGNOSIS — E785 Hyperlipidemia, unspecified: Secondary | ICD-10-CM

## 2022-01-31 DIAGNOSIS — R002 Palpitations: Secondary | ICD-10-CM

## 2022-01-31 DIAGNOSIS — I493 Ventricular premature depolarization: Secondary | ICD-10-CM

## 2022-01-31 DIAGNOSIS — R079 Chest pain, unspecified: Secondary | ICD-10-CM

## 2022-01-31 MED ORDER — EZETIMIBE 10 MG PO TABS: 10.0000 mg | ORAL_TABLET | Freq: Every day | ORAL | 3 refills | Status: DC

## 2022-01-31 MED ORDER — DILTIAZEM HCL ER COATED BEADS 120 MG PO CP24: 120.0000 mg | ORAL_CAPSULE | Freq: Every day | ORAL | 3 refills | Status: DC

## 2022-01-31 MED ORDER — DILTIAZEM HCL 30 MG PO TABS: ORAL_TABLET | ORAL | 3 refills | Status: DC

## 2022-01-31 MED ORDER — PRAVASTATIN SODIUM 20 MG PO TABS: 20.0000 mg | ORAL_TABLET | Freq: Every evening | ORAL | 3 refills | Status: DC

## 2022-01-31 NOTE — Patient Instructions (Addendum)
Medication Instructions:  No changes  If you need a refill on your cardiac medications before your next appointment, please call your pharmacy.   Lab work: No new labs needed  Testing/Procedures: No new testing needed  Follow-Up: At [facility], you and your health needs are our priority.  As part of our continuing mission to provide you with exceptional heart care, we have created designated Provider Care Teams.  These Care Teams include your primary Cardiologist (physician) and Advanced Practice Providers (APPs -  Physician Assistants and Nurse Practitioners) who all work together to provide you with the care you need, when you need it.  You will need a follow up appointment in 12 months  Providers on your designated Care Team:   Christopher Berge, NP Ryan Dunn, [name] Cadence Furth, [name]  COVID-19 Vaccine Information can be found at: [email or web address] For questions related to vaccine distribution or appointments, please email [email or web address] or call [phone].   

## 2022-02-10 ENCOUNTER — Other Ambulatory Visit: Payer: Self-pay | Admitting: Cardiovascular Disease

## 2022-02-22 DIAGNOSIS — H903 Sensorineural hearing loss, bilateral: Secondary | ICD-10-CM | POA: Diagnosis not present

## 2022-02-22 DIAGNOSIS — H6063 Unspecified chronic otitis externa, bilateral: Secondary | ICD-10-CM | POA: Diagnosis not present

## 2022-03-29 DIAGNOSIS — H401131 Primary open-angle glaucoma, bilateral, mild stage: Secondary | ICD-10-CM | POA: Diagnosis not present

## 2022-04-26 DIAGNOSIS — H401131 Primary open-angle glaucoma, bilateral, mild stage: Secondary | ICD-10-CM | POA: Diagnosis not present

## 2022-06-14 DIAGNOSIS — H401131 Primary open-angle glaucoma, bilateral, mild stage: Secondary | ICD-10-CM | POA: Diagnosis not present

## 2022-07-05 DIAGNOSIS — L218 Other seborrheic dermatitis: Secondary | ICD-10-CM | POA: Diagnosis not present

## 2022-07-26 NOTE — Progress Notes (Unsigned)
I,Sandra Tellefsen S Parley Pidcock,acting as a Education administrator for Lavon Paganini, MD.,have documented all relevant documentation on the behalf of Lavon Paganini, MD,as directed by  Lavon Paganini, MD while in the presence of Lavon Paganini, MD.    Annual Wellness Visit     Patient: Isabel Gonzalez, Female    DOB: 29-Sep-1945, 77 y.o.   MRN: [id number] Visit Date: 07/27/2022  Today's Provider: Lavon Paganini, MD   No chief complaint on file.  Subjective    Rya Bisaillon is a 77 y.o. female who presents today for her Annual Wellness Visit. She reports consuming a {diet types:17450} diet. {Exercise:19826} She generally feels {well/fairly well/poorly:18703}. She reports sleeping {well/fairly well/poorly:18703}. She {does/does not:200015} have additional problems to discuss today.   HPI  Medications: Outpatient Medications Prior to Visit  Medication Sig   Apoaequorin (PREVAGEN) 10 MG CAPS Take 10 mg by mouth daily.   aspirin 81 MG EC tablet Take 81 mg by mouth daily.   azelastine (OPTIVAR) 0.05 % ophthalmic solution Place 1 drop into both eyes 2 (two) times daily. Use as ear drops (Patient not taking: Reported on 01/31/2022)   bimatoprost (LUMIGAN) 0.03 % ophthalmic solution    cetirizine (ZYRTEC) 10 MG tablet Take 10 mg by mouth daily.   Cholecalciferol (VITAMIN D) 50 MCG (2000 UT) tablet Take 2,000 Units by mouth every other day.   diltiazem (CARDIZEM CD) 120 MG 24 hr capsule Take 1 capsule (120 mg total) by mouth daily.   diltiazem (CARDIZEM) 30 MG tablet TAKE ONE TABLET 3 TIMES DAILY AS NEEDED   escitalopram (LEXAPRO) 10 MG tablet TAKE 1 TABLET BY MOUTH AT BEDTIME   Esomeprazole Magnesium (NEXIUM 24HR PO) Take 1 tablet by mouth at bedtime.   ezetimibe (ZETIA) 10 MG tablet Take 1 tablet (10 mg total) by mouth daily.   fluticasone (FLONASE) 50 MCG/ACT nasal spray 2 SPRAYS INTO BOTH NOSTRILS EVERY DAY   Ginger, Zingiber officinalis, (GINGER ROOT PO) Take by mouth every other  day.   latanoprost (XALATAN) 0.005 % ophthalmic solution Place 1 drop into both eyes at bedtime.   Magnesium 500 MG CAPS Take by mouth every other day.   Misc Natural Products (ELDERBERRY ZINC/VIT C/IMMUNE MT) Take 1 tablet by mouth daily at 6 (six) AM. With Vitamin D   PFIZER COVID-19 VAC BIVALENT injection    POTASSIUM PO Take by mouth daily.   pravastatin (PRAVACHOL) 20 MG tablet Take 1 tablet (20 mg total) by mouth every evening.   Specialty Vitamins Products (ONE-A-DAY BONE STRENGTH PO) Take by mouth daily.   triamcinolone cream (KENALOG) 0.1 % Apply 1 application topically 2 (two) times daily.   vitamin B-12 (CYANOCOBALAMIN) 1000 MCG tablet Take 1 tablet (1,000 mcg total) by mouth daily.   vitamin C (ASCORBIC ACID) 500 MG tablet Take 500 mg by mouth daily.   XIIDRA 5 % SOLN Place 1 drop into both eyes 2 (two) times daily. Only using it once a day   No facility-administered medications prior to visit.    Allergies  Allergen Reactions   Percocet [Oxycodone-Acetaminophen] Nausea And Vomiting   Vicodin [Hydrocodone-Acetaminophen] Nausea And Vomiting    Patient Care Team: Virginia Crews, MD as PCP - General (Family Medicine) Rockey Situ, Kathlene November, MD as PCP - Cardiology (Cardiology) Minna Merritts, MD as Consulting Physician (Cardiology) Oneta Rack, MD as Consulting Physician (Dermatology) Cleaster Corin, OD (Optometry)  Review of Systems  {Labs  Heme  Chem  Endocrine  Serology  Results Review (optional):23779}  Objective    Vitals: There were no vitals taken for this visit. {Show previous vital signs (optional):23777}   Physical Exam ***  Most recent functional status assessment:     No data to display         Most recent fall risk assessment:    07/06/2021    3:24 PM  [place] in the past year? 1  Number falls in past yr: 0  Injury with Fall? 0  Risk for fall due to : History of fall(s)  Follow up Falls prevention discussed     Most recent depression screenings:    07/06/2021    3:19 PM 01/11/2021    1:11 PM  PHQ 2/9 Scores  PHQ - 2 Score 0 0  PHQ- 9 Score  0   Most recent cognitive screening:     No data to display         Most recent Audit-C alcohol use screening    07/06/2021    3:17 PM  Alcohol Use Disorder Test (AUDIT)  1. How often do you have a drink containing alcohol? 4  2. How many drinks containing alcohol do you have on a typical day when you are drinking? 0  3. How often do you have six or more drinks on one occasion? 0  AUDIT-C Score 4  4. How often during the last year have you found that you were not able to stop drinking once you had started? 0  5. How often during the last year have you failed to do what was normally expected from you because of drinking? 0  6. How often during the last year have you needed a first drink in the morning to get yourself going after a heavy drinking session? 0  7. How often during the last year have you had a feeling of guilt of remorse after drinking? 0  8. How often during the last year have you been unable to remember what happened the night before because you had been drinking? 0  9. Have you or someone else been injured as a result of your drinking? 0  10. Has a relative or friend or a doctor or another health worker been concerned about your drinking or suggested you cut down? 0  Alcohol Use Disorder Identification Test Final Score (AUDIT) 4   A score of 3 or more in women, and 4 or more in men indicates increased risk for alcohol abuse, EXCEPT if all of the points are from question 1   No results found for any visits on 07/27/22.  Assessment & Plan     Annual wellness visit done today including the all of the following: Reviewed patient's Family Medical History Reviewed and updated list of patient's medical providers Assessment of cognitive impairment was done Assessed patient's functional ability Established a written schedule for health  screening [place] Completed and Reviewed  Exercise Activities and Dietary recommendations  Goals      DIET - EAT MORE FRUITS AND VEGETABLES     DIET - INCREASE WATER INTAKE     Recommend to drink at least 6-8 8oz glasses of water per day.        Immunization History  Administered Date(s) Administered   Fluad Quad(high Dose 65+) 03/08/2021   Influenza Split 03/29/2009   Influenza, High Dose Seasonal PF 03/24/2020, 03/21/2022   Influenza-Unspecified 03/20/2018, 02/12/2019, 03/08/2021   PFIZER(Purple Top)SARS-COV-2 Vaccination 06/18/2019, 07/09/2019, 04/12/2020   Pfizer Covid-19 Office manager  32yr & up 04/03/2021   Pneumococcal Conjugate-13 12/10/2013   Pneumococcal Polysaccharide-23 06/02/2010   Respiratory Syncytial Virus Vaccine,Recomb Aduvanted(Arexvy) 03/03/2022   Td 06/29/2003   Zoster Recombinat (Shingrix) 09/07/2017, 01/22/2018   Zoster, Live 04/18/2006    Health Maintenance  Topic Date Due   DTaP/Tdap/Td (2 - Tdap) 06/28/2013   COVID-19 Vaccine (5 - 2023-24 season) 02/10/2022   Medicare Annual Wellness (AWV)  07/06/2022   DEXA SCAN  03/15/2023   Pneumonia Vaccine 77 Years old  Completed   INFLUENZA VACCINE  Completed   Hepatitis C Screening  Completed   Zoster Vaccines- Shingrix  Completed   HPV VACCINES  Aged Out   COLONOSCOPY (Pts 45-434yrInsurance coverage will need to be confirmed)  Discontinued     Discussed health benefits of physical activity, and encouraged her to engage in regular exercise appropriate for her age and condition.    ***  No follow-ups on file.     {provider attestation***:1}   AnLavon PaganiniMD  Co[phone]) [phone])  [place]

## 2022-07-27 ENCOUNTER — Ambulatory Visit (INDEPENDENT_AMBULATORY_CARE_PROVIDER_SITE_OTHER): Payer: HMO | Admitting: Family Medicine

## 2022-07-27 ENCOUNTER — Encounter: Payer: Self-pay | Admitting: Family Medicine

## 2022-07-27 VITALS — BP 159/63 | HR 61 | Temp 99.5°F | Resp 16 | Ht 60.0 in | Wt 139.5 lb

## 2022-07-27 DIAGNOSIS — T466X5A Adverse effect of antihyperlipidemic and antiarteriosclerotic drugs, initial encounter: Secondary | ICD-10-CM

## 2022-07-27 DIAGNOSIS — I10 Essential (primary) hypertension: Secondary | ICD-10-CM | POA: Diagnosis not present

## 2022-07-27 DIAGNOSIS — R7303 Prediabetes: Secondary | ICD-10-CM

## 2022-07-27 DIAGNOSIS — Z780 Asymptomatic menopausal state: Secondary | ICD-10-CM

## 2022-07-27 DIAGNOSIS — E7800 Pure hypercholesterolemia, unspecified: Secondary | ICD-10-CM | POA: Diagnosis not present

## 2022-07-27 DIAGNOSIS — Z0000 Encounter for general adult medical examination without abnormal findings: Secondary | ICD-10-CM

## 2022-07-27 MED ORDER — LOSARTAN POTASSIUM 25 MG PO TABS: 25.0000 mg | ORAL_TABLET | Freq: Every day | ORAL | 1 refills | Status: DC

## 2022-07-27 NOTE — Assessment & Plan Note (Signed)
New diagnosis Start losartan 25 mg daily Continue diltiazem for PVCs, but HR will not allow further titration for BP control at this time Check metabolic panel Monitor home BPs

## 2022-07-27 NOTE — Assessment & Plan Note (Addendum)
Previously well controlled Continue zetia H/o myalgias - no longer on pravastatin Repeat FLP and CMP

## 2022-07-27 NOTE — Assessment & Plan Note (Signed)
Recommend low carb diet °Recheck A1c  °

## 2022-07-28 ENCOUNTER — Encounter: Payer: Self-pay | Admitting: Family Medicine

## 2022-07-28 LAB — COMPREHENSIVE METABOLIC PANEL
ALT: 24 IU/L (ref 0–32)
AST: 21 IU/L (ref 0–40)
Albumin/Globulin Ratio: 2 (ref 1.2–2.2)
Albumin: 4.5 g/dL (ref 3.8–4.8)
Alkaline Phosphatase: 77 IU/L (ref 44–121)
BUN/Creatinine Ratio: 27 (ref 12–28)
BUN: 18 mg/dL (ref 8–27)
Bilirubin Total: 0.5 mg/dL (ref 0.0–1.2)
CO2: 24 mmol/L (ref 20–29)
Calcium: 10.3 mg/dL (ref 8.7–10.3)
Chloride: 102 mmol/L (ref 96–106)
Creatinine, Ser: 0.66 mg/dL (ref 0.57–1.00)
Globulin, Total: 2.3 g/dL (ref 1.5–4.5)
Glucose: 102 mg/dL — ABNORMAL HIGH (ref 70–99)
Potassium: 4.6 mmol/L (ref 3.5–5.2)
Sodium: 143 mmol/L (ref 134–144)
Total Protein: 6.8 g/dL (ref 6.0–8.5)
eGFR: 90 mL/min/{1.73_m2} (ref >59)

## 2022-07-28 LAB — LIPID PANEL
Chol/HDL Ratio: 3.7 ratio (ref 0.0–4.4)
Cholesterol, Total: 224 mg/dL — ABNORMAL HIGH (ref 100–199)
HDL: 61 mg/dL (ref >39)
LDL Chol Calc (NIH): 128 mg/dL — ABNORMAL HIGH (ref 0–99)
Triglycerides: 199 mg/dL — ABNORMAL HIGH (ref 0–149)
VLDL Cholesterol Cal: 35 mg/dL (ref 5–40)

## 2022-07-28 LAB — HEMOGLOBIN A1C
Est. average glucose Bld gHb Est-mCnc: 120 mg/dL
Hgb A1c MFr Bld: 5.8 % — ABNORMAL HIGH (ref 4.8–5.6)

## 2022-08-24 ENCOUNTER — Encounter: Payer: Self-pay | Admitting: Family Medicine

## 2022-08-24 ENCOUNTER — Ambulatory Visit (INDEPENDENT_AMBULATORY_CARE_PROVIDER_SITE_OTHER): Payer: HMO | Admitting: Family Medicine

## 2022-08-24 VITALS — BP 138/66 | HR 64 | Temp 98.4°F | Resp 12 | Wt 138.0 lb

## 2022-08-24 DIAGNOSIS — I10 Essential (primary) hypertension: Secondary | ICD-10-CM | POA: Diagnosis not present

## 2022-08-24 DIAGNOSIS — F411 Generalized anxiety disorder: Secondary | ICD-10-CM

## 2022-08-24 MED ORDER — ALPRAZOLAM 0.5 MG PO TABS: 0.2500 mg | ORAL_TABLET | Freq: Every evening | ORAL | 1 refills | Status: DC | PRN

## 2022-08-24 NOTE — Assessment & Plan Note (Signed)
Chronic and well controlled Continue lexapro Ok to use low dose xanax sparingly - have discussed risks

## 2022-08-24 NOTE — Progress Notes (Signed)
I,Sulibeya S Dimas,acting as a Education administrator for Lavon Paganini, MD.,have documented all relevant documentation on the behalf of Lavon Paganini, MD,as directed by  Lavon Paganini, MD while in the presence of Lavon Paganini, MD.   Established patient visit   Patient: Isabel Gonzalez   DOB: [birth year]   77 y.o. Female  MRN: [id number] Visit Date: 08/24/2022  Today's healthcare provider: Lavon Paganini, MD   Chief Complaint  Patient presents with   Hypertension   Subjective    HPI   Follow up for hypertension  The patient was last seen for this 1 months ago. Changes made at last visit include start losartan 25 mg daily. Patient reports she has been taking xanax 1/2 tablet last few nights. She reports xanax has helped with BP readings.   She reports excellent compliance with treatment. She feels that condition is Improved. She is not having side effects.   BP Readings from Last 3 Encounters:  08/24/22 138/66  07/27/22 (!) 159/63  01/31/22 100/60   -----------------------------------------------------------------------------------------  Medications: Outpatient Medications Prior to Visit  Medication Sig   Apoaequorin (PREVAGEN) 10 MG CAPS Take 10 mg by mouth daily.   aspirin 81 MG EC tablet Take 81 mg by mouth daily.   azelastine (OPTIVAR) 0.05 % ophthalmic solution Place 1 drop into both eyes 2 (two) times daily. Use as ear drops   bimatoprost (LUMIGAN) 0.03 % ophthalmic solution    Cholecalciferol (VITAMIN D) 50 MCG (2000 UT) tablet Take 2,000 Units by mouth every other day.   diltiazem (CARDIZEM CD) 120 MG 24 hr capsule Take 1 capsule (120 mg total) by mouth daily.   diltiazem (CARDIZEM) 30 MG tablet TAKE ONE TABLET 3 TIMES DAILY AS NEEDED   escitalopram (LEXAPRO) 10 MG tablet TAKE 1 TABLET BY MOUTH AT BEDTIME   ezetimibe (ZETIA) 10 MG tablet Take 1 tablet (10 mg total) by mouth daily.   latanoprost (XALATAN) 0.005 % ophthalmic solution Place 1 drop  into both eyes at bedtime.   losartan (COZAAR) 25 MG tablet Take 1 tablet (25 mg total) by mouth daily.   Magnesium 500 MG CAPS Take by mouth every other day.   Misc Natural Products (ELDERBERRY ZINC/VIT C/IMMUNE MT) Take 1 tablet by mouth daily at 6 (six) AM. With Vitamin D   triamcinolone cream (KENALOG) 0.1 % Apply 1 application topically 2 (two) times daily.   vitamin B-12 (CYANOCOBALAMIN) 1000 MCG tablet Take 1 tablet (1,000 mcg total) by mouth daily.   vitamin C (ASCORBIC ACID) 500 MG tablet Take 500 mg by mouth daily.   [DISCONTINUED] POTASSIUM PO Take by mouth daily. (Patient not taking: Reported on 08/24/2022)   [DISCONTINUED] Specialty Vitamins Products (ONE-A-DAY BONE STRENGTH PO) Take by mouth daily. (Patient not taking: Reported on 08/24/2022)   [DISCONTINUED] XIIDRA 5 % SOLN Place 1 drop into both eyes 2 (two) times daily. Only using it once a day (Patient not taking: Reported on 08/24/2022)   No facility-administered medications prior to visit.    Review of Systems per HPI     Objective    BP 138/66 (BP Location: Left Arm, Patient Position: Sitting, Cuff Size: Normal)   Pulse 64   Temp 98.4 F (36.9 C) (Temporal)   Resp 12   Wt 138 lb (62.6 kg)   SpO2 98%   BMI 26.95 kg/m    Physical Exam Vitals reviewed.  Constitutional:      General: She is not in acute distress.    Appearance: Normal  appearance. She is well-developed. She is not diaphoretic.  HENT:     Head: Normocephalic and atraumatic.  Eyes:     General: No scleral icterus.    Conjunctiva/sclera: Conjunctivae normal.  Neck:     Thyroid: No thyromegaly.  Cardiovascular:     Rate and Rhythm: Normal rate and regular rhythm.     Pulses: Normal pulses.     Heart sounds: Normal heart sounds. No murmur heard. Pulmonary:     Effort: Pulmonary effort is normal. No respiratory distress.     Breath sounds: Normal breath sounds. No wheezing, rhonchi or rales.  Musculoskeletal:     Cervical back: Neck supple.      Right lower leg: No edema.     Left lower leg: No edema.  Lymphadenopathy:     Cervical: No cervical adenopathy.  Skin:    General: Skin is warm and dry.     Findings: No rash.  Neurological:     Mental Status: She is alert and oriented to person, place, and time. Mental status is at baseline.  Psychiatric:        Mood and Affect: Mood normal.        Behavior: Behavior normal.       No results found for any visits on 08/24/22.  Assessment & Plan     Problem List Items Addressed This Visit       Cardiovascular and Mediastinum   Primary hypertension - Primary    Well controlled Continue current medications Recheck metabolic panel F/u in 3 months       Relevant Orders   Basic Metabolic Panel (BMET)     Other   GAD (generalized anxiety disorder)    Chronic and well controlled Continue lexapro Ok to use low dose xanax sparingly - have discussed risks      Relevant Medications   ALPRAZolam (XANAX) 0.5 MG tablet     Return in about 3 months (around 11/24/2022) for chronic disease f/u.      I, Lavon Paganini, MD, have reviewed all documentation for this visit. The documentation on 08/24/22 for the exam, diagnosis, procedures, and orders are all accurate and complete.   Hewitt Garner, Dionne Bucy, MD, MPH [facility]

## 2022-08-24 NOTE — Assessment & Plan Note (Signed)
Well controlled Continue current medications Recheck metabolic panel F/u in 3 months  

## 2022-08-25 LAB — BASIC METABOLIC PANEL
BUN/Creatinine Ratio: 33 — ABNORMAL HIGH (ref 12–28)
BUN: 20 mg/dL (ref 8–27)
CO2: 21 mmol/L (ref 20–29)
Calcium: 9.9 mg/dL (ref 8.7–10.3)
Chloride: 103 mmol/L (ref 96–106)
Creatinine, Ser: 0.6 mg/dL (ref 0.57–1.00)
Glucose: 90 mg/dL (ref 70–99)
Potassium: 4.5 mmol/L (ref 3.5–5.2)
Sodium: 141 mmol/L (ref 134–144)
eGFR: 92 mL/min/{1.73_m2} (ref >59)

## 2022-09-28 ENCOUNTER — Ambulatory Visit: Payer: HMO | Admitting: Family Medicine

## 2022-09-29 DIAGNOSIS — M5481 Occipital neuralgia: Secondary | ICD-10-CM | POA: Diagnosis not present

## 2022-09-29 DIAGNOSIS — Z79899 Other long term (current) drug therapy: Secondary | ICD-10-CM | POA: Diagnosis not present

## 2022-09-29 DIAGNOSIS — G3184 Mild cognitive impairment, so stated: Secondary | ICD-10-CM | POA: Diagnosis not present

## 2022-09-29 DIAGNOSIS — R200 Anesthesia of skin: Secondary | ICD-10-CM | POA: Diagnosis not present

## 2022-10-18 ENCOUNTER — Other Ambulatory Visit: Payer: Self-pay | Admitting: Student

## 2022-10-18 DIAGNOSIS — R200 Anesthesia of skin: Secondary | ICD-10-CM

## 2022-10-19 ENCOUNTER — Telehealth: Payer: Self-pay

## 2022-10-19 NOTE — Telephone Encounter (Signed)
Introductory phone call made to patient prior to her new patient appointment with Dr. Cathie Hoops on 10/19/21. Patient did not know where [facility] is so I gave her directions. Patient thank me for calling and said she looks forward to meeting [place] tomorrow.

## 2022-10-20 ENCOUNTER — Encounter: Payer: Self-pay | Admitting: Oncology

## 2022-10-20 ENCOUNTER — Inpatient Hospital Stay: Payer: HMO

## 2022-10-20 ENCOUNTER — Inpatient Hospital Stay: Payer: HMO | Attending: Oncology | Admitting: Oncology

## 2022-10-20 VITALS — BP 155/77 | HR 67 | Temp 96.2°F | Resp 18 | Wt 134.6 lb

## 2022-10-20 DIAGNOSIS — D472 Monoclonal gammopathy: Secondary | ICD-10-CM

## 2022-10-20 DIAGNOSIS — Z87891 Personal history of nicotine dependence: Secondary | ICD-10-CM | POA: Insufficient documentation

## 2022-10-20 LAB — CBC WITH DIFFERENTIAL/PLATELET
Abs Immature Granulocytes: 0.01 10*3/uL (ref 0.00–0.07)
Basophils Absolute: 0 10*3/uL (ref 0.0–0.1)
Basophils Relative: 0 %
Eosinophils Absolute: 0.1 10*3/uL (ref 0.0–0.5)
Eosinophils Relative: 1 %
HCT: 42.2 % (ref 36.0–46.0)
Hemoglobin: 13.8 g/dL (ref 12.0–15.0)
Immature Granulocytes: 0 %
Lymphocytes Relative: 16 %
Lymphs Abs: 1.1 10*3/uL (ref 0.7–4.0)
MCH: 31.7 pg (ref 26.0–34.0)
MCHC: 32.7 g/dL (ref 30.0–36.0)
MCV: 96.8 fL (ref 80.0–100.0)
Monocytes Absolute: 0.4 10*3/uL (ref 0.1–1.0)
Monocytes Relative: 6 %
Neutro Abs: 5.1 10*3/uL (ref 1.7–7.7)
Neutrophils Relative %: 77 %
Platelets: 321 10*3/uL (ref 150–400)
RBC: 4.36 MIL/uL (ref 3.87–5.11)
RDW: 12.2 % (ref 11.5–15.5)
WBC: 6.7 10*3/uL (ref 4.0–10.5)
nRBC: 0 % (ref 0.0–0.2)

## 2022-10-20 LAB — COMPREHENSIVE METABOLIC PANEL
ALT: 26 U/L (ref 0–44)
AST: 25 U/L (ref 15–41)
Albumin: 4.3 g/dL (ref 3.5–5.0)
Alkaline Phosphatase: 67 U/L (ref 38–126)
Anion gap: 11 (ref 5–15)
BUN: 16 mg/dL (ref 8–23)
CO2: 24 mmol/L (ref 22–32)
Calcium: 9.6 mg/dL (ref 8.9–10.3)
Chloride: 102 mmol/L (ref 98–111)
Creatinine, Ser: 0.59 mg/dL (ref 0.44–1.00)
GFR, Estimated: >60 mL/min (ref >60)
Glucose, Bld: 99 mg/dL (ref 70–99)
Potassium: 4.1 mmol/L (ref 3.5–5.1)
Sodium: 137 mmol/L (ref 135–145)
Total Bilirubin: 1.2 mg/dL (ref 0.3–1.2)
Total Protein: 7.4 g/dL (ref 6.5–8.1)

## 2022-10-20 NOTE — Assessment & Plan Note (Signed)
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 Less frequently, these patients progress to AL amyloidosis, light chain deposition disease, or another lymphoproliferative disorder. For now I recommend observation. Check SPEP and light chain ratio every 6 months.   

## 2022-10-20 NOTE — Progress Notes (Signed)
Hematology/Oncology Consult note [phone] [phone]   REFERRING PROVIDER: Janice Coffin, [name]  CHIEF COMPLAINTS/REASON FOR VISIT:  Evaluation of abnormal SPEP  ASSESSMENT & PLAN:   MGUS (monoclonal gammopathy of unknown significance) I discussed with patient about the diagnosis of IgG MGUS which is an asymptomatic condition which has a small risk of progression to smoldering multiple myeloma and to symptomatic multiple myeloma. Less frequently, these patients progress to AL amyloidosis, light chain deposition disease, or another lymphoproliferative disorder.  For now I recommend observation. Check SPEP and light chain ratio every 6 months.     Orders Placed This Encounter  Procedures   CBC with Differential/Platelet    Standing Status:   Future    Number of Occurrences:   1    Standing Expiration Date:   10/20/2023   Comprehensive metabolic panel    Standing Status:   Future    Number of Occurrences:   1    Standing Expiration Date:   10/20/2023   Multiple Myeloma Panel (SPEP&IFE w/QIG)    Standing Status:   Future    Number of Occurrences:   1    Standing Expiration Date:   10/20/2023   Kappa/lambda light chains    Standing Status:   Future    Number of Occurrences:   1    Standing Expiration Date:   10/20/2023   Beta 2 microglobulin, serum    Standing Status:   Future    Number of Occurrences:   1    Standing Expiration Date:   10/20/2023   Miscellaneous [facility] test (send-out)    Standing Status:   Future    Number of Occurrences:   1    Standing Expiration Date:   10/20/2023    Order Specific Question:   Test name / description:    Answer:   NT-proBNP [facility] [id number]   CBC with Differential (Cancer Center Only)    Standing Status:   Future    Standing Expiration Date:   10/20/2023   CMP (Cancer Center only)    Standing Status:   Future    Standing Expiration Date:   10/20/2023   Kappa/lambda light chains    Standing Status:   Future     Standing Expiration Date:   10/20/2023   Multiple Myeloma Panel (SPEP&IFE w/QIG)    Standing Status:   Future    Standing Expiration Date:   10/20/2023   Follow up in 6 months.  All questions were answered. The patient knows to call the clinic with any problems, questions or concerns.  Rickard Patience, MD, PhD [facility] Hematology Oncology 10/20/2022    HISTORY OF PRESENTING ILLNESS:  Isabel Gonzalez is a 77 y.o. female who was seen in consultation at the request of Janice Coffin, [name] for evaluation of abnormal SPEP results.  Patient is accompanied by her son today. Patient recently had work up done at [facility] office. Labs reviewed,  SPEP showed M spike 0.2,   IgG lamda  She denies history of abnormal bone pain or bone fracture. Patient denies history of recurrent infection or atypical infections such as shingles of meningitis. Denies chills, night sweats, anorexia or abnormal weight loss.  She follows up with neurology for transient facial paresthesia, transient focal neurologic symptoms of left hand and bilateral lower face, manage impairment and mild cognitive impairment.    MEDICAL HISTORY:  Past Medical History:  Diagnosis Date   Anemia    Anxiety    Arthritis  Cancer (HCC)    squamous cell   Constipation    Dysrhythmia    palpitations    Environmental allergies    GERD (gastroesophageal reflux disease)    History of bladder infections    Hyperlipidemia    Murmur    Urinary problem     SURGICAL HISTORY: Past Surgical History:  Procedure Laterality Date   BREAST REDUCTION SURGERY     FACIAL COSMETIC SURGERY     LAPAROTOMY N/A 02/24/2017   Procedure: EXPLORATORY LAPAROTOMY and repair small hernia;  Surgeon: Leafy Ro, MD;  Location: [facility] ORS;  Service: General;  Laterality: N/A;   ORIF HUMERUS FRACTURE Right 07/08/2015   Procedure: OPEN REDUCTION INTERNAL FIXATION (ORIF) HUMERAL SHAFT FRACTURE with bone graft and allograft;  Surgeon: Francena Hanly,  MD;  Location: [facility] OR;  Service: Orthopedics;  Laterality: Right;   SKIN SURGERY     squamous cell    SOCIAL HISTORY: Social History   Socioeconomic History   Marital status: Widowed    Spouse name: Not on file   Number of children: 3   Years of education: Not on file   Highest education level: Some college, no degree  Occupational History   Occupation: retired  Tobacco Use   Smoking status: Former    Packs/day: 0.25    Years: 25.00    Additional pack years: 0.00    Total pack years: 6.25    Types: Cigarettes    Quit date: 06/11/1990    Years since quitting: 32.3   Smokeless tobacco: Never  Vaping Use   Vaping Use: Never used  Substance and Sexual Activity   Alcohol use: Yes    Alcohol/week: 7.0 - 11.0 standard drinks of alcohol    Types: 7 - 11 Glasses of wine per week    Comment: 1-1.5 daily   Drug use: No   Sexual activity: Not on file  Other Topics Concern   Not on file  Social History Narrative   Regular exercise         Social Determinants of Health   Financial Resource Strain: Low Risk  (07/06/2021)   Overall Financial Resource Strain (CARDIA)    Difficulty of Paying Living Expenses: Not hard at all  Food Insecurity: No Food Insecurity (07/06/2021)   Hunger Vital Sign    Worried About Running Out of Food in the Last Year: Never true    Ran Out of Food in the Last Year: Never true  Transportation Needs: No Transportation Needs (07/06/2021)   PRAPARE - Administrator, Civil Service (Medical): No    Lack of Transportation (Non-Medical): No  Physical Activity: Sufficiently Active (07/06/2021)   Exercise Vital Sign    Days of Exercise per Week: 4 days    Minutes of Exercise per Session: 60 min  Stress: No Stress Concern Present (07/06/2021)   [employer] of Occupational Health - Occupational Stress Questionnaire    Feeling of Stress : Not at all  Social Connections: Moderately Integrated (07/06/2021)   Social Connection and Isolation Panel  [NHANES]    Frequency of Communication with Friends and Family: More than three times a week    Frequency of Social Gatherings with Friends and Family: More than three times a week    Attends Religious Services: More than 4 times per year    Active Member of [employer] or Organizations: Yes    Attends Banker Meetings: More than 4 times per year    Marital Status: Widowed  Intimate Partner Violence: Not At Risk (07/06/2021)   Humiliation, Afraid, Rape, and Kick questionnaire    Fear of Current or Ex-Partner: No    Emotionally Abused: No    Physically Abused: No    Sexually Abused: No    FAMILY HISTORY: Family History  Problem Relation Age of Onset   Stroke Mother 53   Diabetes Brother    Stroke Brother     ALLERGIES:  is allergic to percocet [oxycodone-acetaminophen] and vicodin [hydrocodone-acetaminophen].  MEDICATIONS:  Current Outpatient Medications  Medication Sig Dispense Refill   ALPRAZolam (XANAX) 0.5 MG tablet Take 0.5-1 tablets (0.25-0.5 mg total) by mouth at bedtime as needed for anxiety. 15 tablet 1   Apoaequorin (PREVAGEN) 10 MG CAPS Take 10 mg by mouth daily.     aspirin 81 MG EC tablet Take 81 mg by mouth daily.     azelastine (OPTIVAR) 0.05 % ophthalmic solution Place 1 drop into both eyes 2 (two) times daily. Use as ear drops 6 mL 0   bimatoprost (LUMIGAN) 0.03 % ophthalmic solution      Cholecalciferol (VITAMIN D) 50 MCG (2000 UT) tablet Take 2,000 Units by mouth every other day.     diltiazem (CARDIZEM CD) 120 MG 24 hr capsule Take 1 capsule (120 mg total) by mouth daily. 90 capsule 3   diltiazem (CARDIZEM) 30 MG tablet TAKE ONE TABLET 3 TIMES DAILY AS NEEDED 90 tablet 3   escitalopram (LEXAPRO) 10 MG tablet TAKE 1 TABLET BY MOUTH AT BEDTIME 90 tablet 3   ezetimibe (ZETIA) 10 MG tablet Take 1 tablet (10 mg total) by mouth daily. 90 tablet 3   latanoprost (XALATAN) 0.005 % ophthalmic solution Place 1 drop into both eyes at bedtime.     losartan (COZAAR)  25 MG tablet Take 1 tablet (25 mg total) by mouth daily. 90 tablet 1   Magnesium 500 MG CAPS Take by mouth every other day.     Misc Natural Products (ELDERBERRY ZINC/VIT C/IMMUNE MT) Take 1 tablet by mouth daily at 6 (six) AM. With Vitamin D     QUEtiapine (SEROQUEL) 25 MG tablet Take by mouth.     triamcinolone cream (KENALOG) 0.1 % Apply 1 application topically 2 (two) times daily. 30 g 0   vitamin B-12 (CYANOCOBALAMIN) 1000 MCG tablet Take 1 tablet (1,000 mcg total) by mouth daily. 30 tablet 0   vitamin C (ASCORBIC ACID) 500 MG tablet Take 500 mg by mouth daily.     No current facility-administered medications for this visit.    Review of Systems  Constitutional:  Negative for appetite change, chills, fatigue and fever.  HENT:   Negative for hearing loss and voice change.   Eyes:  Negative for eye problems.  Respiratory:  Negative for chest tightness and cough.   Cardiovascular:  Negative for chest pain.  Gastrointestinal:  Negative for abdominal distention, abdominal pain and blood in stool.  Endocrine: Negative for hot flashes.  Genitourinary:  Negative for difficulty urinating and frequency.   Musculoskeletal:  Negative for arthralgias.  Skin:  Negative for itching and rash.  Neurological:  Negative for extremity weakness.  Hematological:  Negative for adenopathy.  Psychiatric/Behavioral:  Negative for confusion.        Memory loss   PHYSICAL EXAMINATION: ECOG PERFORMANCE STATUS: 0 - Asymptomatic Vitals:   10/20/22 0921  BP: (!) 155/77  Pulse: 67  Resp: 18  Temp: (!) 96.2 F (35.7 C)  SpO2: 100%   Filed Weights   10/20/22 2952  Weight: 134 lb 9.6 oz (61.1 kg)    Physical Exam Constitutional:      General: She is not in acute distress. HENT:     Head: Normocephalic and atraumatic.  Eyes:     General: No scleral icterus. Cardiovascular:     Rate and Rhythm: Normal rate and regular rhythm.     Heart sounds: Normal heart sounds.  Pulmonary:     Effort:  Pulmonary effort is normal. No respiratory distress.     Breath sounds: No wheezing.  Abdominal:     General: Bowel sounds are normal. There is no distension.     Palpations: Abdomen is soft.  Musculoskeletal:        General: No deformity. Normal range of motion.     Cervical back: Normal range of motion and neck supple.  Skin:    General: Skin is warm and dry.     Findings: No erythema or rash.  Neurological:     Mental Status: She is alert and oriented to person, place, and time. Mental status is at baseline.     Cranial Nerves: No cranial nerve deficit.  Psychiatric:        Mood and Affect: Mood normal.      LABORATORY DATA:  I have reviewed the data as listed    Latest Ref Rng & Units 10/20/2022   10:22 AM 11/04/2020   12:00 AM 03/18/2019    9:46 AM  CBC  WBC 4.0 - 10.5 K/uL 6.7  6.4     8.1   Hemoglobin 12.0 - 15.0 g/dL 16.1  09.6     04.5   Hematocrit 36.0 - 46.0 % 42.2  43     42.6   Platelets 150 - 400 K/uL 321  319     290      This result is from an external source.      Latest Ref Rng & Units 10/20/2022   10:22 AM 08/24/2022    1:34 PM 07/27/2022    2:09 PM  CMP  Glucose 70 - 99 mg/dL 99  90  409   BUN 8 - 23 mg/dL 16  20  18    Creatinine 0.44 - 1.00 mg/dL 8.11  9.14  7.82   Sodium 135 - 145 mmol/L 137  141  143   Potassium 3.5 - 5.1 mmol/L 4.1  4.5  4.6   Chloride 98 - 111 mmol/L 102  103  102   CO2 22 - 32 mmol/L 24  21  24    Calcium 8.9 - 10.3 mg/dL 9.6  9.9  95.6   Total Protein 6.5 - 8.1 g/dL 7.4   6.8   Total Bilirubin 0.3 - 1.2 mg/dL 1.2   0.5   Alkaline Phos 38 - 126 U/L 67   77   AST 15 - 41 U/L 25   21   ALT 0 - 44 U/L 26   24             RADIOGRAPHIC STUDIES: I have personally reviewed the radiological images as listed and agreed with the findings in the report.  No results found.

## 2022-10-21 LAB — MISC LABCORP TEST (SEND OUT): Labcorp test code: 143000

## 2022-10-21 LAB — BETA 2 MICROGLOBULIN, SERUM: Beta-2 Microglobulin: 1.3 mg/L (ref 0.6–2.4)

## 2022-10-23 ENCOUNTER — Ambulatory Visit
Admission: RE | Admit: 2022-10-23 | Discharge: 2022-10-23 | Disposition: A | Discharge status: Home / Self Care | Payer: HMO | Source: Ambulatory Visit | Attending: Student | Admitting: Student

## 2022-10-23 DIAGNOSIS — R200 Anesthesia of skin: Secondary | ICD-10-CM | POA: Diagnosis not present

## 2022-10-23 DIAGNOSIS — R202 Paresthesia of skin: Secondary | ICD-10-CM | POA: Diagnosis not present

## 2022-10-23 DIAGNOSIS — R413 Other amnesia: Secondary | ICD-10-CM | POA: Diagnosis not present

## 2022-10-23 LAB — KAPPA/LAMBDA LIGHT CHAINS
Kappa free light chain: 14.5 mg/L (ref 3.3–19.4)
Kappa, lambda light chain ratio: 1.09 (ref 0.26–1.65)
Lambda free light chains: 13.3 mg/L (ref 5.7–26.3)

## 2022-10-25 DIAGNOSIS — H401131 Primary open-angle glaucoma, bilateral, mild stage: Secondary | ICD-10-CM | POA: Diagnosis not present

## 2022-10-30 ENCOUNTER — Telehealth: Payer: Self-pay | Admitting: Cardiovascular Disease

## 2022-10-30 LAB — MULTIPLE MYELOMA PANEL, SERUM
Albumin/Glob SerPl: 1.4 (ref 0.7–1.7)
B-Globulin SerPl Elph-Mcnc: 1 g/dL (ref 0.7–1.3)
Gamma Glob SerPl Elph-Mcnc: 0.8 g/dL (ref 0.4–1.8)
IgM (Immunoglobulin M), Srm: 32 mg/dL (ref 26–217)

## 2022-10-30 NOTE — Telephone Encounter (Signed)
Called and spoke with patient. We discussed her request and advised that we no longer perform nurse visits. Scheduled her to come in and see provider for further assessment. She was appreciative for the call with no further questions at this time.

## 2022-10-30 NOTE — Telephone Encounter (Signed)
Patient is calling requesting a callback to setup a nurse visit to come in for an EKG. She reports Dr. Clelia Croft is requesting she have it. Please advise.

## 2022-10-31 LAB — MULTIPLE MYELOMA PANEL, SERUM
Albumin SerPl Elph-Mcnc: 3.9 g/dL (ref 2.9–4.4)
Alpha 1: 0.2 g/dL (ref 0.0–0.4)
Alpha2 Glob SerPl Elph-Mcnc: 0.7 g/dL (ref 0.4–1.0)
Globulin, Total: 2.8 g/dL (ref 2.2–3.9)
IgA: 163 mg/dL (ref 64–422)
IgG (Immunoglobin G), Serum: 874 mg/dL (ref 586–1602)
M Protein SerPl Elph-Mcnc: 0.2 g/dL — ABNORMAL HIGH
Total Protein ELP: 6.7 g/dL (ref 6.0–8.5)

## 2022-11-02 DIAGNOSIS — R569 Unspecified convulsions: Secondary | ICD-10-CM | POA: Diagnosis not present

## 2022-11-03 ENCOUNTER — Other Ambulatory Visit: Payer: Self-pay | Admitting: Family Medicine

## 2022-11-03 NOTE — Telephone Encounter (Signed)
Rx was sent to pharmacy 07/27/22 #90/1 RF  Requested Prescriptions  Pending Prescriptions Disp Refills   losartan (COZAAR) 25 MG tablet [Pharmacy Med Name: LOSARTAN POTASSIUM 25 MG TAB] 90 tablet 1    Sig: TAKE ONE TABLET BY MOUTH DAILY.     Cardiovascular:  Angiotensin Receptor Blockers Failed - 11/03/2022  5:31 PM      Failed - Last BP in normal range    BP Readings from Last 1 Encounters:  10/20/22 (!) 155/77         Passed - Cr in normal range and within 180 days    Creatinine, Ser  Date Value Ref Range Status  10/20/2022 0.59 0.44 - 1.00 mg/dL Final         Passed - K in normal range and within 180 days    Potassium  Date Value Ref Range Status  10/20/2022 4.1 3.5 - 5.1 mmol/L Final         Passed - Patient is not pregnant      Passed - Valid encounter within last 6 months    Recent Outpatient Visits           2 months ago Primary hypertension   [facility] [place], Marzella Schlein, MD   3 months ago Encounter for annual wellness visit (AWV) in Medicare patient   [facility] [place], Marzella Schlein, MD   9 months ago Elevated BP without diagnosis of hypertension   [facility] [place], [place], [name]   1 year ago Acute rhinosinusitis   [facility] [place], [place], [name]   1 year ago Acute rhinosinusitis   [facility] [place], [place], [name]       Future Appointments             In 1 week Charlsie Quest, NP [facility] [place]   In 1 month Bacigalupo, Marzella Schlein, MD [facility]

## 2022-11-13 ENCOUNTER — Ambulatory Visit: Payer: HMO | Attending: Cardiology | Admitting: Cardiology

## 2022-11-13 ENCOUNTER — Encounter: Payer: Self-pay | Admitting: Cardiology

## 2022-11-13 VITALS — BP 138/70 | HR 68 | Ht 62.0 in | Wt 136.0 lb

## 2022-11-13 DIAGNOSIS — R002 Palpitations: Secondary | ICD-10-CM | POA: Diagnosis not present

## 2022-11-13 DIAGNOSIS — I700 Atherosclerosis of aorta: Secondary | ICD-10-CM

## 2022-11-13 DIAGNOSIS — E785 Hyperlipidemia, unspecified: Secondary | ICD-10-CM

## 2022-11-13 DIAGNOSIS — I10 Essential (primary) hypertension: Secondary | ICD-10-CM | POA: Diagnosis not present

## 2022-11-13 DIAGNOSIS — Z8673 Personal history of transient ischemic attack (TIA), and cerebral infarction without residual deficits: Secondary | ICD-10-CM | POA: Diagnosis not present

## 2022-11-13 DIAGNOSIS — I493 Ventricular premature depolarization: Secondary | ICD-10-CM

## 2022-11-13 NOTE — Patient Instructions (Signed)
Medication Instructions:  No changes at this time.   *If you need a refill on your cardiac medications before your next appointment, please call your pharmacy*   Lab Work: Lipid panel. This is fasting labs so nothing to eat or drink after midnight before except sip of water with your medications. No appointment is needed. Go to the [facility] entrance and check in at registration desk.   If you have labs (blood work) drawn today and your tests are completely normal, you will receive your results only by: MyChart Message (if you have MyChart) OR A paper copy in the mail If you have any lab test that is abnormal or we need to change your treatment, we will call you to review the results.   Testing/Procedures: None   Follow-Up: At [facility], you and your health needs are our priority.  As part of our continuing mission to provide you with exceptional heart care, we have created designated Provider Care Teams.  These Care Teams include your primary Cardiologist (physician) and Advanced Practice Providers (APPs -  Physician Assistants and Nurse Practitioners) who all work together to provide you with the care you need, when you need it.   Your next appointment:   1 year(s)  Provider:   Julien Nordmann, MD or Charlsie Quest, NP

## 2022-11-13 NOTE — Progress Notes (Signed)
Cardiology Office Note:   Date:  11/13/2022  ID:  Hailee Keitt, DOB October 07, 1945, MRN [id number] PCP: Erasmo Downer, MD  [facility] Providers Cardiologist:  Julien Nordmann, MD    History of Present Illness:   Isabel Gonzalez is a 77 y.o. female with a past medical history of hyperlipidemia, anxiety, panic attacks, remote smoking stopped 25 years ago, tachypalpitations, PVCs, mild to moderate aortic atherosclerosis seen on CT scan, statin myalgia, peripheral arterial disease, tachycardia, who presents today for follow-up.  Patient was last seen in clinic 01/31/2022 by Dr. Mariah Milling.  At that time she was doing fairly well.  Blood pressure had been above for couple of days due to some increased stress but it settled back down.  She was tolerating her diltiazem with no episodes of near syncope or orthostasis.  There were no medication changes that were made and no further testing that was ordered.  She had called the office on 10/30/2022 stating that Dr. Sherryll Burger (her neurologist) had requested she have an EKG be completed.  She returns to clinic today stating that she has been doing well.  She was unable to recall why she was advised she needed a follow-up EKG that was done.  She did note that she had seen neurology due to facial numbness which has resolved.  Prior to she believes that the numbness was related to her blood pressure being elevated.  We did discuss her lipid panel being elevated and if she was continuing to take her ezetimibe which she states she is.  She denies any hospitalizations or visits to the emergency department today.  She is very talkative today stating that she just returned from the beach.  ROS: 10 point review of systems has been reviewed and considered negative with exception of what is listed in the HPI.  Studies Reviewed:    EKG: Sinus rhythm with a rate of 60, left axis deviation, no acute change from prior studies.   Risk  Assessment/Calculations:              Physical Exam:   VS:  BP 138/70 (BP Location: Left Arm, Patient Position: Sitting, Cuff Size: Normal)   Pulse 68   Ht 5\' 2"  (1.575 m)   Wt 136 lb (61.7 kg)   SpO2 98%   BMI 24.87 kg/m    Wt Readings from Last 3 Encounters:  11/13/22 136 lb (61.7 kg)  10/20/22 134 lb 9.6 oz (61.1 kg)  08/24/22 138 lb (62.6 kg)     GEN: Well nourished, well developed in no acute distress NECK: No JVD; No carotid bruits CARDIAC: RRR, no murmurs, rubs, gallops RESPIRATORY:  Clear to auscultation without rales, wheezing or rhonchi  ABDOMEN: Soft, non-tender, non-distended EXTREMITIES:  No edema; No deformity   ASSESSMENT AND PLAN:   Primary hypertension with blood pressure 138/70.  Blood pressures remained stable on diltiazem 120 mg daily, as needed diltiazem 30 mg, and losartan 25 mg.  Encouraged to continue to monitor blood pressures at home once deemed post medication administration.  Hypercholesterolemia patient has been continued on Zetia 10 mg daily.  Started having myalgias and had to come off of pravastatin.  LDL of 128 in February goal less than 70.  Patient was uncertain if she continues to take Zetia or not she has been rescheduled for repeat lipid panel.  With statin myalgias she may do better taking bempedoic acid or an injectable such as Repatha.  History of tachypalpitations and recurrent PVCs  that have been managed on diltiazem 120 mg daily and as needed diltiazem 30 mg up to 3 times a day as needed.  She has not had to use any of the as needed dosing.   Mild to moderate aortic atherosclerosis noted on CT scan.  She is continued on aspirin 81 mg daily and ezetimibe 10 mg daily.  Repeating lipid panel today to determine if she needs further escalation of therapies.  History of TIA continues to follow-up with neurology.  Continue on aspirin and Zetia.  States she is having problems with memory at this time.  Recently had an MRI  completed.  Disposition patient return to this MD/APP in 1 year or sooner if needed.  She will need EKG on return.  Will also advise on further treatments after lab results are back.        Signed, Caulder Wehner, NP

## 2022-12-04 ENCOUNTER — Encounter: Payer: Self-pay | Admitting: Family Medicine

## 2022-12-04 ENCOUNTER — Ambulatory Visit (INDEPENDENT_AMBULATORY_CARE_PROVIDER_SITE_OTHER): Payer: HMO | Admitting: Family Medicine

## 2022-12-04 VITALS — BP 132/70 | HR 72 | Wt 134.6 lb

## 2022-12-04 DIAGNOSIS — F411 Generalized anxiety disorder: Secondary | ICD-10-CM

## 2022-12-04 DIAGNOSIS — I10 Essential (primary) hypertension: Secondary | ICD-10-CM

## 2022-12-04 DIAGNOSIS — F0390 Unspecified dementia without behavioral disturbance: Secondary | ICD-10-CM | POA: Diagnosis not present

## 2022-12-04 DIAGNOSIS — R7303 Prediabetes: Secondary | ICD-10-CM | POA: Diagnosis not present

## 2022-12-04 DIAGNOSIS — D472 Monoclonal gammopathy: Secondary | ICD-10-CM

## 2022-12-04 DIAGNOSIS — E7800 Pure hypercholesterolemia, unspecified: Secondary | ICD-10-CM | POA: Diagnosis not present

## 2022-12-04 MED ORDER — ESCITALOPRAM OXALATE 10 MG PO TABS
10.0000 mg | ORAL_TABLET | Freq: Every day | ORAL | 3 refills | Status: DC
Start: 2022-12-04 — End: 2023-09-27

## 2022-12-04 MED ORDER — LOSARTAN POTASSIUM 25 MG PO TABS: 25.0000 mg | ORAL_TABLET | Freq: Every day | ORAL | 1 refills | Status: DC

## 2022-12-04 NOTE — Assessment & Plan Note (Signed)
Recommend low carb diet Recheck A1c at next visit 

## 2022-12-04 NOTE — Assessment & Plan Note (Signed)
Chronic and well controlled Continue lexapro Ok to use low dose xanax sparingly - have discussed risks 

## 2022-12-04 NOTE — Assessment & Plan Note (Signed)
Discussed the nature of MGUS as a pre-multiple myeloma condition with a small risk of progression. Patient is aware and not anxious about it. -Monitor with SPEP and light chain tests per Onc every six months.

## 2022-12-04 NOTE — Progress Notes (Addendum)
I,Sha'taria Tyson,acting as a Neurosurgeon for Shirlee Latch, MD.,have documented all relevant documentation on the behalf of Shirlee Latch, MD,as directed by  Shirlee Latch, MD while in the presence of Shirlee Latch, MD.   Established patient visit   Patient: Isabel Gonzalez   DOB: November 10, 1945   77 y.o. Female  MRN: [id number] Visit Date: 12/04/2022  Today's healthcare provider: Shirlee Latch, MD   Chief Complaint  Patient presents with   Follow-up   Subjective    HPI  Hypertension, follow-up  BP Readings from Last 3 Encounters:  12/04/22 132/70  11/13/22 138/70  10/20/22 (!) 155/77   Wt Readings from Last 3 Encounters:  12/04/22 134 lb 9.6 oz (61.1 kg)  11/13/22 136 lb (61.7 kg)  10/20/22 134 lb 9.6 oz (61.1 kg)     She was last seen for hypertension 3 months ago.  Outside blood pressures are not being checked.  Symptoms: No chest pain No chest pressure  Yes palpitations No syncope  No dyspnea No orthopnea  No paroxysmal nocturnal dyspnea No lower extremity edema   Pertinent labs Lab Results  Component Value Date   CHOL 224 (H) 07/27/2022   HDL 61 07/27/2022   LDLCALC 128 (H) 07/27/2022   TRIG 199 (H) 07/27/2022   CHOLHDL 3.7 07/27/2022   Lab Results  Component Value Date   NA 137 10/20/2022   K 4.1 10/20/2022   CREATININE 0.59 10/20/2022   GFRNONAA >60 10/20/2022   GLUCOSE 99 10/20/2022   TSH 1.63 11/04/2020     The 10-year ASCVD risk score (Arnett DK, et al., 2019) is: 38.9% --------------------------------------------------------------------------------------------------- Discussed the use of AI scribe software for clinical note transcription with the patient, who gave verbal consent to proceed.  History of Present Illness   The patient, a 77 year old with a history of hypertension, recently diagnosed with potential dementia, and MGUS, presents for a follow-up visit. She reports a recent increase in anxiety, which led to a  consultation with Dr. Sherryll Burger and a subsequent diagnosis of dementia. She has been experiencing delusions, which have improved with daily medication (Seroquel), and she is now eating and feeling happier. However, she did experience initial dizziness with the new medication, which has since resolved.  The patient has also undergone extensive testing with various specialists. Cardiology evaluation was unremarkable. Oncology evaluation revealed MGUS, but the patient is not ready to pursue further intervention at this time. She has accepted the diagnosis and is not overly concerned about it.  The patient also reports that her blood pressure has been well-controlled on her current regimen of diltiazem and losartan. She is also taking Zetia for cholesterol management, which has been effective.       Medications: Outpatient Medications Prior to Visit  Medication Sig   ALPRAZolam (XANAX) 0.5 MG tablet Take 0.5-1 tablets (0.25-0.5 mg total) by mouth at bedtime as needed for anxiety.   Apoaequorin (PREVAGEN) 10 MG CAPS Take 10 mg by mouth daily.   aspirin 81 MG EC tablet Take 81 mg by mouth daily.   azelastine (OPTIVAR) 0.05 % ophthalmic solution Place 1 drop into both eyes 2 (two) times daily. Use as ear drops   bimatoprost (LUMIGAN) 0.03 % ophthalmic solution    Cholecalciferol (VITAMIN D) 50 MCG (2000 UT) tablet Take 2,000 Units by mouth every other day.   diltiazem (CARDIZEM CD) 120 MG 24 hr capsule Take 1 capsule (120 mg total) by mouth daily.   diltiazem (CARDIZEM) 30 MG tablet TAKE ONE TABLET  3 TIMES DAILY AS NEEDED   ezetimibe (ZETIA) 10 MG tablet Take 1 tablet (10 mg total) by mouth daily.   latanoprost (XALATAN) 0.005 % ophthalmic solution Place 1 drop into both eyes at bedtime.   Misc Natural Products (ELDERBERRY ZINC/VIT C/IMMUNE MT) Take 1 tablet by mouth daily at 6 (six) AM. With Vitamin D   QUEtiapine (SEROQUEL) 25 MG tablet Take by mouth.   triamcinolone cream (KENALOG) 0.1 % Apply 1  application topically 2 (two) times daily.   vitamin B-12 (CYANOCOBALAMIN) 1000 MCG tablet Take 1 tablet (1,000 mcg total) by mouth daily.   vitamin C (ASCORBIC ACID) 500 MG tablet Take 500 mg by mouth daily.   [DISCONTINUED] escitalopram (LEXAPRO) 10 MG tablet TAKE 1 TABLET BY MOUTH AT BEDTIME   [DISCONTINUED] losartan (COZAAR) 25 MG tablet Take 1 tablet (25 mg total) by mouth daily.   Magnesium 500 MG CAPS Take by mouth every other day.   No facility-administered medications prior to visit.    Review of Systems     Objective    BP 132/70   Pulse 72   Wt 134 lb 9.6 oz (61.1 kg)   SpO2 100%   BMI 24.62 kg/m    Physical Exam Vitals reviewed.  Constitutional:      General: She is not in acute distress.    Appearance: Normal appearance. She is well-developed. She is not diaphoretic.  HENT:     Head: Normocephalic and atraumatic.  Eyes:     General: No scleral icterus.    Conjunctiva/sclera: Conjunctivae normal.  Neck:     Thyroid: No thyromegaly.  Cardiovascular:     Rate and Rhythm: Normal rate and regular rhythm.     Pulses: Normal pulses.     Heart sounds: Normal heart sounds. No murmur heard. Pulmonary:     Effort: Pulmonary effort is normal. No respiratory distress.     Breath sounds: Normal breath sounds. No wheezing, rhonchi or rales.  Musculoskeletal:     Cervical back: Neck supple.     Right lower leg: No edema.     Left lower leg: No edema.  Lymphadenopathy:     Cervical: No cervical adenopathy.  Skin:    General: Skin is warm and dry.     Findings: No rash.  Neurological:     Mental Status: She is alert and oriented to person, place, and time. Mental status is at baseline.  Psychiatric:        Mood and Affect: Mood normal.        Behavior: Behavior normal.       No results found for any visits on 12/04/22.  Assessment & Plan     Problem List Items Addressed This Visit       Cardiovascular and Mediastinum   Primary hypertension - Primary     Blood pressure well-controlled at 132/70. -Continue current medication regimen: Diltiazem, Losartan, and baby aspirin.      Relevant Medications   losartan (COZAAR) 25 MG tablet     Nervous and Auditory   Dementia (HCC)    Patient reports improved symptoms with current medication regimen, including reduced delusions and improved mood and appetite. Potential dementia per neuro and family -Continue current medication regimen. - f/b Neuro      Relevant Medications   escitalopram (LEXAPRO) 10 MG tablet     Other   GAD (generalized anxiety disorder)    Chronic and well controlled Continue lexapro Ok to use low dose xanax sparingly - have  discussed risks      Relevant Medications   escitalopram (LEXAPRO) 10 MG tablet   Hypercholesteremia    Stable cholesterol levels. -Continue Zetia.      Relevant Medications   losartan (COZAAR) 25 MG tablet   Prediabetes    Recommend low carb diet Recheck A1c at next visit      MGUS (monoclonal gammopathy of unknown significance)    Discussed the nature of MGUS as a pre-multiple myeloma condition with a small risk of progression. Patient is aware and not anxious about it. -Monitor with SPEP and light chain tests per Onc every six months.          Follow-up: Patient is stable with no new complaints. -Schedule follow-up appointment in October 2024.        Return in about 4 months (around 04/05/2023) for chronic disease f/u.      I, Shirlee Latch, MD, have reviewed all documentation for this visit. The documentation on 12/12/22 for the exam, diagnosis, procedures, and orders are all accurate and complete.   Erasmo Downer, MD, MPH [facility]    Addendum 12/12/22: See phone note re potential dementia

## 2022-12-04 NOTE — Assessment & Plan Note (Signed)
Stable cholesterol levels. -Continue Zetia.

## 2022-12-04 NOTE — Assessment & Plan Note (Signed)
Blood pressure well-controlled at 132/70. -Continue current medication regimen: Diltiazem, Losartan, and baby aspirin.

## 2022-12-04 NOTE — Assessment & Plan Note (Addendum)
Patient reports improved symptoms with current medication regimen, including reduced delusions and improved mood and appetite. Potential dementia per neuro and family -Continue current medication regimen. - f/b Neuro

## 2022-12-06 ENCOUNTER — Telehealth: Payer: Self-pay

## 2022-12-06 NOTE — Telephone Encounter (Signed)
I believe this is a new diagnosis and thought you may want to see and/or comment this call before I sent it to the nurse box.    Copied from CRM [id number]. Topic: General - Other >> Dec 06, 2022  8:25 AM Isabel Gonzalez E wrote: Reason for CRM: Pt has a question about her dementia diagnosis, she says that she understood that she was not diagnosed however her chart reflects otherwise. Please advise patient would like to speak to the clinic today   Best contact:  [phone]

## 2022-12-07 NOTE — Telephone Encounter (Signed)
I suppose both myself and our scribe program mis-understood. I understood that she was telling me that Dr Sherryll Burger did diagnose her with dementia and was treating with seroquel.  Can she clarify this and we will update it in the chart.

## 2022-12-12 NOTE — Telephone Encounter (Signed)
Note updated to reflect this

## 2022-12-20 DIAGNOSIS — D225 Melanocytic nevi of trunk: Secondary | ICD-10-CM | POA: Diagnosis not present

## 2022-12-20 DIAGNOSIS — D2261 Melanocytic nevi of right upper limb, including shoulder: Secondary | ICD-10-CM | POA: Diagnosis not present

## 2022-12-20 DIAGNOSIS — D2262 Melanocytic nevi of left upper limb, including shoulder: Secondary | ICD-10-CM | POA: Diagnosis not present

## 2022-12-20 DIAGNOSIS — D2271 Melanocytic nevi of right lower limb, including hip: Secondary | ICD-10-CM | POA: Diagnosis not present

## 2022-12-20 DIAGNOSIS — L821 Other seborrheic keratosis: Secondary | ICD-10-CM | POA: Diagnosis not present

## 2022-12-20 DIAGNOSIS — L84 Corns and callosities: Secondary | ICD-10-CM | POA: Diagnosis not present

## 2022-12-20 DIAGNOSIS — D2272 Melanocytic nevi of left lower limb, including hip: Secondary | ICD-10-CM | POA: Diagnosis not present

## 2022-12-25 ENCOUNTER — Other Ambulatory Visit: Payer: Self-pay | Admitting: Cardiovascular Disease

## 2023-01-23 DIAGNOSIS — H401131 Primary open-angle glaucoma, bilateral, mild stage: Secondary | ICD-10-CM | POA: Diagnosis not present

## 2023-01-31 DIAGNOSIS — G3184 Mild cognitive impairment, so stated: Secondary | ICD-10-CM | POA: Diagnosis not present

## 2023-01-31 DIAGNOSIS — F03A0 Unspecified dementia, mild, without behavioral disturbance, psychotic disturbance, mood disturbance, and anxiety: Secondary | ICD-10-CM | POA: Diagnosis not present

## 2023-02-09 DIAGNOSIS — G3184 Mild cognitive impairment, so stated: Secondary | ICD-10-CM | POA: Diagnosis not present

## 2023-02-22 ENCOUNTER — Other Ambulatory Visit: Payer: Self-pay | Admitting: Cardiovascular Disease

## 2023-02-22 DIAGNOSIS — I10 Essential (primary) hypertension: Secondary | ICD-10-CM

## 2023-02-28 DIAGNOSIS — H401131 Primary open-angle glaucoma, bilateral, mild stage: Secondary | ICD-10-CM | POA: Diagnosis not present

## 2023-03-16 ENCOUNTER — Other Ambulatory Visit: Payer: Self-pay | Admitting: Family Medicine

## 2023-03-16 NOTE — Telephone Encounter (Signed)
Request is too soon for refill, last refill 12/04/22 for 90 and 1 refill.  Requested Prescriptions  Pending Prescriptions Disp Refills   losartan (COZAAR) 25 MG tablet [Pharmacy Med Name: LOSARTAN POTASSIUM 25 MG TAB] 90 tablet 1    Sig: TAKE ONE TABLET BY MOUTH DAILY.     Cardiovascular:  Angiotensin Receptor Blockers Passed - 03/16/2023  9:20 AM      Passed - Cr in normal range and within 180 days    Creatinine, Ser  Date Value Ref Range Status  10/20/2022 0.59 0.44 - 1.00 mg/dL Final         Passed - K in normal range and within 180 days    Potassium  Date Value Ref Range Status  10/20/2022 4.1 3.5 - 5.1 mmol/L Final         Passed - Patient is not pregnant      Passed - Last BP in normal range    BP Readings from Last 1 Encounters:  12/04/22 132/70         Passed - Valid encounter within last 6 months    Recent Outpatient Visits           3 months ago Primary hypertension   [place] [facility] [place], Marzella Schlein, MD   6 months ago Primary hypertension   [place] [facility] [place], Marzella Schlein, MD   7 months ago Encounter for annual wellness visit (AWV) in Medicare patient   [facility] [place], Marzella Schlein, MD   1 year ago Elevated BP without diagnosis of hypertension   [place] [facility] [place], [place], [name]   1 year ago Acute rhinosinusitis   [place] [facility] [place], [place], [name]       Future Appointments             In 1 month Bacigalupo, Marzella Schlein, MD [facility], [facility]

## 2023-04-11 ENCOUNTER — Ambulatory Visit: Payer: HMO | Admitting: Family Medicine

## 2023-04-12 ENCOUNTER — Encounter: Payer: Self-pay | Admitting: Family Medicine

## 2023-04-12 ENCOUNTER — Ambulatory Visit (INDEPENDENT_AMBULATORY_CARE_PROVIDER_SITE_OTHER): Payer: HMO | Admitting: Family Medicine

## 2023-04-12 VITALS — BP 144/71 | HR 65 | Temp 97.3°F | Resp 16 | Ht 62.0 in | Wt 139.4 lb

## 2023-04-12 DIAGNOSIS — G3184 Mild cognitive impairment, so stated: Secondary | ICD-10-CM | POA: Diagnosis not present

## 2023-04-12 DIAGNOSIS — S0502XA Injury of conjunctiva and corneal abrasion without foreign body, left eye, initial encounter: Secondary | ICD-10-CM

## 2023-04-12 MED ORDER — ERYTHROMYCIN 5 MG/GM OP OINT: 1.0000 | TOPICAL_OINTMENT | Freq: Four times a day (QID) | OPHTHALMIC | 0 refills | Status: AC

## 2023-04-12 NOTE — Progress Notes (Signed)
Acute Office Visit  Subjective:     Patient ID: Isabel Gonzalez, female    DOB: 06-05-1946, 77 y.o.   MRN: [id number]  Chief Complaint  Patient presents with   Eye Pain    Left eye pain, started when hit with eye drop container, was swollen and red next day     Eye Pain    Discussed the use of AI scribe software for clinical note transcription with the patient, who gave verbal consent to proceed.  History of Present Illness   The patient, with a history of mild cognitive impairment, presents with an eye issue after accidentally hitting her eye with a plastic tip of an eye drop bottle. The incident occurred on Saturday, and by the following day, the eye was a little red but okay. However, the day after, the eye was significantly swollen and closed. The patient has been applying compresses and eye drops to the affected eye. The patient reports that the eye was goopy to the point that she had to use warm water to open it in the mornings. The patient denies any vision changes or the presence of a bump on the eyelid. The patient reports that the eye is still a little sore but is getting better.  The patient also discusses her recent diagnosis of mild cognitive impairment. She expresses concern about her memory loss, which she feels is more than just dementia. She reports forgetting things more frequently and struggling with tasks she used to plan and execute efficiently. The patient is currently in the process of moving to a new home, which she acknowledges could be contributing to her increased memory issues.       Review of Systems  Eyes:  Positive for pain.        Objective:    BP (!) 144/71 (BP Location: Left Arm, Patient Position: Sitting, Cuff Size: Normal)   Pulse 65   Temp (!) 97.3 F (36.3 C)   Resp 16   Ht 5\' 2"  (1.575 m)   Wt 139 lb 6.4 oz (63.2 kg)   SpO2 100%   BMI 25.50 kg/m    Physical Exam Constitutional:      General: She is not in acute distress.     Appearance: Normal appearance.  HENT:     Head: Normocephalic.  Eyes:     General: Lids are everted, no foreign bodies appreciated. Vision grossly intact. Gaze aligned appropriately.        Left eye: No foreign body, discharge or hordeolum.     Extraocular Movements: Extraocular movements intact.     Conjunctiva/sclera:     Left eye: Left conjunctiva is injected.  Pulmonary:     Effort: Pulmonary effort is normal. No respiratory distress.  Neurological:     Mental Status: She is alert and oriented to person, place, and time. Mental status is at baseline.     No results found for any visits on 04/12/23.      Assessment & Plan:   Problem List Items Addressed This Visit   None Visit Diagnoses     Abrasion of left cornea, initial encounter    -  Primary   MCI (mild cognitive impairment)              Corneal Abrasion Likely self-inflicted with eye drop bottle. Initial symptoms of redness, swelling, and discharge have improved, but some soreness remains. No vision changes reported. -Prescribe erythromycin eye ointment, apply four times daily for five days. -If  symptoms worsen, patient to contact office.  Mild Cognitive Impairment Patient reports increased forgetfulness, but is still able to perform daily activities independently. Recent stressors (moving house) may be contributing to perceived increase in symptoms. -Reassure that cognitive changes can fluctuate, especially during periods of stress. -Encourage continued monitoring and report any significant changes.        Meds ordered this encounter  Medications   erythromycin ophthalmic ointment    Sig: Place 1 Application into the left eye 4 (four) times daily for 5 days.    Dispense:  3.5 g    Refill:  0    Return if symptoms worsen or fail to improve.  Shirlee Latch, MD

## 2023-04-23 ENCOUNTER — Other Ambulatory Visit: Payer: HMO

## 2023-04-25 ENCOUNTER — Other Ambulatory Visit: Payer: Self-pay | Admitting: Family Medicine

## 2023-04-26 NOTE — Telephone Encounter (Signed)
Reordered 12/04/22 #90 1 RF should have enough to last until 06/05/23  Requested Prescriptions  Refused Prescriptions Disp Refills   losartan (COZAAR) 25 MG tablet [Pharmacy Med Name: LOSARTAN POTASSIUM 25 MG TAB] 90 tablet 1    Sig: TAKE 1 TABLET BY MOUTH DAILY.     Cardiovascular:  Angiotensin Receptor Blockers Failed - 04/25/2023  2:47 PM      Failed - Cr in normal range and within 180 days    Creatinine, Ser  Date Value Ref Range Status  10/20/2022 0.59 0.44 - 1.00 mg/dL Final         Failed - K in normal range and within 180 days    Potassium  Date Value Ref Range Status  10/20/2022 4.1 3.5 - 5.1 mmol/L Final         Failed - Last BP in normal range    BP Readings from Last 1 Encounters:  04/12/23 (!) 144/71         Passed - Patient is not pregnant      Passed - Valid encounter within last 6 months    Recent Outpatient Visits           2 weeks ago Abrasion of left cornea, initial encounter   [place] [facility] [place], Marzella Schlein, MD   4 months ago Primary hypertension   [facility] [place], Marzella Schlein, MD   8 months ago Primary hypertension   [place] [facility] [place], Marzella Schlein, MD   9 months ago Encounter for annual wellness visit (AWV) in Medicare patient   [place] [facility] [place], Marzella Schlein, MD   1 year ago Elevated BP without diagnosis of hypertension   [place] [facility] [place], [place], [name]       Future Appointments             In 4 days Bacigalupo, Marzella Schlein, MD [facility]

## 2023-04-30 ENCOUNTER — Encounter: Payer: Self-pay | Admitting: Family Medicine

## 2023-04-30 ENCOUNTER — Ambulatory Visit (INDEPENDENT_AMBULATORY_CARE_PROVIDER_SITE_OTHER): Payer: HMO | Admitting: Family Medicine

## 2023-04-30 VITALS — BP 138/77 | HR 57 | Resp 10 | Ht 62.0 in | Wt 139.1 lb

## 2023-04-30 DIAGNOSIS — R7303 Prediabetes: Secondary | ICD-10-CM | POA: Diagnosis not present

## 2023-04-30 DIAGNOSIS — D472 Monoclonal gammopathy: Secondary | ICD-10-CM

## 2023-04-30 DIAGNOSIS — F411 Generalized anxiety disorder: Secondary | ICD-10-CM

## 2023-04-30 DIAGNOSIS — F3341 Major depressive disorder, recurrent, in partial remission: Secondary | ICD-10-CM | POA: Diagnosis not present

## 2023-04-30 DIAGNOSIS — I10 Essential (primary) hypertension: Secondary | ICD-10-CM

## 2023-04-30 DIAGNOSIS — E7800 Pure hypercholesterolemia, unspecified: Secondary | ICD-10-CM

## 2023-04-30 NOTE — Assessment & Plan Note (Signed)
Hyperlipidemia is managed with current medication. - Continue Zetia 10 mg daily

## 2023-04-30 NOTE — Assessment & Plan Note (Signed)
Chronic and stable Continue lexapro at current dose 

## 2023-04-30 NOTE — Assessment & Plan Note (Signed)
Anxiety is well-managed with current medications despite recent stressors. - Continue Lexapro 10 mg QHS - Continue alprazolam 0.5 mg, half to one tablet, QHS PRN for anxiety

## 2023-04-30 NOTE — Assessment & Plan Note (Signed)
Prediabetes requires monitoring of HbA1c levels. - Order HbA1c test

## 2023-04-30 NOTE — Progress Notes (Signed)
Established Patient Office Visit  Subjective   Patient ID: Isabel Gonzalez, female    DOB: September 16, 1945  Age: 77 y.o. MRN: [id number]  Chief Complaint  Patient presents with   Medical Management of Chronic Issues    4 month follow-up. Patient reports taking medications as prescribed with no symptoms to report.     HPI  Discussed the use of AI scribe software for clinical note transcription with the patient, who gave verbal consent to proceed.  History of Present Illness   A 77 year old patient with a history of hypertension, hyperlipidemia, generalized anxiety disorder, prediabetes, and MGUS presents for a routine follow-up visit. The patient is currently on losartan 25mg  daily, diltiazem 30mg  tid prn, diltiazem long-acting 120mg  daily, alprazolam 0.5mg  half to one tablet QHS PRN for anxiety, baby aspirin, Lexapro 10mg  QHS for anxiety, and Zetia 10mg  daily for cholesterol. The patient reports feeling better than the last visit and has no new concerns. The patient is currently under stress due to an upcoming move but reports that her blood pressure is still controlled. The patient denies any new symptoms or concerns related to her chronic conditions.         ROS    Objective:     BP 138/77   Pulse (!) 57   Resp 10   Ht 5\' 2"  (1.575 m)   Wt 139 lb 1.6 oz (63.1 kg)   BMI 25.44 kg/m    Physical Exam Vitals reviewed.  Constitutional:      General: She is not in acute distress.    Appearance: Normal appearance. She is well-developed. She is not diaphoretic.  HENT:     Head: Normocephalic and atraumatic.  Eyes:     General: No scleral icterus.    Conjunctiva/sclera: Conjunctivae normal.  Neck:     Thyroid: No thyromegaly.  Cardiovascular:     Rate and Rhythm: Normal rate and regular rhythm.     Heart sounds: Normal heart sounds. No murmur heard. Pulmonary:     Effort: Pulmonary effort is normal. No respiratory distress.     Breath sounds: Normal breath sounds. No  wheezing, rhonchi or rales.  Musculoskeletal:     Cervical back: Neck supple.     Right lower leg: No edema.     Left lower leg: No edema.  Lymphadenopathy:     Cervical: No cervical adenopathy.  Skin:    General: Skin is warm and dry.     Findings: No rash.  Neurological:     Mental Status: She is alert and oriented to person, place, and time. Mental status is at baseline.  Psychiatric:        Mood and Affect: Mood normal.        Behavior: Behavior normal.      No results found for any visits on 04/30/23.    The 10-year ASCVD risk score (Arnett DK, et al., 2019) is: 32.4%    Assessment & Plan:   Problem List Items Addressed This Visit       Cardiovascular and Mediastinum   Primary hypertension - Primary    Hypertension is well-controlled with current medication regimen despite recent stress related to moving. - Continue losartan 25 mg daily - Monitor blood pressure regularly      Relevant Orders   Comprehensive metabolic panel     Other   GAD (generalized anxiety disorder)    Anxiety is well-managed with current medications despite recent stressors. - Continue Lexapro 10 mg QHS - Continue  alprazolam 0.5 mg, half to one tablet, QHS PRN for anxiety      Hypercholesteremia    Hyperlipidemia is managed with current medication. - Continue Zetia 10 mg daily      Relevant Orders   Comprehensive metabolic panel   Lipid panel   MDD (major depressive disorder)    Chronic and stable Continue lexapro at current dose      Prediabetes    Prediabetes requires monitoring of HbA1c levels. - Order HbA1c test      Relevant Orders   Hemoglobin A1c   MGUS (monoclonal gammopathy of unknown significance)    Scheduled to follow up with oncology in January for routine monitoring. - Follow up with oncology in January           General Health Maintenance Routine monitoring of kidney and liver function, and cholesterol levels is due. - Order kidney function  tests - Order liver function tests - Order cholesterol panel  Follow-up - Schedule follow-up visit in six months.        Return in about 6 months (around 10/28/2023) for CPE.    Shirlee Latch, MD

## 2023-04-30 NOTE — Assessment & Plan Note (Signed)
Scheduled to follow up with oncology in January for routine monitoring. - Follow up with oncology in January

## 2023-04-30 NOTE — Assessment & Plan Note (Signed)
Hypertension is well-controlled with current medication regimen despite recent stress related to moving. - Continue losartan 25 mg daily - Monitor blood pressure regularly

## 2023-05-01 LAB — COMPREHENSIVE METABOLIC PANEL
ALT: 18 [IU]/L (ref 0–32)
AST: 21 [IU]/L (ref 0–40)
Albumin: 4.5 g/dL (ref 3.8–4.8)
Alkaline Phosphatase: 81 [IU]/L (ref 44–121)
BUN/Creatinine Ratio: 21 (ref 12–28)
BUN: 15 mg/dL (ref 8–27)
Bilirubin Total: 0.7 mg/dL (ref 0.0–1.2)
CO2: 23 mmol/L (ref 20–29)
Calcium: 10.4 mg/dL — ABNORMAL HIGH (ref 8.7–10.3)
Chloride: 103 mmol/L (ref 96–106)
Creatinine, Ser: 0.73 mg/dL (ref 0.57–1.00)
Globulin, Total: 2 g/dL (ref 1.5–4.5)
Glucose: 110 mg/dL — ABNORMAL HIGH (ref 70–99)
Potassium: 4.8 mmol/L (ref 3.5–5.2)
Sodium: 141 mmol/L (ref 134–144)
Total Protein: 6.5 g/dL (ref 6.0–8.5)
eGFR: 84 mL/min/{1.73_m2} (ref >59)

## 2023-05-01 LAB — HEMOGLOBIN A1C
Est. average glucose Bld gHb Est-mCnc: 123 mg/dL
Hgb A1c MFr Bld: 5.9 % — ABNORMAL HIGH (ref 4.8–5.6)

## 2023-05-01 LAB — LIPID PANEL
Chol/HDL Ratio: 3.5 ratio (ref 0.0–4.4)
Cholesterol, Total: 221 mg/dL — ABNORMAL HIGH (ref 100–199)
HDL: 64 mg/dL (ref >39)
LDL Chol Calc (NIH): 125 mg/dL — ABNORMAL HIGH (ref 0–99)
Triglycerides: 185 mg/dL — ABNORMAL HIGH (ref 0–149)
VLDL Cholesterol Cal: 32 mg/dL (ref 5–40)

## 2023-05-07 ENCOUNTER — Ambulatory Visit: Payer: HMO | Admitting: Oncology

## 2023-05-18 ENCOUNTER — Other Ambulatory Visit: Payer: Self-pay | Admitting: Family Medicine

## 2023-06-18 ENCOUNTER — Inpatient Hospital Stay: Payer: HMO | Attending: Oncology

## 2023-06-18 DIAGNOSIS — Z87891 Personal history of nicotine dependence: Secondary | ICD-10-CM | POA: Diagnosis not present

## 2023-06-18 DIAGNOSIS — D472 Monoclonal gammopathy: Secondary | ICD-10-CM | POA: Insufficient documentation

## 2023-06-18 LAB — CBC WITH DIFFERENTIAL (CANCER CENTER ONLY)
Abs Immature Granulocytes: 0.02 10*3/uL (ref 0.00–0.07)
Basophils Absolute: 0 10*3/uL (ref 0.0–0.1)
Basophils Relative: 1 %
Eosinophils Absolute: 0.1 10*3/uL (ref 0.0–0.5)
Eosinophils Relative: 1 %
HCT: 45.1 % (ref 36.0–46.0)
Hemoglobin: 14.5 g/dL (ref 12.0–15.0)
Immature Granulocytes: 0 %
Lymphocytes Relative: 19 %
Lymphs Abs: 1.2 10*3/uL (ref 0.7–4.0)
MCH: 31.6 pg (ref 26.0–34.0)
MCHC: 32.2 g/dL (ref 30.0–36.0)
MCV: 98.3 fL (ref 80.0–100.0)
Monocytes Absolute: 0.5 10*3/uL (ref 0.1–1.0)
Monocytes Relative: 7 %
Neutro Abs: 4.6 10*3/uL (ref 1.7–7.7)
Neutrophils Relative %: 72 %
Platelet Count: 298 10*3/uL (ref 150–400)
RBC: 4.59 MIL/uL (ref 3.87–5.11)
RDW: 12.9 % (ref 11.5–15.5)
WBC Count: 6.4 10*3/uL (ref 4.0–10.5)
nRBC: 0 % (ref 0.0–0.2)

## 2023-06-18 LAB — CMP (CANCER CENTER ONLY)
ALT: 20 U/L (ref 0–44)
AST: 24 U/L (ref 15–41)
Albumin: 4.1 g/dL (ref 3.5–5.0)
Alkaline Phosphatase: 62 U/L (ref 38–126)
Anion gap: 9 (ref 5–15)
BUN: 17 mg/dL (ref 8–23)
CO2: 26 mmol/L (ref 22–32)
Calcium: 9.8 mg/dL (ref 8.9–10.3)
Chloride: 102 mmol/L (ref 98–111)
Creatinine: 0.61 mg/dL (ref 0.44–1.00)
GFR, Estimated: >60 mL/min (ref >60)
Glucose, Bld: 93 mg/dL (ref 70–99)
Potassium: 4.3 mmol/L (ref 3.5–5.1)
Sodium: 137 mmol/L (ref 135–145)
Total Bilirubin: 1.4 mg/dL — ABNORMAL HIGH (ref 0.0–1.2)
Total Protein: 7.2 g/dL (ref 6.5–8.1)

## 2023-06-19 LAB — KAPPA/LAMBDA LIGHT CHAINS
Kappa free light chain: 15.4 mg/L (ref 3.3–19.4)
Kappa, lambda light chain ratio: 1.24 (ref 0.26–1.65)
Lambda free light chains: 12.4 mg/L (ref 5.7–26.3)

## 2023-06-26 LAB — MULTIPLE MYELOMA PANEL, SERUM
Albumin SerPl Elph-Mcnc: 3.7 g/dL (ref 2.9–4.4)
Albumin/Glob SerPl: 1.3 (ref 0.7–1.7)
Alpha 1: 0.2 g/dL (ref 0.0–0.4)
Alpha2 Glob SerPl Elph-Mcnc: 0.7 g/dL (ref 0.4–1.0)
B-Globulin SerPl Elph-Mcnc: 1.2 g/dL (ref 0.7–1.3)
Gamma Glob SerPl Elph-Mcnc: 0.8 g/dL (ref 0.4–1.8)
Globulin, Total: 2.9 g/dL (ref 2.2–3.9)
IgA: 165 mg/dL (ref 64–422)
IgG (Immunoglobin G), Serum: 947 mg/dL (ref 586–1602)
IgM (Immunoglobulin M), Srm: 53 mg/dL (ref 26–217)
Total Protein ELP: 6.6 g/dL (ref 6.0–8.5)

## 2023-06-28 ENCOUNTER — Inpatient Hospital Stay: Payer: HMO | Admitting: Oncology

## 2023-06-28 ENCOUNTER — Inpatient Hospital Stay: Payer: HMO

## 2023-06-28 ENCOUNTER — Encounter: Payer: Self-pay | Admitting: Oncology

## 2023-06-28 VITALS — BP 137/65 | HR 69 | Temp 96.0°F | Resp 18 | Wt 139.8 lb

## 2023-06-28 DIAGNOSIS — D472 Monoclonal gammopathy: Secondary | ICD-10-CM | POA: Diagnosis not present

## 2023-06-28 LAB — BILIRUBIN, FRACTIONATED(TOT/DIR/INDIR)
Bilirubin, Direct: 0.1 mg/dL (ref 0.0–0.2)
Indirect Bilirubin: 1 mg/dL — ABNORMAL HIGH (ref 0.3–0.9)
Total Bilirubin: 1.1 mg/dL (ref 0.0–1.2)

## 2023-06-28 NOTE — Assessment & Plan Note (Signed)
Check fractionated bilirubin.  Increased total bilirubin is due to increased indirect hyperbilirubinemia.  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

## 2023-06-28 NOTE — Progress Notes (Signed)
Hematology/Oncology Consult note [phone] [phone]   REFERRING PROVIDER: Erasmo Downer, MD  CHIEF COMPLAINTS/REASON FOR VISIT:  Follow up IgG lamda MGUS  ASSESSMENT & PLAN:   MGUS (monoclonal gammopathy of unknown significance) I discussed with patient about the diagnosis of IgG lamda MGUS which is an asymptomatic condition which has a small risk of progression to smoldering multiple myeloma and to symptomatic multiple myeloma. Less frequently, these patients progress to AL amyloidosis, light chain deposition disease, or another lymphoproliferative disorder. Lab Results  Component Value Date   MPROTEIN Not Observed 06/18/2023   KPAFRELGTCHN 15.4 06/18/2023   LAMBDASER 12.4 06/18/2023   KAPLAMBRATIO 1.24 06/18/2023     M protein is not detectable. IFE showed lamda MGUS For now I recommend observation.   Hyperbilirubinemia Check fractionated bilirubin.  Increased total bilirubin is due to increased indirect hyperbilirubinemia.  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    Orders Placed This Encounter  Procedures   Bilirubin, fractionated(tot/dir/indir)    Standing Status:   Future    Number of Occurrences:   1    Expected Date:   06/28/2023    Expiration Date:   06/27/2024   CMP (Cancer Center only)    Standing Status:   Future    Expected Date:   12/26/2023    Expiration Date:   06/27/2024   CBC with Differential (Cancer Center Only)    Standing Status:   Future    Expected Date:   12/26/2023    Expiration Date:   06/27/2024   Kappa/lambda light chains    Standing Status:   Future    Expected Date:   12/26/2023    Expiration Date:   06/27/2024   Multiple Myeloma Panel (SPEP&IFE w/QIG)    Standing Status:   Future    Expected Date:   12/26/2023    Expiration Date:   06/27/2024   Follow up in 6 months.  All questions were answered. The patient knows to call the clinic with any problems, questions or concerns.  Rickard Patience, MD,  PhD [facility] Hematology Oncology 06/28/2023    HISTORY OF PRESENTING ILLNESS:  Isabel Gonzalez is a 78 y.o. female who was seen in consultation at the request of Bacigalupo, Marzella Schlein, MD for evaluation of abnormal SPEP results.  Patient is accompanied by her son today. Patient recently had work up done at [employer] office. Labs reviewed,  SPEP showed M spike 0.2,   IgG lamda  She denies history of abnormal bone pain or bone fracture. Patient denies history of recurrent infection or atypical infections such as shingles of meningitis. Denies chills, night sweats, anorexia or abnormal weight loss.  She follows up with neurology for transient facial paresthesia, transient focal neurologic symptoms of left hand and bilateral lower face, manage impairment and mild cognitive impairment.  INTERVAL HISTORY Isabel Gonzalez is a 78 y.o. female who has above history reviewed by me today presents for follow up visit for MGUS  She has no complaints. Upcoming trip to [place]  MEDICAL HISTORY:  Past Medical History:  Diagnosis Date   Anemia    Anxiety    Arthritis    Cancer (HCC)    squamous cell   Constipation    Dysrhythmia    palpitations    Environmental allergies    GERD (gastroesophageal reflux disease)    History of bladder infections    Hyperlipidemia    Murmur    Urinary problem  SURGICAL HISTORY: Past Surgical History:  Procedure Laterality Date   BREAST REDUCTION SURGERY     FACIAL COSMETIC SURGERY     LAPAROTOMY N/A 02/24/2017   Procedure: EXPLORATORY LAPAROTOMY and repair small hernia;  Surgeon: Leafy Ro, MD;  Location: [facility] ORS;  Service: General;  Laterality: N/A;   ORIF HUMERUS FRACTURE Right 07/08/2015   Procedure: OPEN REDUCTION INTERNAL FIXATION (ORIF) HUMERAL SHAFT FRACTURE with bone graft and allograft;  Surgeon: Francena Hanly, MD;  Location: [facility] OR;  Service: Orthopedics;  Laterality: Right;   SKIN SURGERY     squamous cell     SOCIAL HISTORY: Social History   Socioeconomic History   Marital status: Widowed    Spouse name: Not on file   Number of children: 3   Years of education: Not on file   Highest education level: Some college, no degree  Occupational History   Occupation: retired  Tobacco Use   Smoking status: Former    Current packs/day: 0.00    Average packs/day: 0.3 packs/day for 25.0 years (6.3 ttl pk-yrs)    Types: Cigarettes    Start date: 06/11/1965    Quit date: 06/11/1990    Years since quitting: 33.0   Smokeless tobacco: Never  Vaping Use   Vaping status: Never Used  Substance and Sexual Activity   Alcohol use: Yes    Alcohol/week: 7.0 - 11.0 standard drinks of alcohol    Types: 7 - 11 Glasses of wine per week    Comment: 1-1.5 daily   Drug use: No   Sexual activity: Not on file  Other Topics Concern   Not on file  Social History Narrative   Regular exercise         Social Drivers of Health   Financial Resource Strain: Low Risk  (07/06/2021)   Overall Financial Resource Strain (CARDIA)    Difficulty of Paying Living Expenses: Not hard at all  Food Insecurity: No Food Insecurity (07/06/2021)   Hunger Vital Sign    Worried About Running Out of Food in the Last Year: Never true    Ran Out of Food in the Last Year: Never true  Transportation Needs: No Transportation Needs (07/06/2021)   PRAPARE - Administrator, Civil Service (Medical): No    Lack of Transportation (Non-Medical): No  Physical Activity: Sufficiently Active (07/06/2021)   Exercise Vital Sign    Days of Exercise per Week: 4 days    Minutes of Exercise per Session: 60 min  Stress: No Stress Concern Present (07/06/2021)   [employer] of Occupational Health - Occupational Stress Questionnaire    Feeling of Stress : Not at all  Social Connections: Moderately Integrated (07/06/2021)   Social Connection and Isolation Panel [NHANES]    Frequency of Communication with Friends and Family: More than  three times a week    Frequency of Social Gatherings with Friends and Family: More than three times a week    Attends Religious Services: More than 4 times per year    Active Member of [employer] or Organizations: Yes    Attends Banker Meetings: More than 4 times per year    Marital Status: Widowed  Intimate Partner Violence: Not At Risk (07/06/2021)   Humiliation, Afraid, Rape, and Kick questionnaire    Fear of Current or Ex-Partner: No    Emotionally Abused: No    Physically Abused: No    Sexually Abused: No    FAMILY HISTORY: Family History  Problem Relation Age  of Onset   Stroke Mother 4   Diabetes Brother    Stroke Brother     ALLERGIES:  is allergic to hydrocodone-acetaminophen, percocet [oxycodone-acetaminophen], and vicodin [hydrocodone-acetaminophen].  MEDICATIONS:  Current Outpatient Medications  Medication Sig Dispense Refill   ALPRAZolam (XANAX) 0.5 MG tablet TAKE 1/2 TO 1 TABLET BY MOUTH AT BEDTIMEAS NEEDED FOR ANXIETY 15 tablet 0   Apoaequorin (PREVAGEN) 10 MG CAPS Take 10 mg by mouth daily.     aspirin 81 MG EC tablet Take 81 mg by mouth daily.     Cholecalciferol (VITAMIN D) 50 MCG (2000 UT) tablet Take 2,000 Units by mouth every other day.     diltiazem (CARDIZEM CD) 120 MG 24 hr capsule Take 1 capsule (120 mg total) by mouth daily. 90 capsule 3   diltiazem (CARDIZEM) 30 MG tablet TAKE 1 TABLET BY MOUTH 3 TIMES DAILY AS NEEDED. 90 tablet 6   escitalopram (LEXAPRO) 10 MG tablet Take 1 tablet (10 mg total) by mouth at bedtime. 90 tablet 3   ezetimibe (ZETIA) 10 MG tablet TAKE 1 TABLET BY MOUTH ONCE DAILY 90 tablet 2   losartan (COZAAR) 25 MG tablet Take 1 tablet (25 mg total) by mouth daily. 90 tablet 1   vitamin B-12 (CYANOCOBALAMIN) 1000 MCG tablet Take 1 tablet (1,000 mcg total) by mouth daily. 30 tablet 0   vitamin C (ASCORBIC ACID) 500 MG tablet Take 500 mg by mouth daily.     azelastine (OPTIVAR) 0.05 % ophthalmic solution Place 1 drop into both  eyes 2 (two) times daily. Use as ear drops (Patient not taking: Reported on 06/28/2023) 6 mL 0   bimatoprost (LUMIGAN) 0.03 % ophthalmic solution  (Patient not taking: Reported on 06/28/2023)     latanoprost (XALATAN) 0.005 % ophthalmic solution Place 1 drop into both eyes at bedtime. (Patient not taking: Reported on 06/28/2023)     Magnesium 500 MG CAPS Take by mouth every other day. (Patient not taking: Reported on 06/28/2023)     Misc Natural Products (ELDERBERRY ZINC/VIT C/IMMUNE MT) Take 1 tablet by mouth daily at 6 (six) AM. With Vitamin D (Patient not taking: Reported on 06/28/2023)     QUEtiapine (SEROQUEL) 25 MG tablet Take by mouth. (Patient not taking: Reported on 06/28/2023)     triamcinolone cream (KENALOG) 0.1 % Apply 1 application topically 2 (two) times daily. (Patient not taking: Reported on 06/28/2023) 30 g 0   No current facility-administered medications for this visit.    Review of Systems  Constitutional:  Negative for appetite change, chills, fatigue and fever.  HENT:   Negative for hearing loss and voice change.   Eyes:  Negative for eye problems.  Respiratory:  Negative for chest tightness and cough.   Cardiovascular:  Negative for chest pain.  Gastrointestinal:  Negative for abdominal distention, abdominal pain and blood in stool.  Endocrine: Negative for hot flashes.  Genitourinary:  Negative for difficulty urinating and frequency.   Musculoskeletal:  Negative for arthralgias.  Skin:  Negative for itching and rash.  Neurological:  Negative for extremity weakness.  Hematological:  Negative for adenopathy.  Psychiatric/Behavioral:  Negative for confusion.        Memory loss   PHYSICAL EXAMINATION: ECOG PERFORMANCE STATUS: 0 - Asymptomatic Vitals:   06/28/23 1023  BP: 137/65  Pulse: 69  Resp: 18  Temp: (!) 96 F (35.6 C)  SpO2: 98%   Filed Weights   06/28/23 1023  Weight: 139 lb 12.8 oz (63.4 kg)  Physical Exam Constitutional:      General: She is not in  acute distress. HENT:     Head: Normocephalic and atraumatic.  Eyes:     General: No scleral icterus. Cardiovascular:     Rate and Rhythm: Normal rate and regular rhythm.     Heart sounds: Normal heart sounds.  Pulmonary:     Effort: Pulmonary effort is normal. No respiratory distress.     Breath sounds: No wheezing.  Abdominal:     General: Bowel sounds are normal. There is no distension.     Palpations: Abdomen is soft.  Musculoskeletal:        General: No deformity. Normal range of motion.     Cervical back: Normal range of motion and neck supple.  Skin:    General: Skin is warm and dry.     Findings: No erythema or rash.  Neurological:     Mental Status: She is alert and oriented to person, place, and time. Mental status is at baseline.     Cranial Nerves: No cranial nerve deficit.  Psychiatric:        Mood and Affect: Mood normal.      LABORATORY DATA:  I have reviewed the data as listed    Latest Ref Rng & Units 06/18/2023   11:18 AM 10/20/2022   10:22 AM 11/04/2020   12:00 AM  CBC  WBC 4.0 - 10.5 K/uL 6.4  6.7  6.4      Hemoglobin 12.0 - 15.0 g/dL 16.1  09.6  04.5      Hematocrit 36.0 - 46.0 % 45.1  42.2  43      Platelets 150 - 400 K/uL 298  321  319         This result is from an external source.      Latest Ref Rng & Units 06/28/2023   11:12 AM 06/18/2023   11:18 AM 04/30/2023   11:20 AM  CMP  Glucose 70 - 99 mg/dL  93  409   BUN 8 - 23 mg/dL  17  15   Creatinine 8.11 - 1.00 mg/dL  9.14  7.82   Sodium 956 - 145 mmol/L  137  141   Potassium 3.5 - 5.1 mmol/L  4.3  4.8   Chloride 98 - 111 mmol/L  102  103   CO2 22 - 32 mmol/L  26  23   Calcium 8.9 - 10.3 mg/dL  9.8  21.3   Total Protein 6.5 - 8.1 g/dL  7.2  6.5   Total Bilirubin 0.0 - 1.2 mg/dL 1.1  1.4  0.7   Alkaline Phos 38 - 126 U/L  62  81   AST 15 - 41 U/L  24  21   ALT 0 - 44 U/L  20  18    Lab Results  Component Value Date   MPROTEIN Not Observed 06/18/2023   MPROTEIN 0.2 (H) 10/20/2022    KAPLAMBRATIO 1.24 06/18/2023   KAPLAMBRATIO 1.09 10/20/2022          RADIOGRAPHIC STUDIES: I have personally reviewed the radiological images as listed and agreed with the findings in the report.  No results found.

## 2023-06-28 NOTE — Assessment & Plan Note (Addendum)
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 Less frequently, these patients progress to AL amyloidosis, light chain deposition disease, or another lymphoproliferative disorder. Lab Results  Component Value Date   MPROTEIN Not Observed 06/18/2023   KPAFRELGTCHN 15.4 06/18/2023   LAMBDASER 12.4 06/18/2023   KAPLAMBRATIO 1.24 06/18/2023     M protein is not detectable. IFE showed lamda MGUS For now I recommend observation.

## 2023-07-26 ENCOUNTER — Other Ambulatory Visit: Payer: Self-pay | Admitting: Family Medicine

## 2023-07-27 NOTE — Telephone Encounter (Signed)
Requested Prescriptions  Pending Prescriptions Disp Refills   losartan (COZAAR) 25 MG tablet [Pharmacy Med Name: LOSARTAN POTASSIUM 25 MG TAB] 90 tablet 1    Sig: TAKE 1 TABLET BY MOUTH DAILY.     Cardiovascular:  Angiotensin Receptor Blockers Passed - 07/27/2023 11:28 AM      Passed - Cr in normal range and within 180 days    Creatinine  Date Value Ref Range Status  06/18/2023 0.61 0.44 - 1.00 mg/dL Final         Passed - K in normal range and within 180 days    Potassium  Date Value Ref Range Status  06/18/2023 4.3 3.5 - 5.1 mmol/L Final         Passed - Patient is not pregnant      Passed - Last BP in normal range    BP Readings from Last 1 Encounters:  06/28/23 137/65         Passed - Valid encounter within last 6 months    Recent Outpatient Visits           2 months ago Primary hypertension   [place] [place], Marzella Schlein, MD   3 months ago Abrasion of left cornea, initial encounter   [facility] [place], Marzella Schlein, MD   7 months ago Primary hypertension   [facility] [place], Marzella Schlein, MD   11 months ago Primary hypertension   [place] [facility] [place], Marzella Schlein, MD   1 year ago Encounter for annual wellness visit (AWV) in Medicare patient   [facility] [place], Marzella Schlein, MD       Future Appointments             In 3 months Bacigalupo, Marzella Schlein, MD [facility]

## 2023-08-10 ENCOUNTER — Other Ambulatory Visit: Payer: HMO

## 2023-08-20 ENCOUNTER — Ambulatory Visit: Payer: HMO | Admitting: Oncology

## 2023-09-06 DIAGNOSIS — F03A0 Unspecified dementia, mild, without behavioral disturbance, psychotic disturbance, mood disturbance, and anxiety: Secondary | ICD-10-CM | POA: Diagnosis not present

## 2023-09-06 DIAGNOSIS — R569 Unspecified convulsions: Secondary | ICD-10-CM | POA: Diagnosis not present

## 2023-09-06 DIAGNOSIS — I10 Essential (primary) hypertension: Secondary | ICD-10-CM | POA: Diagnosis not present

## 2023-09-07 ENCOUNTER — Other Ambulatory Visit: Payer: Self-pay | Admitting: Family Medicine

## 2023-09-07 ENCOUNTER — Other Ambulatory Visit: Payer: Self-pay | Admitting: Cardiovascular Disease

## 2023-09-07 DIAGNOSIS — F411 Generalized anxiety disorder: Secondary | ICD-10-CM

## 2023-09-20 ENCOUNTER — Other Ambulatory Visit: Payer: Self-pay | Admitting: Family Medicine

## 2023-09-20 DIAGNOSIS — F411 Generalized anxiety disorder: Secondary | ICD-10-CM

## 2023-09-21 NOTE — Telephone Encounter (Signed)
 Too soon for refill for Lexapro. Last refill for losartan 07/25/23 for 90, will need refill.  Requested Prescriptions  Pending Prescriptions Disp Refills   escitalopram (LEXAPRO) 10 MG tablet [Pharmacy Med Name: ESCITALOPRAM OXALATE 10 MG TAB] 90 tablet 3    Sig: TAKE 1 TABLET BY MOUTH AT BEDTIME.     Psychiatry:  Antidepressants - SSRI Failed - 09/21/2023  8:12 AM      Failed - Valid encounter within last 6 months    Recent Outpatient Visits   None     Future Appointments             In 1 month Bacigalupo, Marzella Schlein, MD [facility]            Passed - Completed PHQ-2 or PHQ-9 in the last 360 days       losartan (COZAAR) 25 MG tablet [Pharmacy Med Name: LOSARTAN POTASSIUM 25 MG TAB] 90 tablet 0    Sig: TAKE 1 TABLET BY MOUTH DAILY.     Cardiovascular:  Angiotensin Receptor Blockers Failed - 09/21/2023  8:12 AM      Failed - Valid encounter within last 6 months    Recent Outpatient Visits   None     Future Appointments             In 1 month Bacigalupo, Marzella Schlein, MD [facility]            Passed - Cr in normal range and within 180 days    Creatinine  Date Value Ref Range Status  06/18/2023 0.61 0.44 - 1.00 mg/dL Final         Passed - K in normal range and within 180 days    Potassium  Date Value Ref Range Status  06/18/2023 4.3 3.5 - 5.1 mmol/L Final         Passed - Patient is not pregnant      Passed - Last BP in normal range    BP Readings from Last 1 Encounters:  06/28/23 137/65

## 2023-09-26 ENCOUNTER — Other Ambulatory Visit: Payer: Self-pay | Admitting: Family Medicine

## 2023-09-26 DIAGNOSIS — F411 Generalized anxiety disorder: Secondary | ICD-10-CM

## 2023-10-29 ENCOUNTER — Encounter: Payer: Self-pay | Admitting: Family Medicine

## 2023-10-29 ENCOUNTER — Ambulatory Visit (INDEPENDENT_AMBULATORY_CARE_PROVIDER_SITE_OTHER): Payer: Self-pay | Admitting: Family Medicine

## 2023-10-29 VITALS — BP 112/71 | HR 63 | Ht 62.0 in | Wt 140.9 lb

## 2023-10-29 DIAGNOSIS — I700 Atherosclerosis of aorta: Secondary | ICD-10-CM | POA: Diagnosis not present

## 2023-10-29 DIAGNOSIS — F0390 Unspecified dementia without behavioral disturbance: Secondary | ICD-10-CM | POA: Diagnosis not present

## 2023-10-29 DIAGNOSIS — I10 Essential (primary) hypertension: Secondary | ICD-10-CM

## 2023-10-29 DIAGNOSIS — F3341 Major depressive disorder, recurrent, in partial remission: Secondary | ICD-10-CM | POA: Diagnosis not present

## 2023-10-29 DIAGNOSIS — R7303 Prediabetes: Secondary | ICD-10-CM

## 2023-10-29 DIAGNOSIS — F411 Generalized anxiety disorder: Secondary | ICD-10-CM | POA: Diagnosis not present

## 2023-10-29 DIAGNOSIS — E7800 Pure hypercholesterolemia, unspecified: Secondary | ICD-10-CM | POA: Diagnosis not present

## 2023-10-29 NOTE — Assessment & Plan Note (Signed)
 Depression has improved after moving to a new environment, aided by insight into the situation. - Continue current management.

## 2023-10-29 NOTE — Assessment & Plan Note (Signed)
 Memory has improved, per patient report, possibly due to Namenda, with acknowledgment of its waxing and waning nature. - Continue Namenda 5 mg once daily.

## 2023-10-29 NOTE — Assessment & Plan Note (Signed)
 Hyperlipidemia is managed with current medication. - Continue Zetia  10 mg daily - recheck at CPE

## 2023-10-29 NOTE — Assessment & Plan Note (Signed)
 Anxiety is well-managed with rare use of Xanax , indicating good control. Initial anxiety and depression after moving to a new environment have improved with time and insight. - Continue Lexapro  10 mg daily. - Use Xanax  as needed for anxiety.

## 2023-10-29 NOTE — Progress Notes (Signed)
 Established patient visit   Patient: Isabel Gonzalez   DOB: August 19, 1945   78 y.o. Female  MRN: [id number] Visit Date: 10/29/2023  Today's healthcare provider: Aden Agreste, MD   Chief Complaint  Patient presents with   Medical Management of Chronic Issues   Hypertension   Anxiety   Subjective    Hypertension Associated symptoms include anxiety.  Anxiety       Discussed the use of AI scribe software for clinical note transcription with the patient, who gave verbal consent to proceed.  History of Present Illness   Isabel Gonzalez is a 78 year old female with hypertension and memory issues who presents for a follow-up visit.  Blood pressure management is stable with home readings around 112/70 mmHg, though elevated during office visits, suggesting possible white coat hypertension. Current medications include losartan  25 mg daily, diltiazem  120 mg daily, and a baby aspirin . No medication refills are needed.  Memory issues are managed with Namenda 5 mg once daily, with significant improvement noted. She is under neurology care for cognitive impairment.  Anxiety is managed with Lexapro  10 mg daily and Xanax  as needed, though Xanax  is rarely used. She experienced depression after moving but has since adjusted, with mood and anxiety now stable.           10/29/2023   10:32 AM 04/30/2023   11:05 AM 01/11/2021    1:11 PM 04/19/2020   10:46 AM  GAD 7 : Generalized Anxiety Score  Nervous, Anxious, on Edge 0 0 0 2  Control/stop worrying 0 0 0 2  Worry too much - different things 0 0 0 2  Trouble relaxing 0 0 0 3  Restless 0 0 0 1  Easily annoyed or irritable 0 0 0 1  Afraid - awful might happen 0 0 0 1  Total GAD 7 Score 0 0 0 12  Anxiety Difficulty Not difficult at all Not difficult at all  Somewhat difficult        10/29/2023   10:32 AM 04/30/2023   11:05 AM 12/04/2022    1:14 PM 07/27/2022   11:04 AM 07/06/2021    3:19 PM  Depression screen PHQ 2/9   Decreased Interest 0 0 0 0 0  Down, Depressed, Hopeless 0 0 1 0 0  PHQ - 2 Score 0 0 1 0 0  Altered sleeping 1  0 2   Tired, decreased energy 0  1 0   Change in appetite 1  0 0   Feeling bad or failure about yourself  0  1 0   Trouble concentrating 0  0 1   Moving slowly or fidgety/restless 0  0 0   Suicidal thoughts 0  0 0   PHQ-9 Score 2  3 3    Difficult doing work/chores Not difficult at all  Not difficult at all Not difficult at all     Medications: Outpatient Medications Prior to Visit  Medication Sig   ALPRAZolam  (XANAX ) 0.5 MG tablet TAKE 1/2 TO 1 TABLET BY MOUTH AT BEDTIMEAS NEEDED FOR ANXIETY   Apoaequorin (PREVAGEN) 10 MG CAPS Take 10 mg by mouth daily.   aspirin  81 MG EC tablet Take 81 mg by mouth daily.   azelastine  (OPTIVAR ) 0.05 % ophthalmic solution Place 1 drop into both eyes 2 (two) times daily. Use as ear drops   bimatoprost (LUMIGAN) 0.03 % ophthalmic solution    Cholecalciferol (VITAMIN D ) 50 MCG (2000 UT) tablet Take 2,000  Units by mouth every other day.   diltiazem  (CARDIZEM  CD) 120 MG 24 hr capsule Take 1 capsule (120 mg total) by mouth daily.   diltiazem  (CARDIZEM ) 30 MG tablet TAKE 1 TABLET BY MOUTH 3 TIMES DAILY AS NEEDED.   escitalopram  (LEXAPRO ) 10 MG tablet TAKE 1 TABLET BY MOUTH AT BEDTIME.   ezetimibe  (ZETIA ) 10 MG tablet TAKE 1 TABLET BY MOUTH ONCE DAILY   latanoprost (XALATAN) 0.005 % ophthalmic solution Place 1 drop into both eyes at bedtime.   losartan  (COZAAR ) 25 MG tablet TAKE 1 TABLET BY MOUTH DAILY.   Magnesium  500 MG CAPS Take by mouth every other day.   memantine (NAMENDA) 5 MG tablet Take 5 mg by mouth daily.   Misc Natural Products (ELDERBERRY ZINC/VIT C/IMMUNE MT) Take 1 tablet by mouth daily at 6 (six) AM. With Vitamin D    QUEtiapine (SEROQUEL) 25 MG tablet Take by mouth.   triamcinolone  cream (KENALOG ) 0.1 % Apply 1 application topically 2 (two) times daily.   vitamin B-12 (CYANOCOBALAMIN ) 1000 MCG tablet Take 1 tablet (1,000 mcg  total) by mouth daily.   vitamin C (ASCORBIC ACID) 500 MG tablet Take 500 mg by mouth daily.   No facility-administered medications prior to visit.    Review of Systems     Objective    BP 112/71 Comment: home readings  Pulse 63   Ht 5\' 2"  (1.575 m)   Wt 140 lb 14.4 oz (63.9 kg)   SpO2 100%   BMI 25.77 kg/m    Physical Exam Vitals reviewed.  Constitutional:      General: She is not in acute distress.    Appearance: Normal appearance. She is well-developed. She is not diaphoretic.  HENT:     Head: Normocephalic and atraumatic.  Eyes:     General: No scleral icterus.    Conjunctiva/sclera: Conjunctivae normal.  Neck:     Thyroid : No thyromegaly.  Cardiovascular:     Rate and Rhythm: Normal rate and regular rhythm.     Heart sounds: Normal heart sounds. No murmur heard. Pulmonary:     Effort: Pulmonary effort is normal. No respiratory distress.     Breath sounds: Normal breath sounds. No wheezing, rhonchi or rales.  Musculoskeletal:     Cervical back: Neck supple.     Right lower leg: No edema.     Left lower leg: No edema.  Lymphadenopathy:     Cervical: No cervical adenopathy.  Skin:    General: Skin is warm and dry.     Findings: No rash.  Neurological:     Mental Status: She is alert and oriented to person, place, and time. Mental status is at baseline.  Psychiatric:        Mood and Affect: Mood normal.        Behavior: Behavior normal.      No results found for any visits on 10/29/23.  Assessment & Plan     Problem List Items Addressed This Visit       Cardiovascular and Mediastinum   Aortic atherosclerosis (HCC)   Continue ASA and Zetia       Primary hypertension - Primary   Blood pressure is slightly elevated in the clinic but well-controlled at home with readings around 112/xx mmHg. - Continue losartan  25 mg daily. - Continue diltiazem  120 mg daily.        Nervous and Auditory   Dementia [facility])   Memory has improved, per patient report,  possibly due to Namenda, with acknowledgment  of its waxing and waning nature. - Continue Namenda 5 mg once daily.      Relevant Medications   memantine (NAMENDA) 5 MG tablet     Other   GAD (generalized anxiety disorder)   Anxiety is well-managed with rare use of Xanax , indicating good control. Initial anxiety and depression after moving to a new environment have improved with time and insight. - Continue Lexapro  10 mg daily. - Use Xanax  as needed for anxiety.      Hypercholesteremia   Hyperlipidemia is managed with current medication. - Continue Zetia  10 mg daily - recheck at CPE      MDD (major depressive disorder)   Depression has improved after moving to a new environment, aided by insight into the situation. - Continue current management.      Prediabetes   Prediabetes is well-managed with a low prediabetes A1c level. - Check A1c annually.           General Health Maintenance She is up to date on routine screenings and preventive health measures. - Schedule physical in six months. - Continue routine screenings as per guidelines.       Return in about 6 months (around 04/30/2024) for CPE.       Aden Agreste, MD  [facility] [phone] (phone) [phone] (fax)  [facility]

## 2023-10-29 NOTE — Assessment & Plan Note (Signed)
 Continue ASA and Zetia

## 2023-10-29 NOTE — Assessment & Plan Note (Signed)
 Blood pressure is slightly elevated in the clinic but well-controlled at home with readings around 112/xx mmHg. - Continue losartan  25 mg daily. - Continue diltiazem  120 mg daily.

## 2023-10-29 NOTE — Assessment & Plan Note (Signed)
 Prediabetes is well-managed with a low prediabetes A1c level. - Check A1c annually.

## 2023-10-30 ENCOUNTER — Encounter: Payer: Self-pay | Admitting: Plastic Surgery

## 2023-12-24 ENCOUNTER — Inpatient Hospital Stay: Payer: HMO | Attending: Oncology

## 2023-12-24 DIAGNOSIS — D472 Monoclonal gammopathy: Secondary | ICD-10-CM

## 2023-12-26 DIAGNOSIS — L821 Other seborrheic keratosis: Secondary | ICD-10-CM | POA: Diagnosis not present

## 2023-12-26 DIAGNOSIS — D225 Melanocytic nevi of trunk: Secondary | ICD-10-CM | POA: Diagnosis not present

## 2023-12-26 DIAGNOSIS — D2272 Melanocytic nevi of left lower limb, including hip: Secondary | ICD-10-CM | POA: Diagnosis not present

## 2023-12-26 DIAGNOSIS — D2271 Melanocytic nevi of right lower limb, including hip: Secondary | ICD-10-CM | POA: Diagnosis not present

## 2023-12-26 DIAGNOSIS — D2262 Melanocytic nevi of left upper limb, including shoulder: Secondary | ICD-10-CM | POA: Diagnosis not present

## 2023-12-26 DIAGNOSIS — D2261 Melanocytic nevi of right upper limb, including shoulder: Secondary | ICD-10-CM | POA: Diagnosis not present

## 2024-01-02 ENCOUNTER — Ambulatory Visit (INDEPENDENT_AMBULATORY_CARE_PROVIDER_SITE_OTHER)

## 2024-01-02 DIAGNOSIS — Z0000 Encounter for general adult medical examination without abnormal findings: Secondary | ICD-10-CM

## 2024-01-02 NOTE — Patient Instructions (Addendum)
 [name] , Thank you for taking time out of your busy schedule to complete your Annual Wellness Visit with me. I enjoyed our conversation and look forward to speaking with you again next year. I, as well as your care team,  appreciate your ongoing commitment to your health goals. Please review the following plan we discussed and let me know if I can assist you in the future.   Follow up Visits: Next Medicare AWV with our clinical staff:   01/06/25 @ 3:10 PM BY PHONE Have you seen your provider in the last 6 months (3 months if uncontrolled diabetes)? Yes   Clinician Recommendations:  Aim for 30 minutes of exercise or brisk walking, 6-8 glasses of water, and 5 servings of fruits and vegetables each day. TAKE CARE!      This is a list of the screening recommended for you and due dates:  Health Maintenance  Topic Date Due   DTaP/Tdap/Td vaccine (2 - Tdap) 06/28/2013   COVID-19 Vaccine (5 - 2024-25 season) 02/11/2023   DEXA scan (bone density measurement)  03/15/2023   Flu Shot  01/11/2024   Medicare Annual Wellness Visit  01/01/2025   Pneumococcal Vaccine for age over 93  Completed   Hepatitis C Screening  Completed   Zoster (Shingles) Vaccine  Completed   Hepatitis B Vaccine  Aged Out   HPV Vaccine  Aged Out   Meningitis B Vaccine  Aged Out   Colon Cancer Screening  Discontinued    Advanced directives: (In Chart) A copy of your advanced directives are scanned into your chart should your provider ever need it. Advance Care Planning is important because it:  [x]  Makes sure you receive the medical care that is consistent with your values, goals, and preferences  [x]  It provides guidance to your family and loved ones and reduces their decisional burden about whether or not they are making the right decisions based on your wishes.  Follow the link provided in your after visit summary or read over the paperwork we have mailed to you to help you started getting your Advance Directives  in place. If you need assistance in completing these, please reach out to us  so that we can help you!

## 2024-01-02 NOTE — Progress Notes (Signed)
 Subjective:   Isabel Gonzalez is a 78 y.o. who presents for a Medicare Wellness preventive visit.  As a reminder, Annual Wellness Visits don't include a physical exam, and some assessments may be limited, especially if this visit is performed virtually. We may recommend an in-person follow-up visit with your provider if needed.  Visit Complete: Virtual I connected with  Isabel Gonzalez on 01/02/24 by a audio enabled telemedicine application and verified that I am speaking with the correct person using two identifiers.  Patient Location: Home  Provider Location: Home Office  I discussed the limitations of evaluation and management by telemedicine. The patient expressed understanding and agreed to proceed.  Vital Signs: Because this visit was a virtual/telehealth visit, some criteria may be missing or patient reported. Any vitals not documented were not able to be obtained and vitals that have been documented are patient reported.  VideoDeclined- This patient declined Librarian, academic. Therefore the visit was completed with audio only.  Persons Participating in Visit: Patient.  AWV Questionnaire: No: Patient Medicare AWV questionnaire was not completed prior to this visit.  Cardiac Risk Factors include: advanced age (>65men, >78 women);hypertension;dyslipidemia     Objective:    There were no vitals filed for this visit. There is no height or weight on file to calculate BMI.     01/02/2024   10:21 AM 10/20/2022    9:25 AM 07/06/2021    3:22 PM 06/30/2020    3:00 PM 02/19/2019    2:03 PM 03/13/2017   11:09 AM 03/06/2017    2:00 PM  Advanced Directives  Does Patient Have a Medical Advance Directive? Yes Yes Yes Yes Yes Yes  Yes   Type of Estate agent of [place];Living will Healthcare Power of [place];Living will Healthcare Power of [employer] of [place];Living will Healthcare Power of [place];Living  will Healthcare Power of [place];Living will Healthcare Power of [place];Living will  Does patient want to make changes to medical advance directive? No - Patient declined  Yes (Inpatient - patient defers changing a medical advance directive and declines information at this time)    No - Patient declined   Copy of Healthcare Power of Attorney in Chart? Yes - validated most recent copy scanned in chart (See row information) Yes - validated most recent copy scanned in chart (See row information) No - copy requested Yes - validated most recent copy scanned in chart (See row information) Yes - validated most recent copy scanned in chart (See row information) No - copy requested       Data saved with a previous flowsheet row definition    Current Medications (verified) Outpatient Encounter Medications as of 01/02/2024  Medication Sig   ALPRAZolam  (XANAX ) 0.5 MG tablet TAKE 1/2 TO 1 TABLET BY MOUTH AT BEDTIMEAS NEEDED FOR ANXIETY   aspirin  81 MG EC tablet Take 81 mg by mouth daily.   azelastine  (OPTIVAR ) 0.05 % ophthalmic solution Place 1 drop into both eyes 2 (two) times daily. Use as ear drops   Cholecalciferol (VITAMIN D ) 50 MCG (2000 UT) tablet Take 2,000 Units by mouth every other day.   diltiazem  (CARDIZEM ) 30 MG tablet TAKE 1 TABLET BY MOUTH 3 TIMES DAILY AS NEEDED.   escitalopram  (LEXAPRO ) 10 MG tablet TAKE 1 TABLET BY MOUTH AT BEDTIME. (Patient taking differently: Take 10 mg by mouth at bedtime. TAKES PRN)   ezetimibe  (ZETIA ) 10 MG tablet TAKE 1 TABLET BY MOUTH ONCE DAILY  latanoprost (XALATAN) 0.005 % ophthalmic solution Place 1 drop into both eyes at bedtime.   losartan  (COZAAR ) 25 MG tablet TAKE 1 TABLET BY MOUTH DAILY.   memantine (NAMENDA) 5 MG tablet Take 5 mg by mouth daily.   Misc Natural Products (ELDERBERRY ZINC/VIT C/IMMUNE MT) Take 1 tablet by mouth daily at 6 (six) AM. With Vitamin D    triamcinolone  cream (KENALOG ) 0.1 % Apply 1 application topically 2 (two) times daily.    vitamin B-12 (CYANOCOBALAMIN ) 1000 MCG tablet Take 1 tablet (1,000 mcg total) by mouth daily.   vitamin C (ASCORBIC ACID) 500 MG tablet Take 500 mg by mouth daily.   Apoaequorin (PREVAGEN) 10 MG CAPS Take 10 mg by mouth daily. (Patient not taking: Reported on 01/02/2024)   bimatoprost (LUMIGAN) 0.03 % ophthalmic solution  (Patient not taking: Reported on 01/02/2024)   diltiazem  (CARDIZEM  CD) 120 MG 24 hr capsule Take 1 capsule (120 mg total) by mouth daily. (Patient not taking: Reported on 01/02/2024)   Magnesium  500 MG CAPS Take by mouth every other day. (Patient not taking: Reported on 01/02/2024)   QUEtiapine (SEROQUEL) 25 MG tablet Take by mouth. (Patient not taking: Reported on 01/02/2024)   No facility-administered encounter medications on file as of 01/02/2024.    Allergies (verified) Hydrocodone-acetaminophen , Percocet [oxycodone-acetaminophen ], and Vicodin [hydrocodone-acetaminophen ]   History: Past Medical History:  Diagnosis Date   Anemia    Anxiety    Arthritis    Cancer (HCC)    squamous cell   Constipation    Dysrhythmia    palpitations    Environmental allergies    GERD (gastroesophageal reflux disease)    History of bladder infections    Hyperlipidemia    Murmur    Urinary problem    Past Surgical History:  Procedure Laterality Date   BREAST REDUCTION SURGERY     FACIAL COSMETIC SURGERY     LAPAROTOMY N/A 02/24/2017   Procedure: EXPLORATORY LAPAROTOMY and repair small hernia;  Surgeon: Jordis Laneta FALCON, MD;  Location: [facility] ORS;  Service: General;  Laterality: N/A;   ORIF HUMERUS FRACTURE Right 07/08/2015   Procedure: OPEN REDUCTION INTERNAL FIXATION (ORIF) HUMERAL SHAFT FRACTURE with bone graft and allograft;  Surgeon: Franky Pointer, MD;  Location: [facility] OR;  Service: Orthopedics;  Laterality: Right;   SKIN SURGERY     squamous cell   Family History  Problem Relation Age of Onset   Stroke Mother 22   Diabetes Brother    Stroke Brother    Social History    Socioeconomic History   Marital status: Widowed    Spouse name: Not on file   Number of children: 3   Years of education: Not on file   Highest education level: Some college, no degree  Occupational History   Occupation: retired  Tobacco Use   Smoking status: Former    Current packs/day: 0.00    Average packs/day: 0.3 packs/day for 25.0 years (6.3 ttl pk-yrs)    Types: Cigarettes    Start date: 06/11/1965    Quit date: 06/11/1990    Years since quitting: 33.5   Smokeless tobacco: Never  Vaping Use   Vaping status: Never Used  Substance and Sexual Activity   Alcohol use: Yes    Alcohol/week: 7.0 - 11.0 standard drinks of alcohol    Types: 7 - 11 Glasses of wine per week    Comment: 1-1.5 daily   Drug use: No   Sexual activity: Not on file  Other Topics Concern   Not  on file  Social History Narrative   Regular exercise         Social Drivers of Health   Financial Resource Strain: Low Risk  (01/02/2024)   Overall Financial Resource Strain (CARDIA)    Difficulty of Paying Living Expenses: Not hard at all  Food Insecurity: No Food Insecurity (01/02/2024)   Hunger Vital Sign    Worried About Running Out of Food in the Last Year: Never true    Ran Out of Food in the Last Year: Never true  Transportation Needs: No Transportation Needs (01/02/2024)   PRAPARE - Administrator, Civil Service (Medical): No    Lack of Transportation (Non-Medical): No  Physical Activity: Sufficiently Active (01/02/2024)   Exercise Vital Sign    Days of Exercise per Week: 6 days    Minutes of Exercise per Session: 60 min  Stress: No Stress Concern Present (01/02/2024)   [employer] of Occupational Health - Occupational Stress Questionnaire    Feeling of Stress: Not at all  Social Connections: Socially Isolated (01/02/2024)   Social Connection and Isolation Panel    Frequency of Communication with Friends and Family: More than three times a week    Frequency of Social Gatherings  with Friends and Family: More than three times a week    Attends Religious Services: Never    Database administrator or Organizations: No    Attends Banker Meetings: Never    Marital Status: Widowed    Tobacco Counseling Counseling given: Not Answered    Clinical Intake:  Pre-visit preparation completed: Yes  Pain : No/denies pain     BMI - recorded: 25.6 Nutritional Status: BMI 25 -29 Overweight Nutritional Risks: None Diabetes: No  Lab Results  Component Value Date   HGBA1C 5.9 (H) 04/30/2023   HGBA1C 5.8 (H) 07/27/2022   HGBA1C 5.7 (H) 01/11/2021     How often do you need to have someone help you when you read instructions, pamphlets, or other written materials from your doctor or pharmacy?: 1 - Never  Interpreter Needed?: No  Information entered by :: JHONNIE DAS, LPN   Activities of Daily Living     01/02/2024   10:22 AM  In your present state of health, do you have any difficulty performing the following activities:  Hearing? 0  Vision? 0  Difficulty concentrating or making decisions? 0  Walking or climbing stairs? 0  Dressing or bathing? 0  Doing errands, shopping? 0  Preparing Food and eating ? N  Using the Toilet? N  In the past six months, have you accidently leaked urine? N  Do you have problems with loss of bowel control? N  Managing your Medications? N  Managing your Finances? N  Housekeeping or managing your Housekeeping? N    Patient Care Team: Myrla Jon HERO, MD as PCP - General (Family Medicine) Perla Evalene PARAS, MD as PCP - Cardiology (Cardiology) Perla Evalene PARAS, MD as Consulting Physician (Cardiology) Isenstein, Arin L, MD as Consulting Physician (Dermatology) Lynell Anes, OD (Optometry) Babara Call, MD as Consulting Physician (Oncology) Elma Zachary RAMAN ([facility])  I have updated your Care Teams any recent Medical Services you may have received from other providers in the past year.     Assessment:    This is a routine wellness examination for Isabel Gonzalez.  Hearing/Vision screen Hearing Screening - Comments:: NO AIDS  Vision Screening - Comments:: LENS IMPLANTS- NO GLASSES- [facility]   Goals Addressed  This Visit's Progress    DIET - REDUCE SALT INTAKE TO 2 GRAMS PER DAY OR LESS         Depression Screen     01/02/2024   10:19 AM 10/29/2023   10:32 AM 04/30/2023   11:05 AM 04/12/2023    2:16 PM 12/04/2022    1:14 PM 07/27/2022   11:04 AM 07/06/2021    3:19 PM  PHQ 2/9 Scores  PHQ - 2 Score 0 0 0  1 0 0  PHQ- 9 Score 0 2   3 3    Exception Documentation    Patient refusal       Fall Risk     01/02/2024   10:22 AM 04/30/2023   11:05 AM 12/04/2022    1:14 PM 07/27/2022   11:04 AM 07/06/2021    3:24 PM  Fall Risk   Falls in the past year? 0 0 0 0 1  Number falls in past yr: 0 0  0 0  Injury with Fall? 0 0 0 0 0  Risk for fall due to : No Fall Risks No Fall Risks No Fall Risks No Fall Risks History of fall(s)  Follow up Falls evaluation completed;Falls prevention discussed Falls evaluation completed  Falls evaluation completed Falls prevention discussed      Data saved with a previous flowsheet row definition    MEDICARE RISK AT HOME:  Medicare Risk at Home Any stairs in or around the home?: Yes If so, are there any without handrails?: No Home free of loose throw rugs in walkways, pet beds, electrical cords, etc?: Yes Adequate lighting in your home to reduce risk of falls?: Yes Life alert?: Yes Use of a cane, walker or w/c?: No Grab bars in the bathroom?: Yes Shower chair or bench in shower?: No Elevated toilet seat or a handicapped toilet?: Yes  TIMED UP AND GO:  Was the test performed?  No  Cognitive Function: 6CIT completed        01/02/2024   10:24 AM 07/27/2022   11:03 AM  6CIT Screen  What Year? 0 points 0 points  What month? 0 points 0 points  What time? 0 points 0 points  Count back from 20 0 points 0 points  Months in reverse 0  points 0 points  Repeat phrase 2 points 2 points  Total Score 2 points 2 points    Immunizations Immunization History  Administered Date(s) Administered   Fluad Quad(high Dose 65+) 03/08/2021   Influenza Split 03/29/2009   Influenza, High Dose Seasonal PF 03/24/2020, 03/21/2022   Influenza-Unspecified 03/20/2018, 02/12/2019, 03/08/2021, 03/15/2023   PFIZER(Purple Top)SARS-COV-2 Vaccination 06/18/2019, 07/09/2019, 04/12/2020   Pfizer Covid-19 Vaccine Bivalent Booster 93yrs & up 04/03/2021   Pneumococcal Conjugate-13 12/10/2013   Pneumococcal Polysaccharide-23 06/02/2010   Respiratory Syncytial Virus Vaccine,Recomb Aduvanted(Arexvy) 03/03/2022   Td 06/29/2003   Zoster Recombinant(Shingrix) 09/07/2017, 01/22/2018   Zoster, Live 04/18/2006    Screening Tests Health Maintenance  Topic Date Due   DTaP/Tdap/Td (2 - Tdap) 06/28/2013   COVID-19 Vaccine (5 - 2024-25 season) 02/11/2023   DEXA SCAN  03/15/2023   INFLUENZA VACCINE  01/11/2024   Medicare Annual Wellness (AWV)  01/01/2025   Pneumococcal Vaccine: 50+ Years  Completed   Hepatitis C Screening  Completed   Zoster Vaccines- Shingrix  Completed   Hepatitis B Vaccines  Aged Out   HPV VACCINES  Aged Out   Meningococcal B Vaccine  Aged Out   Colonoscopy  Discontinued    Health Maintenance  Health Maintenance Due  Topic Date Due   DTaP/Tdap/Td (2 - Tdap) 06/28/2013   COVID-19 Vaccine (5 - 2024-25 season) 02/11/2023   DEXA SCAN  03/15/2023   Health Maintenance Items Addressed: DECLINES BDS; AGED OUT OF MAMMOGRAM & COLONOSCOPY; NEEDS TDAP & PNA & COVID  Additional Screening:  Vision Screening: Recommended annual ophthalmology exams for early detection of glaucoma and other disorders of the eye. Would you like a referral to an eye doctor? No    Dental Screening: Recommended annual dental exams for proper oral hygiene  Community Resource Referral / Chronic Care Management: CRR required this visit?  No   CCM required  this visit?  No   Plan:    I have personally reviewed and noted the following in the patient's chart:   Medical and social history Use of alcohol, tobacco or illicit drugs  Current medications and supplements including opioid prescriptions. Patient is not currently taking opioid prescriptions. Functional ability and status Nutritional status Physical activity Advanced directives List of other physicians Hospitalizations, surgeries, and ER visits in previous 12 months Vitals Screenings to include cognitive, depression, and falls Referrals and appointments  In addition, I have reviewed and discussed with patient certain preventive protocols, quality metrics, and best practice recommendations. A written personalized care plan for preventive services as well as general preventive health recommendations were provided to patient.   Jhonnie GORMAN Das, LPN   [id number]   After Visit Summary: (MyChart) Due to this being a telephonic visit, the after visit summary with patients personalized plan was offered to patient via MyChart   Notes: Nothing significant to report at this time.

## 2024-01-03 ENCOUNTER — Inpatient Hospital Stay: Payer: HMO | Admitting: Oncology

## 2024-01-03 NOTE — Assessment & Plan Note (Deleted)
 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 Less frequently, these patients progress to AL amyloidosis, light chain deposition disease, or another lymphoproliferative disorder. Lab Results  Component Value Date   MPROTEIN Not Observed 06/18/2023   KPAFRELGTCHN 15.4 06/18/2023   LAMBDASER 12.4 06/18/2023   KAPLAMBRATIO 1.24 06/18/2023     M protein is not detectable. IFE showed lamda MGUS For now I recommend observation.

## 2024-01-08 ENCOUNTER — Other Ambulatory Visit: Payer: Self-pay | Admitting: Family Medicine

## 2024-01-16 DIAGNOSIS — Z1331 Encounter for screening for depression: Secondary | ICD-10-CM | POA: Diagnosis not present

## 2024-01-16 DIAGNOSIS — G309 Alzheimer's disease, unspecified: Secondary | ICD-10-CM | POA: Diagnosis not present

## 2024-01-16 DIAGNOSIS — F0280 Dementia in other diseases classified elsewhere without behavioral disturbance: Secondary | ICD-10-CM | POA: Diagnosis not present

## 2024-01-16 DIAGNOSIS — M5481 Occipital neuralgia: Secondary | ICD-10-CM | POA: Diagnosis not present

## 2024-03-27 ENCOUNTER — Other Ambulatory Visit: Payer: Self-pay | Admitting: Family Medicine

## 2024-03-27 DIAGNOSIS — F411 Generalized anxiety disorder: Secondary | ICD-10-CM

## 2024-04-08 ENCOUNTER — Ambulatory Visit: Attending: Cardiology | Admitting: Cardiology

## 2024-04-08 ENCOUNTER — Encounter: Payer: Self-pay | Admitting: Cardiology

## 2024-04-08 VITALS — BP 130/68 | HR 74 | Ht 62.0 in | Wt 145.4 lb

## 2024-04-08 DIAGNOSIS — I493 Ventricular premature depolarization: Secondary | ICD-10-CM | POA: Diagnosis not present

## 2024-04-08 DIAGNOSIS — I10 Essential (primary) hypertension: Secondary | ICD-10-CM | POA: Diagnosis not present

## 2024-04-08 DIAGNOSIS — E785 Hyperlipidemia, unspecified: Secondary | ICD-10-CM

## 2024-04-08 DIAGNOSIS — I700 Atherosclerosis of aorta: Secondary | ICD-10-CM | POA: Diagnosis not present

## 2024-04-08 MED ORDER — DILTIAZEM HCL 30 MG PO TABS: ORAL_TABLET | ORAL | 3 refills | Status: AC

## 2024-04-08 MED ORDER — LOSARTAN POTASSIUM 25 MG PO TABS: 25.0000 mg | ORAL_TABLET | Freq: Every day | ORAL | 3 refills | Status: AC

## 2024-04-08 MED ORDER — EZETIMIBE 10 MG PO TABS: 10.0000 mg | ORAL_TABLET | Freq: Every day | ORAL | 3 refills | Status: AC

## 2024-04-08 NOTE — Progress Notes (Signed)
 Cardiology Office Note   Date:  04/08/2024  ID:  Isabel Gonzalez, DOB September 08, 1945, MRN [id number] PCP: Myrla Jon HERO, MD  [facility] Providers Cardiologist:  Evalene Lunger, MD Cardiology APP:  Gerard Frederick, NP     History of Present Illness Isabel Gonzalez is a 78 y.o. 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  Patient was previously evaluated by Dr. Gollan in August 2023.  At that time she was doing well.  Blood pressure had been elevated for a couple of days due to some increase stress was affecting health.  She was tolerating her diltiazem  with no episodes of near syncope or orthostasis.  There were no medication changes that were made and no further testing that was ordered.  She called the office in May 2024 stating that her neurologist had requested an EKG completed.  She was last seen in clinic in June 2024 states she had been doing well.  She was unable to recall why she was advised that she did not have a follow-up EKG but her neurology thought it was due to facial numbness that had resolved.  Her EKG revealed sinus rhythm with a rate of 60 with no acute changes from prior study.  She returns to clinic today stating that she has been doing well from a cardiac perspective.  She has not required the use of any of her as needed diltiazem .  She has several questions today related to what different medications are needed for her.  She states that she has upcoming appointment with her PCP.  Denies any hospitalizations or visits to the emergency department.  ROS: 10 point review of system has been reviewed and considered negative the exception was been listed in the HPI  Studies Reviewed EKG  Interpretation Date/Time:  Tuesday April 08 2024 13:50:24 EDT Ventricular Rate:  74 PR Interval:  140 QRS Duration:  84 QT Interval:  380 QTC Calculation: 421 R Axis:   -25  Text Interpretation: Normal sinus rhythm Low voltage QRS When compared with ECG of 23-Feb-2017 14:08, No significant change was found Confirmed by Gerard Frederick ([id number]) on 04/08/2024 1:53:50 PM    Risk Assessment/Calculations           Physical Exam VS:  BP 130/68 (BP Location: Left Arm, Patient Position: Sitting, Cuff Size: Normal)   Pulse 74   Ht 5' 2 (1.575 m)   Wt 145 lb 6 oz (65.9 kg)   SpO2 99%   BMI 26.59 kg/m        Wt Readings from Last 3 Encounters:  04/08/24 145 lb 6 oz (65.9 kg)  10/29/23 140 lb 14.4 oz (63.9 kg)  06/28/23 139 lb 12.8 oz (63.4 kg)    GEN: Well nourished, well developed in no acute distress NECK: No JVD; No carotid bruits CARDIAC: RRR, no murmurs, rubs, gallops RESPIRATORY:  Clear to auscultation without rales, wheezing or rhonchi  ABDOMEN: Soft, non-tender, non-distended EXTREMITIES:  No edema; No deformity   ASSESSMENT AND PLAN Primary hypertension with a blood pressure today of 130/68.  Blood pressure has been well-controlled on her current regimen of diltiazem  120 mg daily and losartan  25 mg daily.  She does require refills of both of these medications sent to the pharmacy of choice today.  She has been encouraged to continue  to monitor pressures 1 to 2 hours postmedication administration as well.  Pure hypercholesterolemia she has been continued on ezetimibe  10 mg daily.  Previous LDL was 128.  She has a blood work scheduled with her primary care provider.  History of tachypalpitations with recurrent PVCs that have been managed on diltiazem  120 mg daily and as needed diltiazem  30 mg up to 3 times daily as needed.  She has not required the use of any of her as needed doses.  EKG today reveals sinus rhythm with a rate of 74 with no ischemic changes noted.  Mild to  moderate aortic atherosclerosis noted on CT scan.  She is continued on aspirin  81 mg daily and ezetimibe  10 mg daily.  Upcoming labs with PCP.  History of TIA with ongoing management per neurology.  She is continued on aspirin  and ezetimibe .  She also continues to have problems with her memory that are being managed by neurology per her account.       Dispo: Patient to return to clinic to see MD/APP in 11 to 12 months or sooner if needed for further evaluation  Signed, Shameka Aggarwal, NP

## 2024-04-08 NOTE — Patient Instructions (Signed)
 Medication Instructions:  Your physician recommends that you continue on your current medications as directed. Please refer to the Current Medication list given to you today.   *If you need a refill on your cardiac medications before your next appointment, please call your pharmacy*  Lab Work: No labs ordered today  If you have labs (blood work) drawn today and your tests are completely normal, you will receive your results only by: MyChart Message (if you have MyChart) OR A paper copy in the mail If you have any lab test that is abnormal or we need to change your treatment, we will call you to review the results.  Testing/Procedures: No test ordered today   Follow-Up: At [facility], you and your health needs are our priority.  As part of our continuing mission to provide you with exceptional heart care, our providers are all part of one team.  This team includes your primary Cardiologist (physician) and Advanced Practice Providers or APPs (Physician Assistants and Nurse Practitioners) who all work together to provide you with the care you need, when you need it.  Your next appointment:   12 month(s)  Provider:   You may see Timothy Gollan, MD or one of the following Advanced Practice Providers on your designated Care Team:   Tylene Lunch, NP   We recommend signing up for the patient portal called MyChart.  Sign up information is provided on this After Visit Summary.  MyChart is used to connect with patients for Virtual Visits (Telemedicine).  Patients are able to view lab/test results, encounter notes, upcoming appointments, etc.  Non-urgent messages can be sent to your provider as well.   To learn more about what you can do with MyChart, go to [email or web address].

## 2024-05-01 ENCOUNTER — Ambulatory Visit (INDEPENDENT_AMBULATORY_CARE_PROVIDER_SITE_OTHER): Admitting: Family Medicine

## 2024-05-01 ENCOUNTER — Encounter: Payer: Self-pay | Admitting: Family Medicine

## 2024-05-01 VITALS — BP 118/65 | HR 81 | Ht 62.0 in | Wt 141.9 lb

## 2024-05-01 DIAGNOSIS — I10 Essential (primary) hypertension: Secondary | ICD-10-CM

## 2024-05-01 DIAGNOSIS — F3341 Major depressive disorder, recurrent, in partial remission: Secondary | ICD-10-CM

## 2024-05-01 DIAGNOSIS — R7303 Prediabetes: Secondary | ICD-10-CM

## 2024-05-01 DIAGNOSIS — F411 Generalized anxiety disorder: Secondary | ICD-10-CM

## 2024-05-01 DIAGNOSIS — F5101 Primary insomnia: Secondary | ICD-10-CM | POA: Diagnosis not present

## 2024-05-01 DIAGNOSIS — Z0000 Encounter for general adult medical examination without abnormal findings: Secondary | ICD-10-CM | POA: Diagnosis not present

## 2024-05-01 DIAGNOSIS — E7800 Pure hypercholesterolemia, unspecified: Secondary | ICD-10-CM

## 2024-05-01 MED ORDER — ESCITALOPRAM OXALATE 10 MG PO TABS: 10.0000 mg | ORAL_TABLET | Freq: Every day | ORAL | 1 refills | Status: AC

## 2024-05-01 NOTE — Assessment & Plan Note (Signed)
 Cholesterol levels to be checked as part of routine labs. - Ordered cholesterol labs.

## 2024-05-01 NOTE — Assessment & Plan Note (Signed)
 Currently managed with escitalopram  (Lexapro ). Reports medication is almost gone and requires a refill. - Refilled escitalopram  prescription.

## 2024-05-01 NOTE — Progress Notes (Signed)
 Complete physical exam   Patient: Isabel Gonzalez   DOB: 1946-02-27   78 y.o. Female  MRN: [id number] Visit Date: 05/01/2024  Today's healthcare provider: Jon Eva, MD   Chief Complaint  Patient presents with   Annual Exam    Last completed AWV completed Diet -  General Exercise - walking four times a week for thirty minutes Feeling - well Sleeping - poorly Concerns - none    Subjective    Isabel Gonzalez is a 78 y.o. female who presents today for a complete physical exam.   Discussed the use of AI scribe software for clinical note transcription with the patient, who gave verbal consent to proceed.  History of Present Illness   Isabel Gonzalez is a 78 year old female who presents for an annual physical exam.  She experiences difficulty sleeping without any identifiable cause and occasionally uses Aleve PM. Her current medications include escitalopram  for mood and anxiety, which is effective, but she is almost out of her prescription. She takes a small red pill, likely Namenda, prescribed by neurology, and is currently out of it.  She received her flu shot at the end of September and a COVID shot in October. She is unsure about her tetanus vaccination status and believes she has not received one in the last ten years.  She has a history of eye implants with improved vision and no longer requires readers. She uses Vaseline for a dry, itchy ear.        Last depression screening scores    05/01/2024    1:48 PM 01/02/2024   10:19 AM 10/29/2023   10:32 AM  PHQ 2/9 Scores  PHQ - 2 Score 0 0 0  PHQ- 9 Score 3 0  2      Data saved with a previous flowsheet row definition   Last fall risk screening    01/02/2024   10:22 AM  Fall Risk   Falls in the past year? 0  Number falls in past yr: 0  Injury with Fall? 0  Risk for fall due to : No Fall Risks  Follow up Falls evaluation completed;Falls prevention discussed         Medications: Outpatient Medications Prior to Visit  Medication Sig   aspirin  81 MG EC tablet Take 81 mg by mouth daily.   azelastine  (OPTIVAR ) 0.05 % ophthalmic solution Place 1 drop into both eyes 2 (two) times daily. Use as ear drops   BIOTIN PO Take by mouth daily.   Cholecalciferol (VITAMIN D ) 50 MCG (2000 UT) tablet Take 2,000 Units by mouth every other day.   diltiazem  (CARDIZEM ) 30 MG tablet TAKE 1 TABLET BY MOUTH 3 TIMES DAILY AS NEEDED.   ezetimibe  (ZETIA ) 10 MG tablet Take 1 tablet (10 mg total) by mouth daily.   Ibuprofen-diphenhydrAMINE  Cit (ADVIL PM PO) Take by mouth at bedtime.   latanoprost (XALATAN) 0.005 % ophthalmic solution Place 1 drop into both eyes at bedtime.   losartan  (COZAAR ) 25 MG tablet Take 1 tablet (25 mg total) by mouth daily.   memantine (NAMENDA) 5 MG tablet Take 5 mg by mouth daily.   QUEtiapine (SEROQUEL) 25 MG tablet Take by mouth.   vitamin B-12 (CYANOCOBALAMIN ) 1000 MCG tablet Take 1 tablet (1,000 mcg total) by mouth daily.   vitamin C (ASCORBIC ACID) 500 MG tablet Take 500 mg by mouth daily.   [DISCONTINUED] escitalopram  (LEXAPRO ) 10 MG tablet TAKE 1 TABLET BY MOUTH  AT BEDTIME.   ALPRAZolam  (XANAX ) 0.5 MG tablet TAKE 1/2 TO 1 TABLET BY MOUTH AT BEDTIMEAS NEEDED FOR ANXIETY   [DISCONTINUED] Apoaequorin (PREVAGEN) 10 MG CAPS Take 10 mg by mouth daily. (Patient not taking: Reported on 05/01/2024)   [DISCONTINUED] bimatoprost (LUMIGAN) 0.03 % ophthalmic solution  (Patient not taking: Reported on 05/01/2024)   [DISCONTINUED] diltiazem  (CARDIZEM  CD) 120 MG 24 hr capsule Take 1 capsule (120 mg total) by mouth daily. (Patient not taking: Reported on 05/01/2024)   [DISCONTINUED] Magnesium  500 MG CAPS Take by mouth every other day. (Patient not taking: Reported on 05/01/2024)   [DISCONTINUED] Misc Natural Products (ELDERBERRY ZINC/VIT C/IMMUNE MT) Take 1 tablet by mouth daily at 6 (six) AM. With Vitamin D  (Patient not taking: Reported on 05/01/2024)    [DISCONTINUED] triamcinolone  cream (KENALOG ) 0.1 % Apply 1 application topically 2 (two) times daily. (Patient not taking: Reported on 05/01/2024)   No facility-administered medications prior to visit.    Review of Systems    Objective    BP 118/65 (BP Location: Left Arm, Patient Position: Sitting, Cuff Size: Normal)   Pulse 81   Ht 5' 2 (1.575 m)   Wt 141 lb 14.4 oz (64.4 kg)   SpO2 98%   BMI 25.95 kg/m    Physical Exam Vitals reviewed.  Constitutional:      General: She is not in acute distress.    Appearance: Normal appearance. She is well-developed. She is not diaphoretic.  HENT:     Head: Normocephalic and atraumatic.     Right Ear: Tympanic membrane, ear canal and external ear normal.     Left Ear: Tympanic membrane, ear canal and external ear normal.     Nose: Nose normal.     Mouth/Throat:     Mouth: Mucous membranes are moist.     Pharynx: Oropharynx is clear. No oropharyngeal exudate.  Eyes:     General: No scleral icterus.    Conjunctiva/sclera: Conjunctivae normal.     Pupils: Pupils are equal, round, and reactive to light.  Neck:     Thyroid : No thyromegaly.  Cardiovascular:     Rate and Rhythm: Normal rate and regular rhythm.     Heart sounds: Normal heart sounds. No murmur heard. Pulmonary:     Effort: Pulmonary effort is normal. No respiratory distress.     Breath sounds: Normal breath sounds. No wheezing or rales.  Abdominal:     General: There is no distension.     Palpations: Abdomen is soft.     Tenderness: There is no abdominal tenderness.  Musculoskeletal:        General: No deformity.     Cervical back: Neck supple.     Right lower leg: No edema.     Left lower leg: No edema.  Lymphadenopathy:     Cervical: No cervical adenopathy.  Skin:    General: Skin is warm and dry.     Findings: No rash.  Neurological:     Mental Status: She is alert. Mental status is at baseline.     Gait: Gait normal.  Psychiatric:        Mood and Affect:  Mood normal.        Behavior: Behavior normal.        Thought Content: Thought content normal.      No results found for any visits on 05/01/24.  Assessment & Plan    Routine Health Maintenance and Physical Exam  Exercise Activities and Dietary recommendations  Goals  DIET - EAT MORE FRUITS AND VEGETABLES     DIET - INCREASE WATER INTAKE     Recommend to drink at least 6-8 8oz glasses of water per day.     DIET - REDUCE SALT INTAKE TO 2 GRAMS PER DAY OR LESS        Immunization History  Administered Date(s) Administered   Fluad Quad(high Dose 65+) 03/08/2021   INFLUENZA, HIGH DOSE SEASONAL PF 03/24/2020, 03/21/2022, 03/15/2023   Influenza Split 03/29/2009   Influenza-Unspecified 03/20/2018, 02/12/2019, 03/08/2021, 03/22/2022, 03/15/2023, 03/06/2024   Moderna Covid-19 Fall Seasonal Vaccine 60yrs & older 03/23/2022, 03/25/2024   PFIZER(Purple Top)SARS-COV-2 Vaccination 06/18/2019, 07/09/2019, 04/12/2020   Pfizer Covid-19 Vaccine Bivalent Booster 61yrs & up 04/03/2021   Pneumococcal Conjugate-13 12/10/2013   Pneumococcal Polysaccharide-23 06/02/2010   Respiratory Syncytial Virus Vaccine,Recomb Aduvanted(Arexvy) 03/03/2022   Td 06/29/2003   Zoster Recombinant(Shingrix) 09/07/2017, 01/22/2018   Zoster, Live 04/18/2006, 01/22/2018    Health Maintenance  Topic Date Due   DTaP/Tdap/Td (2 - Tdap) 06/28/2013   COVID-19 Vaccine (7 - Pfizer risk 2025-26 season) 09/23/2024   Medicare Annual Wellness (AWV)  01/01/2025   Pneumococcal Vaccine: 50+ Years  Completed   Influenza Vaccine  Completed   Bone Density Scan  Completed   Hepatitis C Screening  Completed   Zoster Vaccines- Shingrix  Completed   Meningococcal B Vaccine  Aged Out   Colonoscopy  Discontinued    Discussed health benefits of physical activity, and encouraged her to engage in regular exercise appropriate for her age and condition.  Problem List Items Addressed This Visit       Cardiovascular and  Mediastinum   Primary hypertension   Blood pressure is well-controlled.      Relevant Orders   Comprehensive metabolic panel with GFR     Other   GAD (generalized anxiety disorder)   Currently managed with escitalopram  (Lexapro ). Reports medication is almost gone and requires a refill. - Refilled escitalopram  prescription.       Relevant Medications   escitalopram  (LEXAPRO ) 10 MG tablet   Hypercholesteremia   Cholesterol levels to be checked as part of routine labs. - Ordered cholesterol labs.      Relevant Orders   Comprehensive metabolic panel with GFR   Lipid panel   MDD (major depressive disorder)   Currently managed with escitalopram  (Lexapro ). Reports medication is almost gone and requires a refill. - Refilled escitalopram  prescription.       Relevant Medications   escitalopram  (LEXAPRO ) 10 MG tablet   Prediabetes   A1c to be checked as part of routine labs to monitor blood sugar levels. - Ordered A1c lab.      Relevant Orders   Hemoglobin A1c   Primary insomnia   Chronic insomnia with difficulty sleeping. Currently using Aleve PM, which is not recommended due to potential memory issues and confusion. Discussed alternative options for sleep improvement. - Recommended melatonin, child's dose, as needed. - Recommended magnesium  glycinate nightly for relaxation and sleep. - Advised to avoid Aleve PM due to potential side effects.      Other Visit Diagnoses       Encounter for annual physical exam    -  Primary   Relevant Orders   Hemoglobin A1c   Comprehensive metabolic panel with GFR   Lipid panel           Adult Wellness Visit Routine wellness visit with no acute concerns. Blood pressure is well-controlled. Discussed vaccinations and screenings. - Ordered labs for cholesterol, kidney and  liver function, and A1c. - Advised to obtain tetanus shot after Thanksgiving. - Confirmed COVID and flu vaccinations are up to date. - Discussed bone density scan;  no further scans needed as she is not on osteoporosis medication and will not be willing to start one regardless of DEXA results       Return in about 6 months (around 10/29/2024) for chronic disease f/u.     Jon Eva, MD  [facility] [phone] (phone) [phone] (fax)  [facility]

## 2024-05-01 NOTE — Assessment & Plan Note (Signed)
 Chronic insomnia with difficulty sleeping. Currently using Aleve PM, which is not recommended due to potential memory issues and confusion. Discussed alternative options for sleep improvement. - Recommended melatonin, child's dose, as needed. - Recommended magnesium  glycinate nightly for relaxation and sleep. - Advised to avoid Aleve PM due to potential side effects.

## 2024-05-01 NOTE — Assessment & Plan Note (Signed)
 Blood pressure is well controlled

## 2024-05-01 NOTE — Assessment & Plan Note (Signed)
 A1c to be checked as part of routine labs to monitor blood sugar levels. - Ordered A1c lab.

## 2024-05-02 LAB — COMPREHENSIVE METABOLIC PANEL WITH GFR
ALT: 25 IU/L (ref 0–32)
AST: 23 IU/L (ref 0–40)
Albumin: 4.5 g/dL (ref 3.8–4.8)
Alkaline Phosphatase: 88 IU/L (ref 49–135)
BUN/Creatinine Ratio: 27 (ref 12–28)
BUN: 21 mg/dL (ref 8–27)
Bilirubin Total: 0.6 mg/dL (ref 0.0–1.2)
CO2: 21 mmol/L (ref 20–29)
Calcium: 10.4 mg/dL — ABNORMAL HIGH (ref 8.7–10.3)
Chloride: 99 mmol/L (ref 96–106)
Creatinine, Ser: 0.79 mg/dL (ref 0.57–1.00)
Globulin, Total: 2.3 g/dL (ref 1.5–4.5)
Glucose: 98 mg/dL (ref 70–99)
Potassium: 4.6 mmol/L (ref 3.5–5.2)
Sodium: 135 mmol/L (ref 134–144)
Total Protein: 6.8 g/dL (ref 6.0–8.5)
eGFR: 76 mL/min/1.73 (ref >59)

## 2024-05-02 LAB — LIPID PANEL
Chol/HDL Ratio: 3.2 ratio (ref 0.0–4.4)
Cholesterol, Total: 214 mg/dL — ABNORMAL HIGH (ref 100–199)
HDL: 67 mg/dL (ref >39)
LDL Chol Calc (NIH): 109 mg/dL — ABNORMAL HIGH (ref 0–99)
Triglycerides: 221 mg/dL — ABNORMAL HIGH (ref 0–149)
VLDL Cholesterol Cal: 38 mg/dL (ref 5–40)

## 2024-05-02 LAB — HEMOGLOBIN A1C
Est. average glucose Bld gHb Est-mCnc: 105 mg/dL
Hgb A1c MFr Bld: 5.3 % (ref 4.8–5.6)

## 2024-10-30 ENCOUNTER — Ambulatory Visit: Admitting: Family Medicine

## 2025-01-06 ENCOUNTER — Ambulatory Visit
